# Patient Record
Sex: Female | Born: 1965 | Smoking: Former smoker
Health system: Northeastern US, Community
[De-identification: ages and names within clinical notes are randomized; demographics above are authoritative.]

## PROBLEM LIST (undated history)

## (undated) DIAGNOSIS — H524 Presbyopia: Secondary | ICD-10-CM

## (undated) DIAGNOSIS — E119 Type 2 diabetes mellitus without complications: Secondary | ICD-10-CM

## (undated) DIAGNOSIS — I10 Essential (primary) hypertension: Secondary | ICD-10-CM

## (undated) DIAGNOSIS — Z8679 Personal history of other diseases of the circulatory system: Secondary | ICD-10-CM

## (undated) DIAGNOSIS — H52203 Unspecified astigmatism, bilateral: Secondary | ICD-10-CM

## (undated) DIAGNOSIS — Z8639 Personal history of other endocrine, nutritional and metabolic disease: Secondary | ICD-10-CM

## (undated) HISTORY — PX: TOE SURGERY: SHX1073

## (undated) HISTORY — PX: OB ANTEPARTUM CARE CESAREAN DLVR & POSTPARTUM: REP299

## (undated) HISTORY — PX: LAPAROSCOPY FULGURATION OVIDUCTS: REP209

## (undated) HISTORY — DX: Presbyopia: H52.4

## (undated) HISTORY — DX: Unspecified astigmatism, bilateral: H52.203

## (undated) HISTORY — PX: TUBAL LIGATION: SHX77

## (undated) HISTORY — PX: PARATHYROIDECTOMY: SHX19

## (undated) HISTORY — PX: CARPAL TUNNEL RELEASE: 100041

---
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
## 2009-07-08 ENCOUNTER — Ambulatory Visit (HOSPITAL_BASED_OUTPATIENT_CLINIC_OR_DEPARTMENT_OTHER): Payer: PRIVATE HEALTH INSURANCE | Admitting: Advanced Practice Midwife

## 2009-07-08 VITALS — BP 158/100 | Wt 201.0 lb

## 2009-07-08 DIAGNOSIS — Z124 Encounter for screening for malignant neoplasm of cervix: Secondary | ICD-10-CM

## 2009-07-08 DIAGNOSIS — Z01419 Encounter for gynecological examination (general) (routine) without abnormal findings: Principal | ICD-10-CM

## 2009-07-08 DIAGNOSIS — Z1239 Encounter for other screening for malignant neoplasm of breast: Secondary | ICD-10-CM

## 2009-07-08 NOTE — Progress Notes (Signed)
Due to the language barrier, the office visit was conducted in Portuguese with an interpreter. The interpreter was present during the entire visit.

## 2009-07-08 NOTE — Progress Notes (Signed)
S:  44 y.o. Portuguese-speaking pt presents for AE.  Interpreter present throughout visit.    Feeling well.  Reports he last medical care was ~6 yrs ago.  Reports she has HAs before/after menses, relieved with Tylenol.  She has also had constant L knee pain x 1 yr, somewhat improved with Tylenol.  No clear aggravating factors.  Also periodically experiences numbness in hands, which is relieved by repositioning.      Hx reviewed; see Histories sections in EpicCare.  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 but now only L knee hurts, no meds  Surg hx: umbilical hernia, c/s x 2, BTL, no probs with anesthesia  Social hx: works as Financial trader, trying to work less, denies toxic habits  Nutrition: eats "junk food" because of work schedule  Exercise: none, blames her age, used to go to gym  Mood: fine, misses family and children in [place], sleeping OK, has fun dancing    G2 P2  LNMP March 2011, has monthly menses, no intramenstrual bleeding  No hx abnl pap, last remote  Contraception: had BTL  Relationship: stable, MM relationship x 3 years  STI screening: no concerns  DV: denies    ROS:  Constitutional: fatigue  Endocrine: negative  Dermatologic: negative  Cardiovascular: negative  Respiratory: negative  Gastrointestinal: negative  Genitourinary: negative  Musculoskeletal: joint pain  Neurological: negative  Psychiatric: negative    O:      Repeat BP 148/100    PHYSICAL EXAM:  Constitutional: well developed, well nourished [place] female  Skin: clear  Neurological: normal,alert and oriented  Lymphatic: no cervical, supraclavicular, axillary or inguinal adenopathy noted  Thyroid: not enlarged and no palpable masses  Chest: clear to auscultation  Heart: regular rate and rhythm and no murmur  Breasts: no masses, skin, nipple or axillary changes and nipples everted  Abdomen: no masses or tenderness, no palpable hepatosplenomegaly and soft,  non-tender    PELVIC:  External Genitalia: normal architecture and no lesions, no d/c  Vagina: well rugated,no lesions  Vaginal Discharge: normal appearing and non-odorous  Pelvic supports: normal  Cervix: no lesions, multiparous appearance and no cervical motion tenderness  Uterus: normal size, mobile and non-tender  Adnexa: not palpable    Extremities: normal, strength and ROM deferred  Anus and Perineum: normal  Rectum: deferred     A:  Well-woman for AE  Mildly hypertensive  Overweight  Needs PCP    P:  Pap, HPV, mammogram  Counseled re. Diet, weight, low-impact exercise, SBE  Needs to establish relationship with PCP  Refer to PCP for eval of UE neuro sx, HTN, knee pain  RTC 1 yr or prn    Donnald Garre C. Natale Milch, CNM

## 2009-07-14 LAB — CYTOPATH, C/V, THIN LAYER

## 2009-07-15 LAB — HUMAN PAPILLOMAVIRUS (HPV): HUMAN PAPILLOMAVIRUS: NEGATIVE

## 2009-07-25 ENCOUNTER — Ambulatory Visit (HOSPITAL_BASED_OUTPATIENT_CLINIC_OR_DEPARTMENT_OTHER): Payer: PRIVATE HEALTH INSURANCE | Admitting: Internal Medicine

## 2009-07-25 ENCOUNTER — Encounter (HOSPITAL_BASED_OUTPATIENT_CLINIC_OR_DEPARTMENT_OTHER): Payer: Self-pay | Admitting: Internal Medicine

## 2009-07-25 VITALS — BP 144/88 | HR 86 | Temp 98.3°F | Ht 62.99 in | Wt 205.0 lb

## 2009-07-25 DIAGNOSIS — I10 Essential (primary) hypertension: Secondary | ICD-10-CM

## 2009-07-25 DIAGNOSIS — Z0000 Encounter for general adult medical examination without abnormal findings: Principal | ICD-10-CM

## 2009-07-25 DIAGNOSIS — E669 Obesity, unspecified: Secondary | ICD-10-CM | POA: Insufficient documentation

## 2009-07-25 DIAGNOSIS — M25562 Pain in left knee: Secondary | ICD-10-CM

## 2009-07-28 LAB — EKG

## 2009-07-29 ENCOUNTER — Encounter (HOSPITAL_BASED_OUTPATIENT_CLINIC_OR_DEPARTMENT_OTHER): Payer: Self-pay | Admitting: Internal Medicine

## 2009-08-01 ENCOUNTER — Ambulatory Visit: Payer: Self-pay | Admitting: Advanced Practice Midwife

## 2009-08-04 ENCOUNTER — Ambulatory Visit (HOSPITAL_BASED_OUTPATIENT_CLINIC_OR_DEPARTMENT_OTHER): Payer: PRIVATE HEALTH INSURANCE | Admitting: Lab

## 2009-08-04 DIAGNOSIS — I10 Essential (primary) hypertension: Principal | ICD-10-CM

## 2009-08-04 LAB — MICROALBUMIN RANDOM URINE
ALB/CREAT RATIO URINE RAN: 10 ug/mg (ref 0–30)
ALBUMIN URINE RANDOM: 0.9 mg/dl (ref 0.0–1.9)
CREATININE RANDOM URINE: 86 mg/dl

## 2009-08-04 LAB — CHG LIPID PANEL
Cholesterol: 140 mg/dl (ref 0–200)
HIGH DENSITY LIPOPROTEIN: 31 mg/dl — ABNORMAL LOW (ref 35–85)
LOW DENSITY LIPOPROTEIN DIRECT: 93 mg/dl (ref 0–100)
RISK FACTOR: 4.5 — ABNORMAL HIGH (ref ?–4.4)
TRIGLYCERIDES: 99 mg/dl (ref 0–150)

## 2009-08-04 LAB — BASIC METABOLIC PANEL FASTING
ANION GAP: 7 mmol/L (ref 2–25)
BUN (UREA NITROGEN): 12 mg/dl (ref 6–20)
CALCIUM: 9 mg/dl (ref 8.6–10.3)
CARBON DIOXIDE: 24 mmol/L (ref 22–32)
CHLORIDE: 107 mmol/L (ref 101–111)
CREATININE: 0.5 mg/dl (ref 0.4–1.2)
ESTIMATED GLOMERULAR FILT RATE: >60 mL/min (ref >60)
GLUCOSE FASTING: 86 mg/dl (ref 74–118)
POTASSIUM: 3.9 mmol/L (ref 3.5–5.1)
SODIUM: 138 mmol/L (ref 135–144)

## 2009-08-04 LAB — HEMATOCRIT: HEMATOCRIT: 40.8 % (ref 36.0–48.0)

## 2009-08-04 NOTE — Progress Notes (Signed)
lab drawn 1 lav and 1 sst.

## 2009-08-05 ENCOUNTER — Encounter (HOSPITAL_BASED_OUTPATIENT_CLINIC_OR_DEPARTMENT_OTHER): Payer: Self-pay | Admitting: Internal Medicine

## 2009-08-05 ENCOUNTER — Telehealth (HOSPITAL_BASED_OUTPATIENT_CLINIC_OR_DEPARTMENT_OTHER): Payer: Self-pay | Admitting: Internal Medicine

## 2009-08-05 LAB — MA SCREENING MAMMO BILATERAL WITH CAD

## 2009-08-05 NOTE — Telephone Encounter (Signed)
This encounter was opened in error.  Please disregard.

## 2009-08-17 ENCOUNTER — Ambulatory Visit (HOSPITAL_BASED_OUTPATIENT_CLINIC_OR_DEPARTMENT_OTHER): Payer: PRIVATE HEALTH INSURANCE | Admitting: Internal Medicine

## 2009-08-30 ENCOUNTER — Ambulatory Visit (HOSPITAL_BASED_OUTPATIENT_CLINIC_OR_DEPARTMENT_OTHER): Payer: PRIVATE HEALTH INSURANCE

## 2011-10-13 ENCOUNTER — Ambulatory Visit (HOSPITAL_BASED_OUTPATIENT_CLINIC_OR_DEPARTMENT_OTHER): Payer: PRIVATE HEALTH INSURANCE | Admitting: Internal Medicine

## 2011-10-13 VITALS — BP 168/100 | HR 71 | Wt 210.0 lb

## 2011-10-13 DIAGNOSIS — I10 Essential (primary) hypertension: Principal | ICD-10-CM

## 2011-10-13 LAB — BASIC METABOLIC PANEL
ANION GAP: 6 mmol/L (ref 3–11)
BUN (UREA NITROGEN): 10 mg/dl (ref 6–20)
CALCIUM: 9.8 mg/dl (ref 8.6–10.3)
CARBON DIOXIDE: 22 mmol/L (ref 22–32)
CHLORIDE: 111 mmol/L (ref 101–111)
CREATININE: 0.6 mg/dl (ref 0.4–1.2)
ESTIMATED GLOMERULAR FILT RATE: >60 mL/min (ref >60)
Glucose Random: 95 mg/dl (ref 74–160)
POTASSIUM: 4.3 mmol/L (ref 3.5–5.1)
SODIUM: 139 mmol/L (ref 135–144)

## 2011-10-13 LAB — MICROALBUMIN RANDOM URINE
ALB/CREAT RATIO URINE RAN: 9 ug/mg (ref 0–30)
ALBUMIN URINE RANDOM: 0.3 mg/dl (ref 0.0–1.9)
CREATININE RANDOM URINE: 33 mg/dl

## 2011-10-13 MED ORDER — CHLORTHALIDONE 25 MG PO TABS
25.0000 mg | ORAL_TABLET | Freq: Every day | ORAL | Status: DC
Start: 2011-10-13 — End: 2011-12-21

## 2011-10-13 NOTE — Progress Notes (Signed)
Amanda Cervantes is a 46 year old female who presents with the following problems:    Pt noted to have high bp readings in gyn clinic 2.5 yr ago.  Seen in primary care 2 times around that time and bp was high. Told to check bp at home and tells me home bp readings but did not f/u. Recently BPs are 160/90 to 170 /100.  In last 2 mon started getting headaches, now daily ands now on nape of neck. She had normal BMP and EKG in 07/2009.    Denies chest pain, dyspnea, edema, or TIA's.    Denies fever, chills, weight loss or night sweats.    Encounter diagnoses:  Hypertension, essential  (primary encounter diagnosis)    Patient Active Problem List:     Obesity     Essential hypertension, benign     Left knee pain      No outpatient prescriptions have been marked as taking for the 10/13/11 encounter (Office Visit) with Etta Quill.  Review of Patient's Allergies indicates:   Penicillins                 Comment:Took skin 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 & was positive, never             actually took it  OBJECTIVE:  BP 168/100  Pulse 71  Wt 210 lb (95.255 kg)  BMI 37.21 kg/m2  SpO2 99%  She appears well, in no apparent distress.  Alert and oriented times three, pleasant and cooperative. Vital signs are as noted by the nurse.  Throat and pharynx normal. Neck supple. No adenopathy or masses in the neck or supraclavicular regions. Sinuses non tender.  S1 and S2 normal, no murmurs, clicks, gallops or rubs. Regular rate and rhythm. Chest is clear; no wheezes or rales. No edema or JVD.  The abdomen is soft without tenderness, guarding, mass, rebound or organomegaly. Bowel sounds are normal. No CVA tenderness or inguinal adenopathy noted.      Assessment and Plan:  (401.9) Hypertension, essential  (primary encounter diagnosis)  Comment: Elevated BP for several years.  Needs to start medication.  Will councel on weight loss and low salt diet in addition.  Plan: chlorthalidone (HYGROTEN) 25 MG tablet, BASIC         METABOLIC  PANEL, ALBUMIN URINE RANDOM              Follow up: F/u with PCP within 2 weeks for bp check and BMP    Medication reconciliation:   Current outpatient prescriptions:chlorthalidone (HYGROTEN) 25 MG tablet, Take 1 tablet by mouth daily., Disp: 30 tablet, Rfl: 2

## 2011-10-29 ENCOUNTER — Encounter (HOSPITAL_BASED_OUTPATIENT_CLINIC_OR_DEPARTMENT_OTHER): Payer: Self-pay | Admitting: Internal Medicine

## 2011-10-29 ENCOUNTER — Ambulatory Visit (HOSPITAL_BASED_OUTPATIENT_CLINIC_OR_DEPARTMENT_OTHER): Payer: PRIVATE HEALTH INSURANCE | Admitting: Internal Medicine

## 2011-10-29 VITALS — BP 120/82 | Temp 99.5°F | Wt 204.0 lb

## 2011-10-29 DIAGNOSIS — F17200 Nicotine dependence, unspecified, uncomplicated: Secondary | ICD-10-CM

## 2011-10-29 DIAGNOSIS — Z0000 Encounter for general adult medical examination without abnormal findings: Principal | ICD-10-CM

## 2011-10-29 DIAGNOSIS — Z23 Encounter for immunization: Secondary | ICD-10-CM

## 2011-10-29 DIAGNOSIS — E669 Obesity, unspecified: Secondary | ICD-10-CM

## 2011-10-29 DIAGNOSIS — I10 Essential (primary) hypertension: Secondary | ICD-10-CM

## 2011-10-29 LAB — CHG LIPOPROTEIN DIRECT MEASUREMENT LDL CHOLESTEROL: LOW DENSITY LIPOPROTEIN DIRECT: 101 mg/dl — ABNORMAL HIGH (ref 0–100)

## 2011-10-29 LAB — CHG CREATININE BLOOD: CREATININE: 0.9 mg/dl (ref 0.4–1.2)

## 2011-10-29 LAB — CHOLESTEROL: Cholesterol: 162 mg/dl (ref 0–200)

## 2011-10-29 LAB — POTASSIUM: POTASSIUM: 3.7 mmol/L (ref 3.5–5.1)

## 2011-10-29 LAB — CHG LIPOPROTEIN DIR MEAS HIGH DENSITY CHOLESTEROL: HIGH DENSITY LIPOPROTEIN: 31 mg/dl — ABNORMAL LOW (ref 35–85)

## 2011-10-29 LAB — BUN (UREA NITROGEN): BUN (UREA NITROGEN): 16 mg/dl (ref 6–20)

## 2011-10-29 NOTE — Progress Notes (Addendum)
10/29/2011  VIS given prior to administration and reviewed with the patient and or legal guardian. Patient understands the disease and the vaccine. See immunization/Injection module or chart review for date of publication and additional information. Warning sign reviewed . Patient denies questions  Altamese Carolina RN

## 2011-10-29 NOTE — Addendum Note (Signed)
Addended by: Ashok Pall on: 10/29/2011 03:25 PM     Modules accepted: Orders, SmartSet

## 2011-10-29 NOTE — Progress Notes (Signed)
HTN:  See EPIC Problem List for HPI.  She reports taking the hypertension medications as on her EPIC med list below.  She reports no difficulty with compliance or side-effects altho' she did feel a little dizzy the first 2 days she started chlorthalidone --since then feeling great  She denies chest pain, dyspnea, headache, lower extremity edema or localized weakness or paresthesias  Her last three blood pressures in clinic were:  Most Recent BP Reading(s)  10/29/11 : 120/82  10/13/11 : 168/100  07/25/09 : 144/88  Her Most Recent Weight Reading(s)  10/29/11 : 204 lb (92.534 kg)  10/13/11 : 210 lb (95.255 kg)  07/25/09 : 205 lb (92.987 kg)  07/08/09 : 201 lb (91.173 kg)    last potassium was   POTASSIUM (mmol/L)   Date  Value    10/13/2011  4.3    ----------  last creatinine was   CREATININE (mg/dl)   Date  Value    1/61/0960  0.6    ----------    tobac   Quit smoking 2 wks ago when she learned she had HTN    Review of symptoms:     No fevers or unexplained weight loss. No visual changes. No sore throat or ear ache.  No prolonged cough. No dyspnea or chest pain on exertion.  No abdominal pain or change in bowel habits.  No vaginal discharge or dysuria.  No new or unusual musculoskeletal symptoms. No new rashes or skin changes.  No pain or masses in breasts. No paresthesias or unusual headaches. No sadness or anxiety that interferes with day-to-day activities. No hot flushes. No enlarged nodes.  No new itching, sneezing or wheezing.    Patient Active Problem List    Tobacco dependence         Priority: Medium [2]         Date Noted: 10/29/2011            Quit 7/13      Essential hypertension, benign         Priority: Medium [2]         Date Noted: 07/25/2009            BMP, HCT, microalb, EKG wnl 2011; Treatment            started 10/13/11            Well controlled with chlorthalidone       Obesity         Date Noted: 07/25/2009            Took compounded diet pills from [place] -- in 2009,            took  for 30 days        Current Outpatient Prescriptions on File Prior to Visit:  chlorthalidone (HYGROTEN) 25 MG tablet Take 1 tablet by mouth daily. Disp: 30 tablet Rfl: 2     No current facility-administered medications on file prior to visit.  Review of Patient's Allergies indicates:   Penicillins                 Comment:Took skin 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 & was positive, never             actually took it  No past medical history on file.      Past Surgical History    TUBAL LIGATION       Social History    Marital Status: Legally Separated   Spouse Name:  Years of Education:                 Number of children:               Social History Main Topics    Smoking Status: Former Smoker                   Packs/Day: 0.33  Years: 10        Types: Cigarettes    Comment: quit 2 weeks ago on 10/16/11    Alcohol Use: Yes                Comment: minimal    Drug Use: No              Sexual Activity: Yes               Partners with: Female       Birth Control/Protection: Tubal Ligation    Social History Narrative    she is originally from [place] ,[place].  She came to the [place] in 2005     In [place], she worked as a Theatre manager.     In [place], she works Land    She lives with boyfriend & daughter; no DV;      she has 2 children, age 50 y/o son in [place] & 69 y/o daughter lives with her        Family History    Heart Disease Father     Comment: died age 8 from MI; learned about HTN in 76's    Cancer - Other Mother     Comment: stomach cancer, died    GI Sister     Comment: hernia    BP 120/82  Temp(Src) 99.5 F (37.5 C) (Oral)  Wt 204 lb (92.534 kg)  BMI 36.15 kg/m2  General:Patient appears well, alert and oriented x 3, pleasant, cooperative.   Eyes: anicteric conjunctiva, PERLA bilaterally.   Ears: tm's wnl bilaterally without erythema or exudate.   Oropharynx: no erythema or exudate.   Neck supple and free of adenopathy, or masses. No thyromegaly.  Lymph: no enlargement of anterior cervical,  posterior cervical or supraclavicular lymph nodes   Cardiovascular: regular rate & rhythm, no murmurs, gallops or rubs appreciated.   Chest: clear to auscultation bilaterally, no crackles, rhonchi or wheezes.   Abdomen is obese soft, no tenderness, masses or organomegaly.  Extremities: no clubbing, cyanosis or edema. Peripheral pulses are normal bilaterally.   Skin is normal without suspicious lesions noted.  Mental status exam: Normal thought content, speech, affect, mood and dress are noted.  Musculoskeletal exam: within normal limits without joint swelling or deformities  Screening neurological exam is normal without focal findings.     ASSESSMENT & PLAN:  (V70.0) Routine general medical examination at a health care facility  (primary encounter diagnosis)  Comment: We discussed the pros and cons of screening mammo -- we discussed that there are real benefits in terms of decrease risk of dying from breast cancer but there's a cost which is many procedures & treatments, many of which might not be necessary. So, if she wishes to do everything to decrease her risk of dying from breast cancer -- then I would recommend a mammogram. But she should not feel guilty if she decided not to pursue mammograms at this time.  We also discussed the option of performing mammograms q37yr or q75yr and that she can change her mind at any time in  the future.  After detailed discussion & answering all her questions, she decided to not to check mammo now -- for now will wait till 46 years old -- but may change her mind at any time.   Plan: HEMOGLOBIN A1C, CHOLESTEROL, HIGH DENSITY         LIPOPROTEIN, LOW DENSITY LIPOPROTEIN,DIRECT        Does not need repeat Pap this year    (401.1) Essential hypertension, benign  Comment:she was lightheaded for 2 days but only at the start, likely adjusting to the thiazide; we reviewed in detail the symptoms that would suggest low BP & that we might need to cut back on her regimen; for now, well  controlled continue current regimen, will check  Plan: POTASSIUM, BUN (UREA NITROGEN), CREATININE         BLOOD        RN BP check in 6 weeks   Key is weight loss & tobac cessation    tobac  Strongly recommended that she quit tobacco as the most important thing she can do for her health.  We discussed the pros and cons of smoking.  I explained that once she decides on a date she will stop then I can provide her with many options to help with the symptoms of nicotine withdrawal she might experience when quitting. We spent between 3-5 minutes discussing tobacco abuse.     (278.00) Obesity  Comment: discussed basic principles of dieting today including:  - minimizing eating fried foods  - keeping portion sizes small, especially in respect to pasta, breads & desserts  - eating a lot of vegetables while cooking these in only a little bit of oil  - getting regular exercise, ideally 30 minutes on most days of the week  - reviewed that meds are not recommended as they often have side effects and she will gain the weight again after stopping them   Plan: REFERRAL TO NUTRITION ( INT) NON-DIABETES              follow-up will be scheduled for 6 weeks from now with RN  she has been advised to call or return with any worsening or new problems

## 2011-10-30 LAB — HEMOGLOBIN A1C
ESTIMATED AVERAGE GLUCOSE: 126 (ref 74–160)
HEMOGLOBIN A1C: 6 % — ABNORMAL HIGH (ref 4.0–5.6)

## 2011-10-31 ENCOUNTER — Encounter (HOSPITAL_BASED_OUTPATIENT_CLINIC_OR_DEPARTMENT_OTHER): Payer: Self-pay | Admitting: Internal Medicine

## 2011-10-31 ENCOUNTER — Telehealth (HOSPITAL_BASED_OUTPATIENT_CLINIC_OR_DEPARTMENT_OTHER): Payer: Self-pay | Admitting: Internal Medicine

## 2011-10-31 DIAGNOSIS — E119 Type 2 diabetes mellitus without complications: Secondary | ICD-10-CM | POA: Insufficient documentation

## 2011-10-31 DIAGNOSIS — E1165 Type 2 diabetes mellitus with hyperglycemia: Secondary | ICD-10-CM

## 2011-10-31 NOTE — Progress Notes (Signed)
Hi Barb,     Please call pt to let her know that I received her lab tests.    Her HgA1c is slightly high -- not in diabetes mellitus range but may get there in the next 5 years - so her not smoking & losing weight is key    She might benefit from review of some basic diet advice:    - minimizing eating fried foods  - keeping portion sizes small, especially in respect to pasta, breads & desserts  - eating a lot of vegetables while cooking these in only a little bit of oil  - getting regular exercise, ideally 30 minutes on most days of the week  - review that meds for weight loss as well as crash diets are not recommended as they often have side effects and she will gain the weight again after stopping them     Also, if she is interested in seeing RD please have her schedule this.     Thanks, [employer]

## 2011-11-01 NOTE — Progress Notes (Signed)
Left message in portuguese for patient to call [phone].

## 2011-11-02 NOTE — Progress Notes (Signed)
Spoke with patient informed her about lab as per provider's instruction below. Patient states she stopped smoking and she is trying to eat healthy. She is eating more vegetable and fruit. Reviewed use of small size when comes to portions for pasta and bread, walk as a good activity especially 30 minute 3 or more times a week. Patient verbalize understanding and she agreed to see RD for additional Nutrition support.  Appointment schedule for 8/27 with RD. Patient to call if any questions in the future

## 2011-11-05 ENCOUNTER — Ambulatory Visit (HOSPITAL_BASED_OUTPATIENT_CLINIC_OR_DEPARTMENT_OTHER): Payer: PRIVATE HEALTH INSURANCE | Admitting: Internal Medicine

## 2011-11-13 ENCOUNTER — Encounter (HOSPITAL_BASED_OUTPATIENT_CLINIC_OR_DEPARTMENT_OTHER): Payer: Self-pay | Admitting: Registered"

## 2011-11-13 ENCOUNTER — Ambulatory Visit (HOSPITAL_BASED_OUTPATIENT_CLINIC_OR_DEPARTMENT_OTHER): Payer: PRIVATE HEALTH INSURANCE | Admitting: Registered"

## 2011-11-13 VITALS — BP 140/90 | Temp 98.6°F | Wt 198.0 lb

## 2011-11-13 DIAGNOSIS — R7303 Prediabetes: Principal | ICD-10-CM

## 2011-11-13 DIAGNOSIS — E669 Obesity, unspecified: Secondary | ICD-10-CM

## 2011-11-13 NOTE — Progress Notes (Signed)
INITIAL NUTRITION ASSESSMENT / INTERVENTION    Beginning Time: 9 AM  End Time: 10 AM     Total Minutes: 60 minutes    Amanda Cervantes is a 46 year old female who presents with pre diabetes and obesity.  Visit via [place] interpreter, Synetta Fail, via phone.  The patient is from [place]. Ethnicity: [place].  Patient's language is [place] and [place]. Patient able to read [place] and [place].  She has been in the [place] for 9 years.  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  Patient lives with boyfriend and daughter (54 yo).     Subjective (including lifestyle changes): Patient reviews she has changed her diet and exercise int he last month since learning of her diagnosis. Patient reviews she is eating smaller portions of starches, choosing low fat proteins, and focusing on vegetables. Patient reviews she has a Systems analyst she sees 3x/week for 60+ minutes. Patient reports the gym she goes to gave her a list of foods to avoid and to eat for weight loss. Patient provided a list - these are "clean eating" foods that are low glycemic and lean proteins.         Family History    Heart Disease Father     Comment: died age 65 from MI; learned about HTN in 33's    Cancer - Other Mother     Comment: stomach cancer, died    GI Sister     Comment: hernia     Patient has had pre DM for 1-6 months and has not had Medical Nutrition Therapy before today.      Current Outpatient Prescriptions:  chlorthalidone (HYGROTEN) 25 MG tablet Take 1 tablet by mouth daily. Disp: 30 tablet Rfl: 2     No current facility-administered medications for this visit.    Vitamins/Minerals/Herbal Supplements: None       Smoking status: Former Smoker  0.33 Packs/Day  For 10.00 Years     Types: Cigarettes    Smokeless tobacco:     Comment:     Alcohol Use: Yes    Comment: minimal       Review of Patient's Allergies indicates:   Penicillins                 Comment:Took skin 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 & was positive,  never             actually took it    Labs reviewed: Yes  (For list of labs type "dot" LLNUTA)    Vitals:  Most Recent BP Reading(s)  11/13/11 : 140/90      Most Recent Height Reading(s)  07/25/09 : 5' 2.99" (1.6 m)      Most Recent Weight Reading(s)  11/13/11 : 198 lb (89.812 kg)  10/29/11 : 204 lb (92.534 kg)  10/13/11 : 210 lb (95.255 kg)  07/25/09 : 205 lb (92.987 kg)  07/08/09 : 201 lb (91.173 kg)     Weight change: Lost 12 pounds in the past 1 month(s).    Estimated body mass index is 35.08 kg/(m^2) as calculated from the following:    Height as of 07/25/09: 5' 2.99" (1.6 m).    Weight as of this encounter: 198 lb (89.812 kg).  BMI Category: 35-39.9 obesity class II    Highest weight: 210 lb (July 2013)       Lowest weight: not reviewed       Desired weight: 145 lbs  Physical Activity:  Type: goes to the gym for 60 minutes, 3x/week. Works with a Systems analyst for 60 minutes. Does 15 minutes of cardio before working with the trainer    Barriers to physical activity include: none    Activities of Daily Living: Independent    Chewing ability: no concerns   Swallowing ability: no concerns   Appetite: Good    Food Allergies: No known allergies  Food Intolerances: None       Religious restrictions/Special diet: None  Food purchased by: self, boyfriend, daughter       Food prepared by: self, boyfriend, daughter  Meal sources: home cooked and take out / restaurant, 1 times per month  Economic factors and food assist: declined  Psychosocial factors / Mental Status: interactive with questions  Stress: Fair  Readiness to learn: Ready    Dietary history / usual intake reveals patient follows reduced calories, low carbohydrate and low fat diet.  She eats 3 meals and 2-3 snacks per day.    Wake at 5:30   Meal (6:30am) - 3-4 pieces of light toast + 1 piece light cheese; coffee with milk (skim) + artificial sweetener   Snack (9:30am) - fruit    Meal (12pm) - 1 c brown rice; vegetables; chicken or [place]   Snack  (3:30pm) - light yogurt and/or fruit    Meal (6pm) - salad loaded with vegetables topped with a EVOO, little lemon, and salt; fish or chicken; 1/2 c rice  Modifiers - drinks lots of water. No juice or soda or alcohol. Will occasionally have a beer with friends once in a while, but very rarely.  Eats grilled fish (2-3x/wk), chicken (2-3x/wk), or beef (0-1x/wk). Makes all food in foreman grill. Changed diet, have been eating like this for 3 weeks.     Portion size is appropriate.  Eating frequency is appropriate  Eating habits are culturally based [place] and based upon patient's efforts to lose weight    Estimated intake shows:   sub-optimal nutrients: monosaturated fats  excessive nutrients: None    Assessment/Plan/Conclusion:  Congratulated patient on weight loss and changes she has made to her diet and lifestyle!     Reviewed with patient pathophys. of DM II. Discussed the impact of CHO on BS levels. Reviewed dietary sources of CHO. Discussed portion control of CHO using the plate method.Discussed methods to reduce BS and improve diabetes (exercise, weight loss, portion control, less animal fat, no smoking). Advised patient to not drink juice, soda, or sugary beverages. Reviewed that patient is working on all of these steps.     Discussed with patient basic principles of weight loss - calories needed to maintain, lose, and/or gain weight. Discussed healthy rate of weight loss through diet and exercise (.5-1lb/wk). Reviewed calorie and exercise needs to lose weight at this rate (1500 kcal to lose 1/2 lb per week). Reviewed ways to modify diet to achieve calorie deficit.     Reviewed calorie density of select foods and the value of fruits and vegetables in achieving weight loss. Reviewed effect of volume on hunger levels. Encouraged use of fruits and vegetables to add volume to meals without increasing calories. Reviewed the plate method as a way to balance meals and portion food without measuring.     Reviewed  the role of fiber, protein, and fat in satiety during meals. Discussed the importance of eating every 4 hours to avoid becoming overly hungry.     Discussed the importance of being consistent in weight  loss efforts in order to achieve long term weight loss (e.g., do not "undo" weight loss efforts by overeating / indulging on the weekends).     Discussed the importance of regular cardio and strength exercises to supplement dietary changes. Congratulated patient on current strength training routine!!    Barriers to dietary changes include: none    Client's goals:   KEEP IT UP!  1. Increase vegetables - use the plate method  2. Exercise - cardio and strength regularly    Material provided:thumbs up for healthy food choices; plate method; diabetes information    Future educational needs: sodium    Referred to: None    Follow-up: 3 months.    Panzer,Khadar Monger [place], RD, LD

## 2011-12-10 ENCOUNTER — Ambulatory Visit (HOSPITAL_BASED_OUTPATIENT_CLINIC_OR_DEPARTMENT_OTHER): Payer: PRIVATE HEALTH INSURANCE | Admitting: Ambulatory Care

## 2011-12-11 ENCOUNTER — Ambulatory Visit: Payer: Self-pay | Admitting: Internal Medicine

## 2011-12-21 ENCOUNTER — Encounter (HOSPITAL_BASED_OUTPATIENT_CLINIC_OR_DEPARTMENT_OTHER): Payer: Self-pay | Admitting: Ambulatory Care

## 2011-12-21 ENCOUNTER — Ambulatory Visit (HOSPITAL_BASED_OUTPATIENT_CLINIC_OR_DEPARTMENT_OTHER): Payer: PRIVATE HEALTH INSURANCE | Admitting: Ambulatory Care

## 2011-12-21 ENCOUNTER — Other Ambulatory Visit (HOSPITAL_BASED_OUTPATIENT_CLINIC_OR_DEPARTMENT_OTHER): Payer: Self-pay | Admitting: Ambulatory Care

## 2011-12-21 VITALS — BP 122/78 | HR 58

## 2011-12-21 DIAGNOSIS — I10 Essential (primary) hypertension: Principal | ICD-10-CM

## 2011-12-21 DIAGNOSIS — Z23 Encounter for immunization: Secondary | ICD-10-CM

## 2011-12-21 MED ORDER — CHLORTHALIDONE 25 MG PO TABS
25.0000 mg | ORAL_TABLET | Freq: Every day | ORAL | Status: DC
Start: 2011-12-21 — End: 2012-05-12

## 2011-12-21 NOTE — Progress Notes (Signed)
Visit today 122/78, 17

## 2011-12-21 NOTE — Progress Notes (Signed)
12/21/2011  VIS given prior to administration and reviewed with the patient and or legal guardian. Patient understands the disease and the vaccine. See immunization/Injection module or chart review for date of publication and additional information.  Windell Moulding, RN    Patient here for BP check- seen with tel interpreter Tresa Endo. Brought chlorthalidone 25mg  takes one daily. Needs refill, will forward to pcp sep encounter to sign off. BP today 122/78, 58, states stopped smoking one month ago. Trying to get exercise and trying to decrease carbs. Discussed   HEMOGLOBIN A1C (%)   Date  Value    10/29/2011  6.0*   Reiterated re lifestyle changes and praised for stopping smoking most importantly.

## 2012-01-04 ENCOUNTER — Other Ambulatory Visit (HOSPITAL_BASED_OUTPATIENT_CLINIC_OR_DEPARTMENT_OTHER): Payer: Self-pay | Admitting: Internal Medicine

## 2012-01-09 ENCOUNTER — Telehealth (HOSPITAL_BASED_OUTPATIENT_CLINIC_OR_DEPARTMENT_OTHER): Payer: Self-pay | Admitting: Internal Medicine

## 2012-01-09 NOTE — Progress Notes (Signed)
Call pharmacy confirm refills on file

## 2012-01-09 NOTE — Progress Notes (Signed)
[facility] INTERNAL MED    Person calling on behalf of patient: Patient (self)    May list multiple medications in this section    Medicine Name: Chlorthalidone    Dosage: 25mg     Frequency (how many pills, how many times a day):     Number of pills left:     Documented patient preferred pharmacies:     [facility]  Phone: [phone] Fax: [phone]      Patient's language of care: [place]

## 2012-05-05 ENCOUNTER — Ambulatory Visit (HOSPITAL_BASED_OUTPATIENT_CLINIC_OR_DEPARTMENT_OTHER): Payer: PRIVATE HEALTH INSURANCE | Admitting: Internal Medicine

## 2012-05-07 ENCOUNTER — Ambulatory Visit (HOSPITAL_BASED_OUTPATIENT_CLINIC_OR_DEPARTMENT_OTHER): Payer: PRIVATE HEALTH INSURANCE | Admitting: Internal Medicine

## 2012-05-12 ENCOUNTER — Encounter (HOSPITAL_BASED_OUTPATIENT_CLINIC_OR_DEPARTMENT_OTHER): Payer: Self-pay | Admitting: Internal Medicine

## 2012-05-12 ENCOUNTER — Ambulatory Visit (HOSPITAL_BASED_OUTPATIENT_CLINIC_OR_DEPARTMENT_OTHER): Payer: PRIVATE HEALTH INSURANCE | Admitting: Internal Medicine

## 2012-05-12 VITALS — BP 128/85 | HR 100 | Temp 98.5°F | Wt 200.0 lb

## 2012-05-12 DIAGNOSIS — M25562 Pain in left knee: Principal | ICD-10-CM

## 2012-05-12 DIAGNOSIS — F17200 Nicotine dependence, unspecified, uncomplicated: Secondary | ICD-10-CM

## 2012-05-12 DIAGNOSIS — E669 Obesity, unspecified: Secondary | ICD-10-CM

## 2012-05-12 DIAGNOSIS — I10 Essential (primary) hypertension: Secondary | ICD-10-CM

## 2012-05-12 DIAGNOSIS — R7303 Prediabetes: Secondary | ICD-10-CM

## 2012-05-12 MED ORDER — CHLORTHALIDONE 25 MG PO TABS
25.0000 mg | ORAL_TABLET | Freq: Every day | ORAL | Status: DC
Start: 2012-05-12 — End: 2013-01-03

## 2012-05-12 NOTE — Patient Instructions (Signed)
I recommended patient go to a good pharmacy, such as [facility] at [facility], and purchase a soft elbow brace to help keep your arms straight when you sleep at night

## 2012-05-12 NOTE — Progress Notes (Signed)
Cc: several issues    left knee pain  For 10 years or so has had pain in her left knee  Pain worse with going up or down stairs  No swelling or redness  She recalls slipping once in [place] & hitting her knee but she doesn't recall which one  This is worse when she's not working out & exercising   Better when she worked recently with a Systems analyst & worked on strengthening her muscles -- but then she stopped for the summer & that led to worsening    HTN/pre-diabetes:  See EPIC Problem List for HPI.  She reports taking the hypertension medications as on her EPIC med list below.  She reports no difficulty with compliance or side-effects  She denies chest pain, dyspnea, headache, lower extremity edema or localized weakness or paresthesias  Her last three blood pressures in clinic were:  Most Recent BP Reading(s)  05/12/12 : 128/85  12/21/11 : 122/78  11/13/11 : 140/90  Her Most Recent Weight Reading(s)  05/12/12 : 200 lb (90.719 kg)  11/13/11 : 198 lb (89.812 kg)  10/29/11 : 204 lb (92.534 kg)  10/13/11 : 210 lb (95.255 kg)    last potassium was   POTASSIUM (mmol/L)   Date  Value    10/29/2011  3.7    ----------  last creatinine was   CREATININE (mg/dl)   Date  Value    1/91/4782  0.9    ----------    TOBAC  She has stopped smoking regularly since late last year  But she continues to smoke when she goes out on weekends with her friends -- last smoked two weeks ago     Social History Narrative    she is originally from [place] ,[place].  She came to the [place] in 2005     In [place], she worked as a Theatre manager.     In [place], she works Land    She lives with boyfriend & daughter; no DV;      she has 2 children, age 12 y/o son in [place] & 77 y/o daughter lives with her        ROS: No fevers or unexplained weight loss. No new headaches. No shortness of breath or chest pain.     BP 128/85  Pulse 100  Temp(Src) 98.5 F (36.9 C) (Oral)  Wt 200 lb (90.719 kg)  BMI 35.44 kg/m2  LMP  04/13/2012  MMSE: well groomed, affect normal, no tangential thoughts, no pressured speech, no agitation or psychomotor slowing, normal response-time to questions  Neuro: alert & oriented x 3, no dysarthria or aphasia, gait wnl    Knee exam: full range of motion, no pain on motion, no effusion, tenderness, masses, ligamentous instability or deformity noted.   Heart: S1 and S2 normal, no murmurs, clicks, gallops or rubs. Regular rate and rhythm.   Lungs:  clear; no wheezes, rhonchi or rales.     ASSESSMENT & PLAN:  (401.9) Hypertension, essential  (primary encounter diagnosis)  Comment: BP well controlled, will continue chlorthalidone   Plan: chlorthalidone (HYGROTEN) 25 MG tablet            (790.29) Pre-diabetes  Comment: we had a detailed discussion of significant of elevated HgA1c of 6% but not in DM range -- she understands that lifestyle changes are key to prevent DM in the future -- focusing on healthy changes for weight loss as well as increased exercise   She will return prior to  PE in Aug to check: HEMOGLOBIN A1C, COLLECTION VENOUS BLOOD         VENIPUNCTURE            (305.1) Tobacco dependence  Comment:Strongly recommended that she quit tobacco as the most important thing she can do for her health.  We discussed the pros and cons of smoking.  I explained that once she decides on a date she will stop then I can provide her with many options to help with the symptoms of nicotine withdrawal she might experience when quitting. We spent between 3-5 minutes discussing tobacco abuse.   , I have recommended use of nicotine gum at 4 mg dose.  the gum is for strong cravings and can be used a few times a day without problems even while she is wearing the patch   I explained how to properly chew the gum. Start chewing until feels a tingling feeling then put the gum in her cheek without chewing.  When the tingling stops, chew again and then park the gum again in her cheek.  Stop if experience any chest pain.  may cause  unusual dreams and insomnia.  Call clinic with any questions.      (278.00) Obesity  Comment: discussed basic principles of dieting today including:  - minimizing eating fried foods  - keeping portion sizes small, especially in respect to pasta, breads & desserts  - eating a lot of vegetables while cooking these in only a little bit of oil  - getting regular exercise, ideally 30 minutes on most days of the week  - reviewed that meds are not recommended as they often have side effects and she will gain the weight again after stopping them       (719.46) Left knee pain  Comment: given hx of 10 yrs of pain, benign exam & improvement with strengthening - most likely musculoskeletal & strongly recommended returning to knee strengthening exercises         follow-up will be scheduled for 6 months from now  she has been advised to call or return with any worsening or new problems

## 2012-11-06 ENCOUNTER — Ambulatory Visit (HOSPITAL_BASED_OUTPATIENT_CLINIC_OR_DEPARTMENT_OTHER): Payer: PRIVATE HEALTH INSURANCE | Admitting: Lab

## 2012-11-06 DIAGNOSIS — R7303 Prediabetes: Principal | ICD-10-CM

## 2012-11-06 NOTE — Progress Notes (Signed)
Labs drawn

## 2012-11-07 LAB — HEMOGLOBIN A1C
ESTIMATED AVERAGE GLUCOSE: 134 (ref 74–160)
HEMOGLOBIN A1C: 6.3 % — ABNORMAL HIGH (ref 4.0–5.6)

## 2012-11-10 ENCOUNTER — Ambulatory Visit (HOSPITAL_BASED_OUTPATIENT_CLINIC_OR_DEPARTMENT_OTHER): Payer: PRIVATE HEALTH INSURANCE | Admitting: Internal Medicine

## 2012-11-27 ENCOUNTER — Ambulatory Visit (HOSPITAL_BASED_OUTPATIENT_CLINIC_OR_DEPARTMENT_OTHER): Payer: PRIVATE HEALTH INSURANCE | Admitting: Internal Medicine

## 2013-01-02 ENCOUNTER — Telehealth (HOSPITAL_BASED_OUTPATIENT_CLINIC_OR_DEPARTMENT_OTHER): Payer: Self-pay | Admitting: Ambulatory Care

## 2013-01-03 ENCOUNTER — Encounter (HOSPITAL_BASED_OUTPATIENT_CLINIC_OR_DEPARTMENT_OTHER): Payer: Self-pay | Admitting: Internal Medicine

## 2013-01-03 ENCOUNTER — Ambulatory Visit (HOSPITAL_BASED_OUTPATIENT_CLINIC_OR_DEPARTMENT_OTHER): Payer: PRIVATE HEALTH INSURANCE | Admitting: Internal Medicine

## 2013-01-03 VITALS — BP 140/82 | HR 88 | Temp 98.3°F | Wt 204.0 lb

## 2013-01-03 DIAGNOSIS — I10 Essential (primary) hypertension: Secondary | ICD-10-CM

## 2013-01-03 DIAGNOSIS — M25569 Pain in unspecified knee: Principal | ICD-10-CM

## 2013-01-03 MED ORDER — CHLORTHALIDONE 25 MG PO TABS
25.0000 mg | ORAL_TABLET | Freq: Every day | ORAL | Status: DC
Start: 2013-01-03 — End: 2013-02-20

## 2013-01-03 MED ORDER — IBUPROFEN 600 MG PO TABS
600.0000 mg | ORAL_TABLET | Freq: Three times a day (TID) | ORAL | Status: DC | PRN
Start: 2013-01-03 — End: 2013-01-03

## 2013-01-05 ENCOUNTER — Ambulatory Visit (HOSPITAL_BASED_OUTPATIENT_CLINIC_OR_DEPARTMENT_OTHER): Payer: PRIVATE HEALTH INSURANCE | Admitting: Internal Medicine

## 2013-01-06 ENCOUNTER — Encounter (HOSPITAL_BASED_OUTPATIENT_CLINIC_OR_DEPARTMENT_OTHER): Payer: Self-pay | Admitting: Internal Medicine

## 2013-01-07 ENCOUNTER — Encounter (HOSPITAL_BASED_OUTPATIENT_CLINIC_OR_DEPARTMENT_OTHER): Payer: Self-pay | Admitting: Internal Medicine

## 2013-01-07 ENCOUNTER — Telehealth (HOSPITAL_BASED_OUTPATIENT_CLINIC_OR_DEPARTMENT_OTHER): Payer: Self-pay | Admitting: Internal Medicine

## 2013-01-07 NOTE — Progress Notes (Signed)
Hi Barb,     Please call pt to let her know that I received her lab tests.    Her HgA1c is creeping upwards (apologize that we're just getting her Aug results back to her now)  At this rate, she may develop diabetes mellitus in next 1-2 years    Essential to apply lifestyle changes & lose a bit of weight     She might benefit from review of some basic diet advice:    - minimizing eating fried foods  - keeping portion sizes small, especially in respect to pasta, breads & desserts  - eating a lot of vegetables while cooking these in only a little bit of oil  - getting regular exercise, ideally 30 minutes on most days of the week  - review that meds for weight loss as well as crash diets are not recommended as they often have side effects and she will gain the weight again after stopping them     Also, if she is interested in seeing RD please have her schedule this. \    Thanks, [employer]

## 2013-01-15 ENCOUNTER — Ambulatory Visit (HOSPITAL_BASED_OUTPATIENT_CLINIC_OR_DEPARTMENT_OTHER): Payer: PRIVATE HEALTH INSURANCE | Admitting: Internal Medicine

## 2013-01-15 ENCOUNTER — Encounter (HOSPITAL_BASED_OUTPATIENT_CLINIC_OR_DEPARTMENT_OTHER): Payer: Self-pay | Admitting: Internal Medicine

## 2013-01-15 VITALS — BP 124/72 | HR 61 | Temp 98.3°F | Wt 203.0 lb

## 2013-01-15 DIAGNOSIS — M79605 Pain in left leg: Secondary | ICD-10-CM | POA: Insufficient documentation

## 2013-01-15 DIAGNOSIS — E669 Obesity, unspecified: Secondary | ICD-10-CM

## 2013-01-15 DIAGNOSIS — Z23 Encounter for immunization: Secondary | ICD-10-CM

## 2013-01-15 DIAGNOSIS — M545 Low back pain, unspecified: Secondary | ICD-10-CM

## 2013-01-15 DIAGNOSIS — I10 Essential (primary) hypertension: Secondary | ICD-10-CM

## 2013-01-15 DIAGNOSIS — M79604 Pain in right leg: Secondary | ICD-10-CM

## 2013-01-15 DIAGNOSIS — R7303 Prediabetes: Secondary | ICD-10-CM

## 2013-01-15 DIAGNOSIS — F17200 Nicotine dependence, unspecified, uncomplicated: Secondary | ICD-10-CM

## 2013-01-15 DIAGNOSIS — M25569 Pain in unspecified knee: Secondary | ICD-10-CM

## 2013-01-15 DIAGNOSIS — Z0000 Encounter for general adult medical examination without abnormal findings: Principal | ICD-10-CM

## 2013-01-15 LAB — BUN (UREA NITROGEN): BUN (UREA NITROGEN): 17 mg/dL (ref 7–18)

## 2013-01-15 LAB — POTASSIUM: POTASSIUM: 3.2 mmol/L — ABNORMAL LOW (ref 3.5–5.1)

## 2013-01-15 LAB — CREATININE: CREATININE: 0.8 mg/dL (ref 0.4–1.2)

## 2013-01-15 MED ORDER — IBUPROFEN 600 MG PO TABS
600.0000 mg | ORAL_TABLET | Freq: Three times a day (TID) | ORAL | Status: DC | PRN
Start: 2013-01-15 — End: 2013-02-20

## 2013-01-15 NOTE — Progress Notes (Signed)
right leg pain & lower back pain  She notes that for about 6 months her leg has been bothering her  She notes that her knee is painful & especially behind her knee  She notes that sometimes the pain radiates up into her thigh  She had been walking >30 min / day but stopped due to the pain  She also had been working but took 2 weeks off of work to rest her leg because it was painful going up & down stairs  Worse with activity, but sometimes she feels it at night as well  Saw Dr. Conni Slipper, was started on ibuprofen which helped - -in fact, she reports that at the time she had anterior knee pain which has since resolved; she stopped taking the ibuprofen after 5 days  In addition, he referred her to PT but she did not go  She's noted no paresthesias or weakness of her extremities  Her lower back pain has appeared over the past 2 weeks  Worse with bending/lifting  Not associated with any symptoms including no urinary or bowel incontinence, fevers, or other systemic symptoms     HTN:  See EPIC Problem List for HPI.  She reports taking the hypertension medications as on her EPIC med list below.  She reports no difficulty with compliance or side-effects  She denies chest pain, dyspnea, headache, lower extremity edema or localized weakness or paresthesias  Her last three blood pressures in clinic were:  Most Recent BP Reading(s)  01/15/13 : 124/72  01/03/13 : 140/82  05/12/12 : 128/85  Her Most Recent Weight Reading(s)  01/15/13 : 203 lb (92.08 kg)  01/03/13 : 204 lb (92.534 kg)  05/12/12 : 200 lb (90.719 kg)  11/13/11 : 198 lb (89.812 kg)    last potassium was POTASSIUM (mmol/L)   Date  Value    10/29/2011  3.7    ----------  last creatinine was CREATININE (mg/dl)   Date  Value    1/61/0960  0.9    ----------    Review of symptoms:     No fevers or unexplained weight loss. No visual changes. No sore throat or ear ache.  No prolonged cough. No dyspnea or chest pain on exertion.  No abdominal pain or change in bowel  habits.  No vaginal discharge or dysuria.  No new rashes or skin changes.  No pain or masses in breasts. No paresthesias or unusual headaches. No sadness or anxiety that interferes with day-to-day activities. No hot flushes. No enlarged nodes.  No new itching, sneezing or wheezing.    BP 124/72   Pulse 61   Temp(Src) 98.3 F (36.8 C) (Oral)   Wt 203 lb (92.08 kg)   BMI 35.97 kg/m2   LMP 12/19/2012  General:Patient appears well, alert and oriented x 3, pleasant, cooperative.   Eyes: anicteric conjunctiva, PERLA bilaterally.   Ears: tm's wnl bilaterally without erythema or exudate.   Oropharynx: no erythema or exudate.   Neck supple and free of adenopathy, or masses. No thyromegaly.  Breasts are symmetric.  No dominant, discrete, fixed  or suspicious masses are noted.  symmetric fibrocystic densities are noted bilaterally. No skin or nipple changes or axillary nodes.   Lymph: no enlargement of anterior cervical, posterior cervical or supraclavicular lymph nodes   Cardiovascular: regular rate & rhythm, no murmurs, gallops or rubs appreciated.   Chest: clear to auscultation bilaterally, no crackles, rhonchi or wheezes.   Abdomen is obese soft, no tenderness, masses or organomegaly.  Extremities: no clubbing, cyanosis or edema.    Skin is normal without suspicious lesions noted.  Mental status exam: Normal thought content, speech, affect, mood and dress are noted.  Musculoskeletal exam: within normal limits without joint swelling or deformities  Screening neurological exam is normal without focal findings.     Knee exam: full range of motion, no pain on motion, no effusion, tenderness, masses, ligamentous instability or deformity noted.   The lower extremities are normal and reveal no sign of DVT. Calves and thighs are soft and non tender, color is normal, no swelling or redness. Homan's sign is negative.  Pedal pulses are normal.   Cervical, thoracic and lumbar spine exam is normal without tenderness, masses or  kyphoscoliosis. Full range of motion without pain is noted.     ASSESSMENT & PLAN:  (401.1) Essential hypertension, benign  (primary encounter diagnosis)  Comment: well controlled continue current regimen   Plan: BUN (UREA NITROGEN), CREATININE, POTASSIUM,         COLLECTION VENOUS BLOOD VENIPUNCTURE           (305.1) Tobacco dependence  Comment: she is early on her quit attempt -- provided extensive supportive counseling       (278.00) Obesity  Comment: this may be contributing her knee & back pains -- she is also at risk of developing DM given her pre-DM -- strongly recommended modest weight loss, increase in exercise & seeing RD  Plan: REFERRAL TO NUTRITION ( INT) NON-DIABETES            (790.29) Pre-diabetes  Comment: as above -- focus on modest weight loss   Plan: REFERRAL TO NUTRITION ( INT) NON-DIABETES            (V70.0) Routine general medical examination at a health care facility  Comment: We discussed the pros and cons of screening mammo -- we discussed that there are real benefits in terms of decrease risk of dying from breast cancer but there's a cost which is many procedures & treatments, many of which might not be necessary. So, if she wishes to do everything to decrease her risk of dying from breast cancer -- then I would recommend a mammogram. But she should not feel guilty if she decided not to pursue mammograms at this time.  We also discussed the option of performing mammograms q21yr or q4yr and that she can change her mind at any time in the future.  After detailed discussion & answering all her questions, she decided to wait till 47 y/o to start mammo screening.   Plan:     (729.5) Right leg pain  Comment: her leg pain does not fit a classic pattern -- considered sciatica, musculoskeletal pains, varicose veins - -because of the location I think that musculoskeletal possibly exacerbated by her weight & her work is most likely -- will restart ibuprofen & refer  Plan: REFERRAL TO PHYSICAL THERAPY (  INT)        We discussed the use of the NSAID prescribed in detail.  I explained that it should be taken on a full stomach and that it is likely to cause some stomach upset.  I reviewed the importance of stopping the NSAID if she were to notice any new rashes or any change in the color of her stool (either black or red) -- she should call the clinic immediately. I gave her instructions with all these details as well as details on how to take the medicine in my hand-out entitled  [facility].     (724.2) Lower back pain  Comment: her lower back pain also appears to be musculoskeletal possibly exacerbated by awkward movements because of her right knee/leg pain -- will  Plan: REFERRAL TO PHYSICAL THERAPY ( INT)            (719.46) Knee pain  Comment: as above  Plan: ibuprofen (ADVIL,MOTRIN) 600 MG tablet          (V04.81) Need for prophylactic vaccination and inoculation against influenza    Plan: IMMUNIZATION ADMIN SINGLE, RN, PR INFLUENZA         VACCINE QUADRIVALENT 3 YRS PLUS IM              We discussed the patients current medications. The patient expressed understanding and no barriers to adherence were identified.    she has been advised to call or return with any worsening or new problems

## 2013-01-15 NOTE — Progress Notes (Signed)
Influenza Vaccine Procedure  January 15, 2013    1. Has the patient received the information for the influenza vaccine? Yes    2. Does the patient have any of the following contraindications?  Allergy to eggs? No  Allergic reaction to previous influenza vaccines? No  Any other problems to previous influenza vaccines? No  Paralyzed by Guillain-Barre syndrome?  No  Current moderate or severe illness? No  Allergy to contact lens solution? No    3. The vaccine has been administered in the usual fashion.     Immunization information reviewed. Current VIS reviewed and given to patient/ guardian. Verbal assent obtained from patient/ guardian.  See immunization/Injection module or chart review for date of publication and additional information. Verbal assent obtained from patient/guardian. Comfort measures for possible side effects reviewed.

## 2013-01-16 ENCOUNTER — Telehealth (HOSPITAL_BASED_OUTPATIENT_CLINIC_OR_DEPARTMENT_OTHER): Payer: Self-pay | Admitting: Internal Medicine

## 2013-01-16 DIAGNOSIS — I10 Essential (primary) hypertension: Principal | ICD-10-CM

## 2013-01-16 NOTE — Progress Notes (Signed)
Hi Amanda Cervantes,     Please call pt to let her know that I received her lab tests.    K+ is a bit low  Let's see how she does with changing her chlorthalidone from 1 tab / day to 1/2 tab/day  After she's been taking that for 2-3 weeks -- she should return for BP check & at that time we'll repeat k/cr (orders placed)    Thanks, [employer]

## 2013-01-16 NOTE — Progress Notes (Signed)
Left message with [employer]#[phone], asking pt to call Dr.Cohen's nurse

## 2013-01-27 NOTE — Progress Notes (Signed)
Can we try to give her another call? Thanks, Caili Escalera

## 2013-01-29 NOTE — Progress Notes (Signed)
Spoke with pt. Will start chlorthalidone 25mg  at 1/2 tab daily starting tomorrow. States checked her BP at home was 120/60. Given appt 3 wks Dec 5 9AM for BP check and labs. Reminded please take meds at usual time and no need to be fasting.

## 2013-02-10 ENCOUNTER — Ambulatory Visit (HOSPITAL_BASED_OUTPATIENT_CLINIC_OR_DEPARTMENT_OTHER): Payer: PRIVATE HEALTH INSURANCE | Admitting: Registered"

## 2013-02-10 VITALS — BP 126/78 | Temp 98.6°F | Wt 202.8 lb

## 2013-02-10 DIAGNOSIS — E669 Obesity, unspecified: Secondary | ICD-10-CM

## 2013-02-10 DIAGNOSIS — R7303 Prediabetes: Principal | ICD-10-CM

## 2013-02-10 NOTE — Progress Notes (Signed)
INITIAL NUTRITION ASSESSMENT / INTERVENTION    Beginning Time: 11 AM  End Time: 11:30 AM     Total Minutes: 30 minutes    Amanda Cervantes is a 47 year old female who presents with preDM.  Visit via [place] interpreter, via phone.  The patient is from [place]. Ethnicity: [place].  Patient's language is [place] and [place]. Patient able to read [place] and [place].  She has been in the [place] for > 5 years.  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  Patient lives with boyfriend and daughter.     Subjective (including lifestyle changes): Patient reports she was told by her doctor to see a dietitian b/c she has preDM and low potassium.     Patient has been trying to diet / lose weight   Choose fat free milk   Using artificial sweeteners   Eating more vegetables - vegetable soup at lunch   Choose light yogurt    Eating fruits    Patient tends to follow the diet 5d/week but goes off on weekend        Family History    Heart Disease Father     Comment: died age 56 from MI; learned about HTN in 48's    Cancer - Other Mother     Comment: stomach cancer, died    GI Sister     Comment: hernia       Patient has had preDM for 1-5 years and has had Medical Nutrition Therapy before  1-5 years at [facility].      Current Outpatient Prescriptions:  chlorthalidone (HYGROTEN) 25 MG tablet Take 1 tablet by mouth daily. Disp: 30 tablet Rfl: 5     No current facility-administered medications for this visit.    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    Estimated body mass index is 35.93 kg/(m^2) as calculated from the following:    Height as of 07/25/09: 5' 2.99" (1.6 m).    Weight as of this encounter: 202 lb 12.8 oz (91.989 kg).  BMI Category: 35-39.9 obesity class II    Highest weight: not reviewed       Lowest weight: not reviewed       Desired weight: < present  Physical Activity:  Type: used to go to the gym but stopped b/c of pain in her legs    Barriers to physical activity include: pain     Activities of Daily Living: Independent    Chewing ability: no concerns   Swallowing ability: no concerns   Appetite: good    Food Allergies: No known allergies  Food Intolerances: none       Religious restrictions/Special diet: None  Food purchased by: self       Food prepared by: self  Meal sources: home cooked and take out / restaurant, 1-2 times per month ([place] or [place] foods)  Economic factors and food assist: declined  Psychosocial factors / Mental Status: interactive with questions  Stress: Fair  Readiness to learn: eager    Dietary history / usual intake reveals patient follows reduced calories diet.  She eats 3 meals and 3 snacks per day @ 1100 kcal    Wake at [employer] (6am) - milk + coffee + splenda; 4-5 saltines (150 kcal)    Snack (9am) - 1 fruit or 1 low fat yogurt (~100 kcal)    Meal (12-1) - vegetable soup (broccoli, cauliflower, carrots, chayote, chicken - no potato, corn, peas) (~300 kcal)    Snack (3pm) - 1 fruit or 1 low fat yogurt (~100 kcal)    Meal (6pm) - vegetable soup (broccoli, cauliflower, carrots, chayote, chicken - no potato, corn, peas)  (~300 kcal)    Snack (7pm) - 8-12 oz fat free milk  (~100 kcal)   Sleep at 10pm  Modifiers :    Eating 1100 kcal during the week   Started eating soup ~30 days ago    Sometimes have 2 c rice, 1/2 c beans, 1 egg, and 8 oz beef and will skip the diet (1300 kcal)    Trying to avoid sweets but last week had cakes b/c daughter brought home    Portion size is appropriate.  Eating frequency is appropriate  Eating habits based upon efforts to lose weight    Estimated intake shows: sub-optimal nutrients: none  excessive nutrients: calories, carbohydrates and sodium - on weekends    Assessment/Plan/Conclusion:  Patient presents with obesity and preDM related to inconsistent application of weight loss diet as evidenced by 1100 kcal intake during weekdays but >2000 kcal on weekends.     Reviewed the following nutrition education:   Impact of weight loss on sugar levels   Reviewed importance of consistency in weight loss - reviewed calories in current recall and choices   Reviewed methods to track calories   Discussed methods to stay on track during weekend    Goals:   Based upon the education and discussion, patient and provider arrive at the following goals:   Stay on track through weekend - focus on vegetables and smaller portions of "treats" if eat out    Barriers to dietary changes include: cultural    Material provided:client goals    Future educational needs: monitor for wt changes, a1c changes    Referred to: None    Follow-up: 6 weeks.    Jerene Pitch, [place],RD,LDN

## 2013-02-20 ENCOUNTER — Other Ambulatory Visit (HOSPITAL_BASED_OUTPATIENT_CLINIC_OR_DEPARTMENT_OTHER): Payer: Self-pay | Admitting: Ambulatory Care

## 2013-02-20 ENCOUNTER — Ambulatory Visit (HOSPITAL_BASED_OUTPATIENT_CLINIC_OR_DEPARTMENT_OTHER): Payer: PRIVATE HEALTH INSURANCE | Admitting: Ambulatory Care

## 2013-02-20 VITALS — BP 110/62 | HR 51

## 2013-02-20 DIAGNOSIS — I10 Essential (primary) hypertension: Principal | ICD-10-CM

## 2013-02-20 LAB — CREATININE: CREATININE: 0.6 mg/dL (ref 0.4–1.2)

## 2013-02-20 LAB — POTASSIUM: POTASSIUM: 3.5 mmol/L (ref 3.5–5.1)

## 2013-02-20 LAB — BUN (UREA NITROGEN): BUN (UREA NITROGEN): 19 mg/dL — ABNORMAL HIGH (ref 7–18)

## 2013-02-20 MED ORDER — IBUPROFEN 600 MG PO TABS
600.0000 mg | ORAL_TABLET | Freq: Three times a day (TID) | ORAL | Status: AC | PRN
Start: 2013-02-20 — End: 2013-02-25

## 2013-02-20 NOTE — Progress Notes (Signed)
Here for BP check, asking for Rx til she gets some relief at P.T.

## 2013-02-20 NOTE — Progress Notes (Signed)
Seen with tel interpreters Jonny Ruiz and [place].  Been taking chlorthalidone 12.5mg  daily. BP checked with lg cuff today 110/62, apical pulse 1 min 51. Denies SOB, dizziness, chest pain. States feels good other than bilateral leg discomfort. Asked re physical therapy- states "it's my fault", husband told her to take better care of herself. She will call today and arrange. States needs ibuprofen til she improves with p.t. Will forward to provider to order if okay. She always takes with food and watches BM is normal.  Discussed with pcp- she will stop the chlorthalidone and RTC 4 wks for BP check. Labs done today. States will call if notices BP is high at home as she does check.

## 2013-03-24 ENCOUNTER — Telehealth (HOSPITAL_BASED_OUTPATIENT_CLINIC_OR_DEPARTMENT_OTHER): Payer: Self-pay

## 2013-03-24 ENCOUNTER — Ambulatory Visit (HOSPITAL_BASED_OUTPATIENT_CLINIC_OR_DEPARTMENT_OTHER): Payer: PRIVATE HEALTH INSURANCE | Admitting: Registered"

## 2013-03-24 NOTE — Progress Notes (Signed)
Call to patient to reschedule appt into my schedule and patient reports she was planning to call and cancel appt for tomorrow due to work  Offered several other options this week-declined  Will call us back once speaking with boss  Will send to [place]  Please outreach patient at end of week to offer reschedule  fyi to provider

## 2013-03-25 ENCOUNTER — Ambulatory Visit (HOSPITAL_BASED_OUTPATIENT_CLINIC_OR_DEPARTMENT_OTHER): Payer: PRIVATE HEALTH INSURANCE | Admitting: Registered Nurse

## 2013-04-20 ENCOUNTER — Ambulatory Visit (HOSPITAL_BASED_OUTPATIENT_CLINIC_OR_DEPARTMENT_OTHER): Payer: PRIVATE HEALTH INSURANCE

## 2013-04-20 VITALS — BP 154/96 | HR 70 | Temp 98.4°F

## 2013-04-20 DIAGNOSIS — I10 Essential (primary) hypertension: Principal | ICD-10-CM

## 2013-04-20 MED ORDER — LISINOPRIL 2.5 MG PO TABS
2.5000 mg | ORAL_TABLET | Freq: Every day | ORAL | Status: DC
Start: 2013-04-20 — End: 2014-03-01

## 2013-04-20 NOTE — Progress Notes (Addendum)
Patient presented to clinic for Amanda Cervantes. She had been seen in clinic for a [facility] nurse visit on 12/05 and was stopped on Chlorthalidone at that time (d/t low K) so is taking no medications presently.    She last checked her BP last week at home, and it was 120 - 130 / 90s. Checks BP at home as frequently as weekly.    She states that her BP always goes up when she comes to the clinic and that it usually comes down a few minutes after sitting in the patient room. We checked B/L arms initially and check her R arm BP again after ten minutes, and the BP did decrease from 162 / 90 --> 154 / 96. Pulse went down from 91 --> 70, as well. O2 sat high 90s%, afebrile.    Denies CP, SOB, dizziness, visual changes here or at home.     Reviewed case with Dr. Patrice Paradise. Will start patient on Lisinopril 2.5 mg po daily and have her RTC in three weeks for [facility] nurse visit. She would like this med sent to the [facility] on [address] in [place]. Counseled her on SE profile (cough, swelling) and to call the clinic is she presents with any.    Also advised the patient to check her BP daily at home at the same time each day and record the values so that she may bring the log to her next nurse visit on 23 Feb (Mon). Next nurse visit will entail both BPC and labs (K, Cr, BUN).    Conducted the visit with aid of telephonic [place] interpreter.

## 2013-04-22 NOTE — Addendum Note (Signed)
Addended by: Latanya Presser on: 04/22/2013 09:50 AM     Modules accepted: Orders

## 2013-05-11 ENCOUNTER — Ambulatory Visit (HOSPITAL_BASED_OUTPATIENT_CLINIC_OR_DEPARTMENT_OTHER): Payer: PRIVATE HEALTH INSURANCE

## 2013-05-11 VITALS — BP 140/90

## 2013-05-11 DIAGNOSIS — I10 Essential (primary) hypertension: Principal | ICD-10-CM

## 2013-05-11 LAB — POTASSIUM: POTASSIUM: 4 mmol/L (ref 3.5–5.1)

## 2013-05-11 LAB — BUN (UREA NITROGEN): BUN (UREA NITROGEN): 12 mg/dL (ref 7–18)

## 2013-05-11 LAB — CREATININE: CREATININE: 0.5 mg/dL (ref 0.4–1.2)

## 2013-05-11 NOTE — Progress Notes (Signed)
Here for BP recheck after starting lisinopril 2.5 mg  Has been taking lisinopril as directed since last visit every AM-took this am one hour prior to appt  Feels good without complaints  bp today 140/90-discussed with provider-no changes-continue as directed  Labs done today

## 2013-07-31 ENCOUNTER — Ambulatory Visit (HOSPITAL_BASED_OUTPATIENT_CLINIC_OR_DEPARTMENT_OTHER): Payer: PRIVATE HEALTH INSURANCE | Admitting: Rehabilitative and Restorative Service Providers"

## 2013-07-31 DIAGNOSIS — M25562 Pain in left knee: Principal | ICD-10-CM

## 2013-07-31 NOTE — Progress Notes (Signed)
Outpatient Physical Therapy Initial Evaluation  [facility]: [facility]    Amanda Cervantes is a 48 year old female who presents to [place] with complaints of L knee, insidious onset 7 months ago. Pain has been progressing since onset.    Patient reports symptoms increase with bending, squatting and walking and decrease with rest and meds. Pain is located Left anterior knee and radiates into L ankle and foot and is described as  burning.     Patient learns best by verbal and demonstration.    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 Disp: 90 tablet Rfl: 6     No current facility-administered medications on file prior to visit.   07/31/13 0904   Language Information   Language of Care Portuguese   Interpreter Yes   Evaluation Type   Evaluation Type Initial Evaluation   Rehab Discipline   Rehab Discipline PT   Visit   Visit number 1   POC Due date 08/31/13   Pain   Pain Score 10    Patient Stated Goals   Patient stated goals Decrease pain and return to functional activities   Posture   Left Genu Valgum Moderate   Right Genu Valgum Mild   Calcaneal Position  right inverted L slighted everted   AROM LLE (degrees)   Overall AROM WFL   Strength LLE   L Knee Flexion 5/5   L Knee Extension 4/5   AROM RLE (degrees)   Overall AROM WNL   Strength RLE   RLE Overall Strength WFL   Left Knee results   L Anterior Drawer  Negative   L Lachman's  Negative   L Posterior Drawer  Negative   L Valgus Stress   Negative   L Varus Stress Negative   L Rotatory Stability Negative   L Patellar Grind Positive   Functional Mobility   Gait Pt ambulating with slight antalgic gait   Balance Pt has good staitic balance   Functional Activities/Ergonomics   Squatting Impaired   Step-Down Impaired   Recreation walking and biycle riding   Occupation house cleaning   Palpation   Tenderness to Palpation minimal around patella   Left Knee Patellofemoral   L Patellofemoral Superior 2/6   L Patellofemoral Inferior 2/6   L Patellofemoral Medial 2/6   L Patellofemoral Lateral 2/6   Patient Education   What was taught? HEP   Method Verbal;Demo;Written   Patient comprehension Yes  (interpreter)                  PT  Physical Therapy Plan of Care    WN:UUVOZD Cohen, MD  Referring Provider: Gavin Pound  [address]  [place], [place] [address]  Diagnosis: Left knee pain  (primary encounter diagnosis)    Assessment/Objective  Findings:   Patient is a 48 year old female with complains of pain in her left Knee .     Pt reports onset of pain in October 2014  due to nothing at all but was afraid to come to PT due to stories from friends that PT would increase pain.  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 Pt expresses long term goal of no pain nad return to PLOF, is motivated to work towards this in PT.    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     These problems limit the patient with the following functional activities: squatting, walking and stairs. The prescribed treatment plan of care is medically necessary.      Short Term Functional Goals: 4 weeks.   Pt  will increase L knee ROM by 10 with min pain  Pt will decrease pain from 10/10 to 5/10  Pt will increase L knee Strength by 1/2 grade  I HEP  Long Term Goal: 6 weeks.   Pt will return to PLOF with min to no limitations or pain  Pt will demonstrate L knee full ROM and good strength.  Pt will ascend and descend stairs with normal pattern and no pain          Treatment Plan:  ** Stretching/ROM Exercise  ** Therapeutic Exercise  ** Home Exercise Program  ** Joint Mobilization  ** Soft Tissue Mobilization  ** Electrical Stimulation/TENS  ** Hot/Cold Rx  ** Gait Training  ** Functional Activities  ** Patient Education  KT taping    Recommend Physical Therapy be continued 2 times per week for 4 weeks.  The rehabilitation potential for this patient is good    Patient Regis Bill is aware of attendance policy: Yes  Plan of care discussed with Patient/Family: Yes  Patient goals reviewed and incorporated in plan of care: Yes  Patient/Family agrees with plan of care: Yes  Patient/Family education: Yes  Does patient feel safe at home: Yes      [name]

## 2013-07-31 NOTE — Progress Notes (Signed)
I certify that the documented Treatment Plan is reasonable and necessary.    07/31/2013  Gavin Pound, MD

## 2013-08-03 ENCOUNTER — Ambulatory Visit (HOSPITAL_BASED_OUTPATIENT_CLINIC_OR_DEPARTMENT_OTHER): Payer: PRIVATE HEALTH INSURANCE | Admitting: Rehabilitative and Restorative Service Providers"

## 2013-08-03 DIAGNOSIS — M25562 Pain in left knee: Principal | ICD-10-CM

## 2013-08-03 NOTE — Progress Notes (Signed)
08/03/13 [place] No   Rehab Discipline   Rehab Discipline PT   Visit   Visit number 2   POC Due date 08/31/13   Time Calculation   Start Time 0730   Pain   Pain Score 5    Manual Therapy   Manual Therapy Yes   Technique STM   Time in minutes 8   Manual Therapy 2   Technique patella mobility all directions L   Time in minutes 2   Ther Exercise   Exercise QS   Reps 10   Sets 1   Ther Exercise 2   Exercise SAQ   Reps 2 10   Sets 2 1   Ther Exercise 3   Exercise 3 HS   Reps 3 10   Sets 3 1   Ther Exercise 4   Exercise 4 SLR   Reps 4 10   Sets 4 1   Modalities   Type of modalities Cold pack;Electrical stimulation   Electrical Stimulation   Joints Left;Knee   Position Supine   Time in minutes 15   Parameters quad-pola   Cold pack   Joints Left;Knee   Position Supine   Time in minutes 15   Patient Education   What was taught? HEP   Method Verbal;Demo;Written   Patient comprehension Yes

## 2013-08-03 NOTE — Progress Notes (Signed)
S: 5/10 L knee pain but had many visitors over weekend and pain was 11/10  O: Refer to Rehabilitation Treatment Flowsheet  A: Pt tolerated first treatment well.  Required verbal and tactile cues for theres. Pt forgot written HEP last visit so pt was handed it today.  P: Continue per plan of care.

## 2013-08-06 ENCOUNTER — Ambulatory Visit (HOSPITAL_BASED_OUTPATIENT_CLINIC_OR_DEPARTMENT_OTHER): Payer: PRIVATE HEALTH INSURANCE | Admitting: Rehabilitative and Restorative Service Providers"

## 2013-08-06 DIAGNOSIS — M79604 Pain in right leg: Principal | ICD-10-CM

## 2013-08-06 NOTE — Progress Notes (Signed)
S: Pain 5-6. Patient reports pain is minimal in morning, but it increases to 11/10 every night. Patient have been doing HEP for 2 days, and she has pain around medial and inferior sides of knee, as well as peroneals after HEP.  O: Refer to Rehabilitation Treatment Flowsheet  A: STM reveals stiffness on medial head of hamstrings, and medium swelling around knee joint. Crepitus on B knees when knee flex/ext. Patient did not tolerate therex fair today due to pain and swelling around L knee.    P: Continue stretching therex and resume knee strengthening therex as tolerated.

## 2013-08-06 NOTE — Progress Notes (Signed)
08/06/13 0900   Language Information   Language of Care Portuguese   Interpreter No   Rehab Discipline   Rehab Discipline PT   Visit   Visit number 3   POC Due date 08/31/13   Time Calculation   Start Time 0930   Stop Time 1007   Time Calculation (min) 37 min   Pain   Pain Score 6    Manual Therapy   Manual Therapy Yes   Technique STM HS, ADDuctor   Time in minutes 8   Manual Therapy 2   Technique patella mobility all directions L   Time in minutes 2   Ther Exercise   Exercise QS   Reps 10   Sets 1   Ther Exercise 3   Exercise 3 HS stretch   Reps 3 10   Sets 3 1   Ther Exercise 4   Exercise 4 ADDuctor stretch   Reps 4 10   Sets 4 1   Modalities   Type of modalities Cold pack;Electrical stimulation   Electrical Stimulation   Joints Left;Knee   Position Supine   Time in minutes 15   Parameters quad-pola, post-rx with CP   Cold pack   Joints Left;Knee   Position Supine   Time in minutes 15   Parameters post-rx with Estim   Patient Education   What was taught? HEP   Method Verbal;Demo;Written   Patient comprehension Yes

## 2013-08-12 ENCOUNTER — Ambulatory Visit (HOSPITAL_BASED_OUTPATIENT_CLINIC_OR_DEPARTMENT_OTHER): Payer: PRIVATE HEALTH INSURANCE | Admitting: Rehabilitative and Restorative Service Providers"

## 2013-08-12 DIAGNOSIS — M79609 Pain in unspecified limb: Secondary | ICD-10-CM

## 2013-08-12 DIAGNOSIS — M25562 Pain in left knee: Principal | ICD-10-CM

## 2013-08-12 NOTE — Progress Notes (Signed)
S: Pain 6/10, compliant with HEP, still have pain in medial side of L knee after HEP. 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 Patient reports she has less pain in both knees during this PT therex session than previous sessions.  O: Refer to Rehabilitation Treatment Flowsheet  A: Tender to palpation and moderate swelling on L knee pes anserine. Patient tolerated therex well. Gave patient Hamstring stretch over stairs as HEP today.    P: Continue strengthening L knee area as per plan of care. Continue 3 more ultrasound sessions and follow up with patient about its effects and benefits.

## 2013-08-17 ENCOUNTER — Ambulatory Visit (HOSPITAL_BASED_OUTPATIENT_CLINIC_OR_DEPARTMENT_OTHER): Payer: PRIVATE HEALTH INSURANCE | Admitting: Rehabilitative and Restorative Service Providers"

## 2013-08-17 DIAGNOSIS — M25562 Pain in left knee: Principal | ICD-10-CM

## 2013-08-17 NOTE — Progress Notes (Signed)
S: Pt states she is feeling much better  O: Refer to Rehabilitation Treatment Flowsheet  A: Pt progressing well  P: Continue per plan of care.

## 2013-08-17 NOTE — Progress Notes (Signed)
08/17/13 1007   Language Information   Language of Care Portuguese   Interpreter No   Rehab Discipline   Rehab Discipline PT   Visit   Visit number 4   POC Due date 08/31/13   Time Calculation   Start Time 0730   Stop Time 0800   Time Calculation (min) 30 min   Pain   Pain Score 3    Manual Therapy   Manual Therapy Yes   Technique STM HS, ADDuctor   Time in minutes 8   Manual Therapy 2   Technique patella mobility all directions L   Time in minutes 2   Ther Exercise   Exercise QS, bilateral   Reps 10   Sets 2   Ther Exercise 2   Exercise SAQ ,bilateral   Reps 2 10   Sets 2 2   Ther Exercise 3   Exercise 3 SLR with leg ER, bilateral   Reps 3 20   Sets 3 1   Ther Exercise 4   Exercise 4 S/L hip ABD, bilateral   Reps 4 20   Sets 4 1   Ther Exercise 5   Exercise 5 prone hip Ext, bilateral   Reps 5 10   Sets 5 2   Modalities   Type of modalities Cold pack   Electrical Stimulation   Joints Left;Knee   Position Supine   Time in minutes 15   Cold pack   Joints Left;Knee   Position Supine   Time in minutes 15   Patient Education   What was taught? HEP   Method Verbal;Demo;Written   Patient comprehension Yes

## 2013-08-19 ENCOUNTER — Ambulatory Visit (HOSPITAL_BASED_OUTPATIENT_CLINIC_OR_DEPARTMENT_OTHER): Payer: PRIVATE HEALTH INSURANCE | Admitting: Rehabilitative and Restorative Service Providers"

## 2013-08-19 DIAGNOSIS — M25562 Pain in left knee: Principal | ICD-10-CM

## 2013-08-19 NOTE — Progress Notes (Signed)
08/19/13 1947   Language Information   Language of Care English   Interpreter No   Rehab Discipline   Rehab Discipline PT   Visit   Visit number 6   POC Due date 08/31/13   Time Calculation   Start Time 0730   Stop Time 0800   Time Calculation (min) 30 min   Pain   Pain Score 3    Manual Therapy 2   Technique patella mobility all directions L   Time in minutes 2   Ther Exercise   Exercise QS, bilateral   Reps 10   Sets 2   Ther Exercise 2   Exercise SAQ ,bilateral   Reps 2 10   Sets 2 2   Ther Exercise 3   Exercise 3 SLR with leg ER, bilateral   Reps 3 20   Ther Exercise 4   Exercise 4 S/L hip ABD, bilateral   Reps 4 20   Sets 4 1   Ther Exercise 5   Exercise 5 SLR   Reps 5 10   Sets 5 2   Modalities   Type of modalities Hot pack   Hot pack   Joints Left;Knee   Electrical Stimulation   Joints Left;Knee   Position Supine   Time in minutes 15   Parameters pre-rx with CP, pre-mod   Cold pack   Joints Knee;Right   Position Supine   Time in minutes 15   Parameters pre-rx with Estim   Other   Other Treatments Miscellaneous   Miscellaneous Taping   Patient Education   What was taught? HEP   Method Verbal;Demo;Written   Patient comprehension Yes

## 2013-08-19 NOTE — Progress Notes (Signed)
S: 3/10  O: Refer to Rehabilitation Treatment Flowsheet  A: Pt progressing nicely.  Continue to progress HEP  P: Continue per plan of care.

## 2013-08-24 ENCOUNTER — Ambulatory Visit (HOSPITAL_BASED_OUTPATIENT_CLINIC_OR_DEPARTMENT_OTHER): Payer: PRIVATE HEALTH INSURANCE | Admitting: Rehabilitative and Restorative Service Providers"

## 2013-08-24 DIAGNOSIS — M25562 Pain in left knee: Principal | ICD-10-CM

## 2013-08-24 NOTE — Progress Notes (Signed)
08/24/13 1054   Language Information   Language of Care English   Interpreter No   Rehab Discipline   Rehab Discipline PT   Visit   Visit number 7   POC Due date 08/31/13   Time Calculation   Start Time 0730   Stop Time 0800   Time Calculation (min) 30 min   Pain   Pain Score 0    Manual Therapy 2   Technique patella mobility all directions L   Time in minutes 2   Ther Exercise   Exercise QS, bilateral   Reps 10   Sets 2   Ther Exercise 2   Exercise SAQ ,bilateral   Reps 2 10   Sets 2 2   Ther Exercise 3   Exercise 3 SLR with leg ER, bilateral   Reps 3 20   Sets 3 1   Ther Exercise 4   Exercise 4 S/L hip ABD, bilateral   Reps 4 20   Sets 4 1   Ther Exercise 5   Exercise 5 SLR   Reps 5 10   Sets 5 2   Ther Exercise 6   Exercise 6 Hip ABD   Reps 6 10   Sets 6 2   Modalities   Type of modalities Cold pack   Hot pack   Joints Left;Knee   Electrical Stimulation   Joints Left;Knee   Position Supine   Time in minutes 15   Cold pack   Joints Knee;Left   Position Supine   Time in minutes 15   Other   Other Treatments Miscellaneous   Miscellaneous Taping   Patient Education   What was taught? HEP   Method Verbal;Demo;Written

## 2013-08-24 NOTE — Progress Notes (Signed)
S: 0/10  O: Refer to Rehabilitation Treatment Flowsheet  A: Pt is progressing well.  Some pain after washing floors on her hands and knee's Saturday but pain has gone away  discussed start of discharge planning  P: Continue per plan of care.

## 2013-08-26 ENCOUNTER — Ambulatory Visit (HOSPITAL_BASED_OUTPATIENT_CLINIC_OR_DEPARTMENT_OTHER): Payer: PRIVATE HEALTH INSURANCE | Admitting: Rehabilitative and Restorative Service Providers"

## 2013-08-26 DIAGNOSIS — M25562 Pain in left knee: Principal | ICD-10-CM

## 2013-08-26 NOTE — Progress Notes (Signed)
08/26/13 1307   Language Information   Language of Care English   Interpreter No   Rehab Discipline   Rehab Discipline PT   Visit   Visit number 8   POC Due date 08/31/13   Time Calculation   Start Time 0800   Stop Time 0830   Time Calculation (min) 30 min   Pain   Pain Score 0    Manual Therapy 2   Technique patella mobility all directions L   Time in minutes 2   Ther Exercise   Exercise QS, bilateral   Reps 10   Sets 2   Ther Exercise 2   Exercise SAQ ,bilateral   Reps 2 20   Sets 2 1   Ther Exercise 3   Exercise 3 SLR with leg ER, bilateral   Reps 3 20   Sets 3 1   Ther Exercise 4   Exercise 4 S/L hip ABD, bilateral   Reps 4 20   Sets 4 1   Ther Exercise 5   Exercise 5 SLR   Reps 5 10   Sets 5 2   Ther Exercise 6   Exercise 6 Step ups with UE support 10 B   Reps 6 1   Modalities   Type of modalities Cold pack   Hot pack   Joints Left;Knee   Electrical Stimulation   Joints Left;Knee   Position Supine   Time in minutes 15   Parameters pre-rx with CP, pre-mod   Cold pack   Joints Knee;Left   Position Supine   Time in minutes 15   Other   Other Treatments Miscellaneous   Miscellaneous Taping   Patient Education   What was taught? HEP   Method Verbal;Demo;Written   Patient comprehension Yes

## 2013-08-26 NOTE — Progress Notes (Signed)
S: Discussed therex and discharge planning  O: Refer to Rehabilitation Treatment Flowsheet  A: Added standing CKE's for quad strength to HEP  P: D/C next visit

## 2013-08-31 ENCOUNTER — Ambulatory Visit (HOSPITAL_BASED_OUTPATIENT_CLINIC_OR_DEPARTMENT_OTHER): Payer: PRIVATE HEALTH INSURANCE | Admitting: Rehabilitative and Restorative Service Providers"

## 2013-08-31 DIAGNOSIS — M25562 Pain in left knee: Principal | ICD-10-CM

## 2013-08-31 NOTE — Progress Notes (Signed)
08/31/13 0800   Language Information   Language of Care English   Interpreter No   Rehab Discipline   Rehab Discipline PT   Visit   Visit number 9   POC Due date 08/31/13   Time Calculation   Start Time 0730   Stop Time 0800   Time Calculation (min) 30 min   Pain   Pain Score 2    Manual Therapy 2   Technique patella mobility all directions L   Time in minutes 2   Ther Exercise   Exercise QS, bilateral   Reps 10   Sets 2   Ther Exercise 2   Exercise SAQ ,bilateral   Reps 2 20   Sets 2 1   Ther Exercise 3   Exercise 3 SLR with leg ER, bilateral   Reps 3 20   Sets 3 1   Ther Exercise 4   Exercise 4 S/L hip ABD, bilateral   Reps 4 20   Sets 4 1   Ther Exercise 5   Exercise 5 SLR   Reps 5 10   Sets 5 2   Ther Exercise 6   Exercise 6 Step ups with UE support 10 B   Reps 6 1   Hot pack   Joints Left;Knee   Electrical Stimulation   Joints Left;Knee   Position Supine   Time in minutes 15   Parameters pre-rx with CP, pre-mod   Cold pack   Joints Knee;Left   Position Supine   Time in minutes 15   Other   Other Treatments Miscellaneous   Miscellaneous Taping   Patient Education   What was taught? reviewed HEP, DC instructions   Method Verbal;Demo   Patient comprehension Yes

## 2013-08-31 NOTE — Progress Notes (Signed)
.  PHYSICAL THERAPY  DISCHARGE REPORT    DIAGNOSIS: L knee  MD: Gavin Pound  [place] [address]  [place], [place] [address]    Your patient, Amanda Cervantes, has been discharged from Physical Therapy after a total of 10 visits.    REASON(S) FOR DISCHARGE:    This patient has: reached max potential level at this time.  Pt still has occasional pain when pt ambulates for prolonged period of time or increases activity    RECOMMENDATIONS:    This patient should: continue strengthening exercies    COMMENTS: Return to MD if condition worsens    If you have any questions please feel free to contact the department at Dept: [phone].    Therapist: Lanell Matar MSPT  08/31/13

## 2013-09-02 ENCOUNTER — Ambulatory Visit (HOSPITAL_BASED_OUTPATIENT_CLINIC_OR_DEPARTMENT_OTHER): Payer: PRIVATE HEALTH INSURANCE | Admitting: Rehabilitative and Restorative Service Providers"

## 2013-09-07 ENCOUNTER — Ambulatory Visit (HOSPITAL_BASED_OUTPATIENT_CLINIC_OR_DEPARTMENT_OTHER): Payer: PRIVATE HEALTH INSURANCE | Admitting: Rehabilitative and Restorative Service Providers"

## 2013-09-11 ENCOUNTER — Ambulatory Visit (HOSPITAL_BASED_OUTPATIENT_CLINIC_OR_DEPARTMENT_OTHER): Payer: PRIVATE HEALTH INSURANCE | Admitting: Rehabilitative and Restorative Service Providers"

## 2014-03-01 ENCOUNTER — Ambulatory Visit (HOSPITAL_BASED_OUTPATIENT_CLINIC_OR_DEPARTMENT_OTHER): Payer: PRIVATE HEALTH INSURANCE | Admitting: Internal Medicine

## 2014-03-01 ENCOUNTER — Encounter (HOSPITAL_BASED_OUTPATIENT_CLINIC_OR_DEPARTMENT_OTHER): Payer: Self-pay | Admitting: Internal Medicine

## 2014-03-01 VITALS — BP 154/100 | HR 74 | Temp 98.2°F | Wt 215.0 lb

## 2014-03-01 DIAGNOSIS — M79605 Pain in left leg: Secondary | ICD-10-CM

## 2014-03-01 DIAGNOSIS — F17200 Nicotine dependence, unspecified, uncomplicated: Secondary | ICD-10-CM

## 2014-03-01 DIAGNOSIS — Z0000 Encounter for general adult medical examination without abnormal findings: Principal | ICD-10-CM

## 2014-03-01 DIAGNOSIS — M79604 Pain in right leg: Secondary | ICD-10-CM

## 2014-03-01 DIAGNOSIS — I10 Essential (primary) hypertension: Secondary | ICD-10-CM

## 2014-03-01 DIAGNOSIS — Z23 Encounter for immunization: Secondary | ICD-10-CM

## 2014-03-01 DIAGNOSIS — E669 Obesity, unspecified: Secondary | ICD-10-CM

## 2014-03-01 MED ORDER — LISINOPRIL 10 MG PO TABS
10.0000 mg | ORAL_TABLET | Freq: Every day | ORAL | Status: DC
Start: 2014-03-01 — End: 2014-03-29

## 2014-03-01 NOTE — Progress Notes (Signed)
Cc: right leg pain, HTN & PE    right leg pain  Over past 2 years has been seen for pains in her leg  2/14 as well as 10/14 seen for non-specific pain in her right leg  She notes that this is not bothering her as much  But her left leg is hurting so much that for months she's not been working, exercising, feeling like a prisoner at home & feeling frustrated, all on account of her left leg pains  She notes that the pain is behind her knee, also lateral aspect of her leg, radiates down to her foot, occasionally associated with paresthesias   No weakness of her foot or leg  No lower back pain  She has been using high-dose NSAID's or Excedrin (aspirin/acetaminophen/caffeine) every other night for the pain  Pain is worse with activity & at the end of the day  Better in the morning  It is worse with certain positions such as crossing her legs in bed    HTN:  See EPIC Problem List for HPI.  She reports taking the hypertension medications as on her EPIC med list below.  She reports no difficulty with compliance or side-effects  She denies chest pain, dyspnea, headache, lower extremity edema or localized weakness or paresthesias  Her last three blood pressures in clinic were:  Most Recent BP Reading(s)  03/01/14 : 154/100  05/11/13 : 140/90  04/20/13 : 154/96  Her Most Recent Weight Reading(s)  03/01/14 : 97.523 kg (215 lb)  02/10/13 : 91.989 kg (202 lb 12.8 oz)  01/15/13 : 92.08 kg (203 lb)  01/03/13 : 92.534 kg (204 lb)    last potassium was   POTASSIUM (mmol/L)   Date Value   05/11/2013 4.0   ----------  last creatinine was   CREATININE (mg/dL)   Date Value   05/11/2013 0.5   ----------    Patient Active Problem List    Left leg pain         Priority: High [1]         Date Noted: 01/15/2013            2/14 initially had left knee pain            10/14 seen for Months of right leg pain -- hard to            pin down an etiology --             suspect musculoskeletal due to work & obesity             tx with  NSAID's & referred to PT                        12/15 PT not helpful; now reports leg pain            interfering with her life, has             stopped working, not very active            Pain appears to be musculoskeletal but neurologic            is possible            Referred to physiatry; use acetaminophen prn      Pre-diabetes         Priority: Medium [2]         Date Noted: 10/31/2011  8/13 HgA1c 6%            8/14 HgA1c 6.3%      Tobacco dependence         Priority: Medium [2]         Date Noted: 10/29/2011            Quit 7/13            2/14 continues to smoke a few cigarettes when she            goes out with friends             drinking            Quit 6/14      Obesity         Priority: Medium [2]         Date Noted: 07/25/2009            Took compounded diet pills from [place] -- in 2009,            took for 30 days      Essential hypertension, benign         Priority: Medium [2]         Date Noted: 07/25/2009            BMP, HCT, microalb, EKG wnl 2011; Treatment            started 10/13/11            Well controlled with chlorthalidone       Left knee pain         Priority: Low [3]         Date Noted: 05/12/2012            musculoskeletal pain x 10+ years            Knee strengthening exercises have helped                        10/14 saw Dr. Ursula Alert for bilateral knee pain &            referred to PT       [place], [place] Medication Instructions [id number]    Printed on:03/01/14 1048   Medication Information                      lisinopril (PRINIVIL,ZESTRIL) 10 MG tablet  Take 1 tablet by mouth daily.               Review of Patient's Allergies indicates:   Penicillins                 Comment:Took skin 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 & was positive, never             actually took it  No past medical history on file.      Past Surgical History    TUBAL LIGATION       Social History    Marital Status: Legally Separated   Spouse Name:                       Years of Education:                 Number of  children:               Social History Main Topics    Smoking Status: Former Smoker  Packs/Day: 0.33  Years: 10        Types: Cigarettes      Quit date: 10/15/2012    Comment: quit 3 months ago on 10/15/12    Alcohol Use: Yes                Comment: minimal    Drug Use: No              Sexual Activity: Yes               Partners with: Female       Birth Control/Protection: Tubal Ligation    Social History Narrative    she is originally from [place] ,[place].  She came to the [place] in 2005     In [place], she worked as a Musician.     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    She lives with boyfriend & daughter; no DV;      she has 2 children, age 1 y/o son in [place] & 37 y/o daughter lives with her        Family History    Heart Disease Father     Comment: died age 75 from MI; learned about HTN in 39's    Cancer - Other Mother     Comment: stomach cancer, died    GI Sister     Comment: hernia    Thyroid Sister       Review of symptoms:     No fevers or unexplained weight loss. No visual changes. No sore throat or ear ache.  No prolonged cough. No dyspnea or chest pain on exertion.  No abdominal pain or change in bowel habits.  No vaginal discharge or dysuria.   No new rashes or skin changes.  No pain or masses in breasts. No paresthesias or unusual headaches. No sadness or anxiety that interferes with day-to-day activities. No hot flushes. No enlarged nodes.  No new itching, sneezing or wheezing.    BP 154/100 mmHg   Pulse 74   Temp(Src) 98.2 F (36.8 C) (Oral)   Wt 97.523 kg (215 lb)   LMP 02/25/2014  General:Patient appears well, alert and oriented x 3, pleasant, cooperative.   Eyes: anicteric conjunctiva, PERLA bilaterally.   Ears: tm's wnl bilaterally without erythema or exudate.   Oropharynx: no erythema or exudate.   Neck supple and free of adenopathy, or masses. No thyromegaly.  Lymph: no enlargement of anterior cervical, posterior cervical or  supraclavicular lymph nodes   Cardiovascular: regular rate & rhythm, no murmurs, gallops or rubs appreciated.   Chest: clear to auscultation bilaterally, no crackles, rhonchi or wheezes.   Abdomen is obese, soft, no tenderness, masses or organomegaly.  Extremities: no clubbing, cyanosis or edema.    Skin is normal without suspicious lesions noted.  Mental status exam: Normal thought content, speech, affect, mood and dress are noted.  Musculoskeletal exam: within normal limits without joint swelling or deformities  Screening neurological exam is normal without focal findings.   Knee exam: full range of motion, no pain on motion, no effusion, tenderness, masses, ligamentous instability or deformity noted.   Sensory exam of upper & lower extremities extremities wnl to light touch and pinprick   Motor 5/5 upper & lower extremities equal proximally & distally     ASSESSMENT & PLAN:  (Z00.00) Routine general medical examination at a health care facility  (primary encounter diagnosis)  Comment: given fam hx of  thyroid disease will screen  Plan: THYROID SCREEN TSH REFLEX FT4        Will see Carolyn Martinique, NP for routine gyn care in April 2016 when pap due    (M79.605) Left leg pain  Comment: her pain is non-specific but most consistent with musculoskeletal pains -- this is odd in that she had similar pains on her right now much better; unfortunately, these pains are significantly limiting her activities that is then causing a serious cycle of increasing weight gain which is likely further contributing to her pain by limiting her exercise & possible contributing more mechanical stress  She reports not benefitting from PT, but actually her right leg pain appears better  I have considered orthopedic problems of her knee, hip & ankle, but this does not seem to be the case; in addition have considered sciatica, but the pattern & findings to not appear to make this dx likely  Will refer to: [place] ( INT) for  additional dx eval & treatment  For the meantime, we discussed that chronic use of high-dose NSAID's can cause serious side effects -- so would recommend trying acetaminophen 1000 mg PO TID for pain relief & then go from there          (I10) Essential hypertension  Comment: not well controlled, she notes that it's 'normal' at home, but does not have any orthostatic symptoms -- will increasing lisinopril from 2.5 mg to 10 mg & reassess in 2 weeks with RN visit -- will need repeat blood work at that time  Plan: lisinopril (PRINIVIL,ZESTRIL) 10 MG tablet,         POTASSIUM, BUN (UREA NITROGEN), CREATININE,         COLLECTION VENOUS BLOOD VENIPUNCTURE            (M79.604) Right leg pain  Comment: this has interestingly resolved      (F17.200) Tobacco dependence  Comment: she has not restarted smoking, this is essential  counseled on the importance of complete abstence from tobacco because even one cigarette creates a high risk for relapse  Provided supportive counseling.       (E66.9) Obesity (BMI 30-39.9)  Comment: we discussed lifestyle changes (including relevant aspects of diet, exercise, sleep & stress) - encouraged RD, but she declines feeling that she knows what she needs to do but hard to motivate to get it taken care of especially now that she's hardly getting any activity in due to her leg pain      The patient was ready to learn and no apparent learning or adherence barriers were identified. I explained the diagnosis and treatment plan, and the patient expressed understanding of the content. I attempted to answer any questions regarding the diagnosis and the proposed treatment.    Possible side effects of the prescribed medication was explained. We discussed the patients current medications.  We discussed the importance of medication compliance. The patient expressed understanding and no barriers to adherence were identified.    follow-up will be scheduled for:  RN in 2 wks  PCP in 3 months  Carolyn Martinique,  NP in 4 months  Physiatry soon    she has been advised to call or return with any worsening or new problems

## 2014-03-01 NOTE — Addendum Note (Signed)
Addended by: Janifer Adie on: 03/01/2014 12:03 PM     Modules accepted: Orders, SmartSet

## 2014-03-01 NOTE — Progress Notes (Addendum)
03/01/2014  Flu Vaccine   Ordered by Provider  VIS given  Confirmed patient's name and date of birth.  Pt denies allergies to this vaccine.   Pt denies allergies to egg or egg products.  Pt denies allergy to contact lens solution.   Pt denies adverse effects from previous administration of this medication. Pt denies history of Guillain-Barre' syndrome.  Pt denies moderate/severe illness at this time.  Risks and benefits of Flu Vaccine reviewed with pt. VIS for Flu Vaccine  offered and reviewed with pt.    Vaccine tolerated well by patient. Patient denies adverse effects from injection at this time. Patient encouraged to utilize arm and not favor it.  Patient informed may  take pain reliever of choice and to apply ice for discomfort if necessary.  Patient will call with any questions or concerns.  Please refer to Imm./Inj. section for administration site, lot # and exp. date.    Jana Tashina Credit, LPN

## 2014-03-29 ENCOUNTER — Ambulatory Visit (HOSPITAL_BASED_OUTPATIENT_CLINIC_OR_DEPARTMENT_OTHER): Payer: PRIVATE HEALTH INSURANCE | Admitting: Internal Medicine

## 2014-03-29 ENCOUNTER — Encounter (HOSPITAL_BASED_OUTPATIENT_CLINIC_OR_DEPARTMENT_OTHER): Payer: Self-pay | Admitting: Internal Medicine

## 2014-03-29 VITALS — BP 144/102 | HR 72 | Temp 98.0°F | Wt 219.0 lb

## 2014-03-29 DIAGNOSIS — M79605 Pain in left leg: Secondary | ICD-10-CM

## 2014-03-29 DIAGNOSIS — E669 Obesity, unspecified: Secondary | ICD-10-CM

## 2014-03-29 DIAGNOSIS — R7303 Prediabetes: Secondary | ICD-10-CM

## 2014-03-29 DIAGNOSIS — F17200 Nicotine dependence, unspecified, uncomplicated: Principal | ICD-10-CM

## 2014-03-29 DIAGNOSIS — I10 Essential (primary) hypertension: Secondary | ICD-10-CM

## 2014-03-29 LAB — POTASSIUM: POTASSIUM: 3.9 mmol/L (ref 3.5–5.1)

## 2014-03-29 LAB — BUN (UREA NITROGEN): BUN (UREA NITROGEN): 11 mg/dL (ref 7–18)

## 2014-03-29 LAB — CREATININE: CREATININE: 0.8 mg/dL (ref 0.4–1.2)

## 2014-03-29 MED ORDER — LISINOPRIL 20 MG PO TABS
20.0000 mg | ORAL_TABLET | Freq: Every day | ORAL | Status: DC
Start: 2014-03-29 — End: 2014-05-04

## 2014-03-29 NOTE — Progress Notes (Signed)
Patient here for BP check.  Patient will also need lab work as ordered at last visit per PCP.  Patient states has been taking Lisinopril 10mg  daily as prescribe. Has missed one dose on Saturday, but denies missing any more days since med increase in December.  States at home BP is 120s-130s/ 80s.   States when she comes to the clinic BP are always high- maybe due to nervousness.  Denies any HA, SOB, Chest pain, Changes in visions, palpitations, or dizziness.  Has notice a slight tickle in her throat that makes her cough from time to time, but states this does not really bother her.  Discussed that this is a common SE of lisinopril and if bothersome may indicate a change in medication.   BP in clinic is 162/110.  15 mins later BP is 160/110. Taken in Both arms.  Will send to and page provider.  Provider will see patient today.

## 2014-03-29 NOTE — Progress Notes (Signed)
follow up multiple issues    Pre-DM/obesity  She has been gaining weight in the setting of no good food practices at home with her entire family who are overweight or obese but not doing much about it  Also, when they go out, there is no effort to eat healthily  This is compounded by her recent tobacco cessation -- she has not had any cigarettes since last visit  She denies chest pain, dyspnea, headache, lower extremity edema or localized weakness or paresthesias  Her last three blood pressures in clinic were:  Most Recent BP Reading(s)  03/29/14 : 144/102  03/01/14 : 154/100  05/11/13 : 140/90  Her Most Recent Weight Reading(s)  03/29/14 : 99.338 kg (219 lb)  03/01/14 : 97.523 kg (215 lb)  02/10/13 : 91.989 kg (202 lb 12.8 oz)  01/15/13 : 92.08 kg (203 lb)    last potassium was   POTASSIUM (mmol/L)   Date Value   05/11/2013 4.0   ----------  last creatinine was   CREATININE (mg/dL)   Date Value   05/11/2013 0.5   ----------     HTN:  See EPIC Problem List for HPI.  She reports taking the hypertension medications as on her EPIC med list below.  She reports no difficulty with compliance or side-effects  She denies chest pain, dyspnea, headache, lower extremity edema or localized weakness or paresthesias  Her last three blood pressures in clinic were:  Most Recent BP Reading(s)  03/29/14 : 144/102  03/01/14 : 154/100  05/11/13 : 140/90  Her Most Recent Weight Reading(s)  03/29/14 : 99.338 kg (219 lb)  03/01/14 : 97.523 kg (215 lb)  02/10/13 : 91.989 kg (202 lb 12.8 oz)  01/15/13 : 92.08 kg (203 lb)    last potassium was   POTASSIUM (mmol/L)   Date Value   05/11/2013 4.0   ----------  last creatinine was   CREATININE (mg/dL)   Date Value   05/11/2013 0.5   ----------    left leg pain  Unchanged since last visit  Interferes with her ability to exercise      ROS: No fevers or unexplained weight loss. No new headaches. No shortness of breath or chest pain.     BP 144/102 mmHg   Pulse 72   Temp(Src) 98 F  (36.7 C) (Oral)   Wt 99.338 kg (219 lb)   SpO2 99%   LMP 02/25/2014  Heart: S1 and S2 normal, no murmurs, clicks, gallops or rubs. Regular rate and rhythm.   Lungs:  clear; no wheezes, rhonchi or rales.    ASSESSMENT & PLAN:  (F17.200) Tobacco dependence  (primary encounter diagnosis)  Comment: counseled on the importance of complete abstence from tobacco because even one cigarette creates a high risk for relapse  Provided supportive counseling for 3- 5 min    (I10) Essential hypertension, benign  Comment: not well controlled, there are several options here, might be increasing because of her weight gain and lack of adequate lisinopril dose -- she is not having any orthostatic symptoms, so will increase lisinopril from 10 mg to 20 mg today & repeat BP check with RN in 2-3 weeks at which time she'll need repeat K/Cr which have been placed as futures -- will also check today:  Plan: POTASSIUM, COLLECTION VENOUS BLOOD         VENIPUNCTURE, BUN (UREA NITROGEN), CREATININE           (R73.09) Pre-diabetes  Comment: unfortunately, her weight gain does not  bode well for her pre-DM status turning into DM -- no current symptoms -- but will need to monitor closely & will check today  Plan: HEMOGLOBIN A1C           (M79.605) Left leg pain  Comment: will see physiatrist in Feb which I think is the best plan      (E66.9) Obesity (BMI 30-39.9)  Comment: discussed basic principles of dieting today including:  - minimizing eating fried foods  - keeping portion sizes small, especially in respect to pasta, breads & desserts  - eating a lot of vegetables while cooking these in only a little bit of oil  - getting regular exercise, ideally 30 minutes on most days of the week  - reviewed that meds are not recommended as they often have side effects and she will gain the weight again after stopping them  Declines RD referral today.         The patient was ready to learn and no apparent learning or adherence barriers were identified. I  explained the diagnosis and treatment plan, and the patient expressed understanding of the content. I attempted to answer any questions regarding the diagnosis and the proposed treatment.    Possible side effects of the prescribed medication was explained. We discussed the patients current medications.  We discussed the importance of medication compliance. The patient expressed understanding and no barriers to adherence were identified.    follow-up will be scheduled for 3 weeks from now with RN BP check  she has been advised to call or return with any worsening or new problems

## 2014-03-30 LAB — HEMOGLOBIN A1C
ESTIMATED AVERAGE GLUCOSE: 148 (ref 74–160)
HEMOGLOBIN A1C: 6.8 % — ABNORMAL HIGH (ref 4.0–5.6)

## 2014-03-31 ENCOUNTER — Telehealth (HOSPITAL_BASED_OUTPATIENT_CLINIC_OR_DEPARTMENT_OTHER): Payer: Self-pay | Admitting: Internal Medicine

## 2014-03-31 DIAGNOSIS — R7303 Prediabetes: Principal | ICD-10-CM

## 2014-03-31 NOTE — Progress Notes (Signed)
Hi Amanda Cervantes,     Please call pt to let her know that I received her lab tests.    Her HgA1c had been in pre-DM range  Now in DM range    This is one test, so doesn't confirm dx yet, but given weight -- likely developed    Will be very important to keep March apt as will need start medication -- in addition, see where she is about RD    Thanks, Bennye Alm

## 2014-03-31 NOTE — Progress Notes (Signed)
Called and spoke with pt. Discussed results. Says she has already started making changes since she was here this week. She is interested in RD- will forward to pcp to place the referral. Will need to be called with the appt.

## 2014-04-06 ENCOUNTER — Telehealth (HOSPITAL_BASED_OUTPATIENT_CLINIC_OR_DEPARTMENT_OTHER): Payer: Self-pay

## 2014-04-06 NOTE — Progress Notes (Signed)
Telephone call to pt. - appt. Scheduled with nutritionist on 04/20/14.     Hi Amanda Cervantes, please work with pt to schedule a RD apt she can make. Thanks, [employer]

## 2014-04-20 ENCOUNTER — Ambulatory Visit (HOSPITAL_BASED_OUTPATIENT_CLINIC_OR_DEPARTMENT_OTHER): Payer: PRIVATE HEALTH INSURANCE | Admitting: Registered"

## 2014-04-20 ENCOUNTER — Ambulatory Visit (HOSPITAL_BASED_OUTPATIENT_CLINIC_OR_DEPARTMENT_OTHER): Payer: PRIVATE HEALTH INSURANCE | Admitting: Ambulatory Care

## 2014-04-23 ENCOUNTER — Ambulatory Visit (HOSPITAL_BASED_OUTPATIENT_CLINIC_OR_DEPARTMENT_OTHER): Payer: PRIVATE HEALTH INSURANCE | Admitting: Physical Medicine & Rehabilitation

## 2014-05-04 ENCOUNTER — Ambulatory Visit (HOSPITAL_BASED_OUTPATIENT_CLINIC_OR_DEPARTMENT_OTHER): Payer: PRIVATE HEALTH INSURANCE | Admitting: Registered Nurse

## 2014-05-04 ENCOUNTER — Other Ambulatory Visit (HOSPITAL_BASED_OUTPATIENT_CLINIC_OR_DEPARTMENT_OTHER): Payer: Self-pay | Admitting: Internal Medicine

## 2014-05-04 ENCOUNTER — Ambulatory Visit (HOSPITAL_BASED_OUTPATIENT_CLINIC_OR_DEPARTMENT_OTHER): Payer: PRIVATE HEALTH INSURANCE | Admitting: Registered"

## 2014-05-04 VITALS — BP 148/102

## 2014-05-04 VITALS — Wt 211.0 lb

## 2014-05-04 DIAGNOSIS — I10 Essential (primary) hypertension: Principal | ICD-10-CM

## 2014-05-04 DIAGNOSIS — R7303 Prediabetes: Principal | ICD-10-CM

## 2014-05-04 DIAGNOSIS — E669 Obesity, unspecified: Secondary | ICD-10-CM

## 2014-05-04 DIAGNOSIS — E6609 Other obesity due to excess calories: Secondary | ICD-10-CM

## 2014-05-04 LAB — THYROID SCREEN TSH REFLEX FT4: THYROID SCREEN TSH REFLEX FT4: 1.53 u[IU]/mL (ref 0.358–3.740)

## 2014-05-04 LAB — BUN (UREA NITROGEN): BUN (UREA NITROGEN): 11 mg/dL (ref 7–18)

## 2014-05-04 LAB — CREATININE: CREATININE: 0.9 mg/dL (ref 0.4–1.2)

## 2014-05-04 LAB — POTASSIUM: POTASSIUM: 4 mmol/L (ref 3.5–5.1)

## 2014-05-04 MED ORDER — LISINOPRIL 30 MG PO TABS
30.0000 mg | ORAL_TABLET | Freq: Every day | ORAL | Status: DC
Start: 2014-05-04 — End: 2014-08-06

## 2014-05-04 NOTE — Progress Notes (Signed)
Patient here for BP Check.   States she feels great, but she felt fine last and BP was still high.  States has been taking BP medication as prescribe (Lisinopril 20mg  daily)  Has taken it this am.  Denies SE from medication.  Denies SOB, Chest pain, dizziness, changes in vision, or lightheadedness.  BP in clinic taken 2 x 148/102  Page PCP, Covering PCP called RN.  Recommends patient increase Lisinopril to 30 mg, repeat BP check in 1 month.  Discussed with patient, agrees with plan.  Will also get repeat lab work today.

## 2014-05-04 NOTE — Progress Notes (Signed)
NUTRITION RE-ASSESSMENT / INTERVENTION    Beginning Time: 1:30 PM  End Time: 2:15 PM     Total Minutes: 45 minutes    Amanda Cervantes is a 49 year old female who is at nutrition visit for re-assessment for preDM and obesity.  Visit via [place] interpreter, Monia Pouch, via phone. Patient's language is [place] and [place].      Subjective (including lifestyle changes): Patient reports    Has pain in her left leg at night but "no one can figure out what's going on." Pain goes from waist to her foot - feels like sharp pain but also "that something is squeezing the back of her knee" and then the pain spread. This prevents her from walking and from working (recently re-started work d/t financial need). Pain started 2 years ago. Scheduled to see orthopedic doctor    Has already made changes to her diet - more vegetables, quinoa + cabbage, chayote, and more vegetables like lettuce, tomato. Between meals having fruit and / or yogurt    The problem is in her and her family. She feels no one in her family helps her with the changes she's trying to make. She ends up making 2 kinds of food at meals : 1 for herself and another for her family (traditional foods). Breaks diet b/c ends up tired of cooking 2 meals. She feels her family should follow her own diet b/c they too need to lose weight. Reports her daughter weighs more than she (the patient). Husband is willing to follow the patient's diet but she feels guilty b/c he has an active job.         Family History    Heart Disease Father     Comment: died age 28 from MI; learned about HTN in 59's    Cancer - Other Mother     Comment: stomach cancer, died    GI Sister     Comment: hernia    Thyroid Sister          Current Outpatient Prescriptions:  lisinopril (PRINIVIL,ZESTRIL) 20 MG tablet Take 1 tablet by mouth daily. Disp: 90 tablet Rfl: 3     No current facility-administered medications for this visit.    Vitamins/Minerals/Herbal Supplements: None         Smoking  status: Former Smoker  0.33 Packs/Day  For 10.00 Years     Types: Cigarettes    Quit date: 10/15/2012    Smokeless tobacco: Not on file    Comment: quit 3 months ago on 10/15/12    Alcohol Use: Yes    Comment: minimal       Review of Patient's Allergies indicates:   Penicillins                 Comment:Took skin 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 & was positive, never             actually took it    Labs reviewed: Yes     Vitals:  Most Recent BP Reading(s)  03/29/14 : 144/102      Most Recent Height Reading(s)  07/25/09 : 5' 2.99" (1.6 m)      Most Recent Weight Reading(s)  05/04/14 : 95.709 kg (211 lb)  03/29/14 : 99.338 kg (219 lb)  03/01/14 : 97.523 kg (215 lb)  02/10/13 : 91.989 kg (202 lb 12.8 oz)  01/15/13 : 92.08 kg (203 lb)     Weight change: Lost 8 pounds in the past 1 month(s).    Estimated body  mass index is 38.80 kg/(m^2) as calculated from the following:    Height as of 07/25/09: 5' 2.99" (1.6 m).    Weight as of 03/29/14: 99.338 kg (219 lb).  BMI Category: 35-39.9 obesity class II     Physical Activity:  Type: not physically active     Barriers to physical activity include: physical    Meal sources: home cooked  Psychosocial factors / Mental Status: interactive  Stress: Fair  Readiness to learn: moderate    Dietary history / usual intake reveals patient follows reduced calories and low carbohydrate diet.  She eats 3 meals and 1-2 snacks per day.    Portion size is appropriate.  Eating frequency is appropriate  Eating habits are based upon efforts to lose weight and lower sugar    Estimated intake shows: sub-optimal nutrients: none  excessive nutrients: none    Assessment/Plan/Conclusion:  Patient presents with inconsistent adherence to low carb / low kcal diet related to inconsistent family support as evidenced by patient's reported efforts over the past 3 years but need to cook separate meals for family members.   Patient has knowledge of what she "should" eat but struggles to maintain diet d/t family's lack of involvement /  approval. She gets tired of cooking separate meals    Reviewed the following nutrition education:  Pathophysiology of diabetes  Methods to improve insulin resistance - exercise 150 min/wk, weight loss,  less saturated fat, smoking cessation, moderate alcohol (achieved)  Impact of carbohydrate on blood glucose levels  Dietary sources of carbohydrate - role of quality and quantity  Label reading for carbohydrate content   Emphasized can eat traditional foods but in the proper portion    Goals:   Based upon the education and discussion, patient and provider arrive at the following goals:   Limit starch (rice, potato, pasta, beans) to 1 cup, 1-2x/day   Increase vegetables at all meals   No frying foods   Ok to have 1 fruit or 1 yogurt between meals     Barriers to dietary changes include: family not in agreement    Material provided:client goals; CHO visual    Future educational needs: sodium    Referred to: None    Follow-up: No follow up scheduled at this time per pt request    Anna Genre, RD,LDN,CDE

## 2014-05-04 NOTE — Progress Notes (Signed)
RN requested that I sign for labs for pieter that had expired. Ordered as requested

## 2014-05-04 NOTE — Addendum Note (Signed)
Addended by: Hinda Glatter on: 05/04/2014 04:01 PM     Modules accepted: Orders

## 2014-05-05 ENCOUNTER — Encounter (HOSPITAL_BASED_OUTPATIENT_CLINIC_OR_DEPARTMENT_OTHER): Payer: Self-pay | Admitting: Internal Medicine

## 2014-05-06 ENCOUNTER — Ambulatory Visit (HOSPITAL_BASED_OUTPATIENT_CLINIC_OR_DEPARTMENT_OTHER): Payer: PRIVATE HEALTH INSURANCE | Admitting: Physical Medicine & Rehabilitation

## 2014-05-06 ENCOUNTER — Encounter (HOSPITAL_BASED_OUTPATIENT_CLINIC_OR_DEPARTMENT_OTHER): Payer: Self-pay | Admitting: Physical Medicine & Rehabilitation

## 2014-05-06 VITALS — BP 155/99 | HR 76 | Temp 98.3°F | Ht 64.17 in | Wt 211.0 lb

## 2014-05-06 DIAGNOSIS — M170 Bilateral primary osteoarthritis of knee: Secondary | ICD-10-CM

## 2014-05-06 DIAGNOSIS — M7070 Other bursitis of hip, unspecified hip: Principal | ICD-10-CM

## 2014-05-06 DIAGNOSIS — M545 Low back pain: Secondary | ICD-10-CM

## 2014-05-06 DIAGNOSIS — M7918 Myalgia, other site: Secondary | ICD-10-CM

## 2014-05-06 DIAGNOSIS — E669 Obesity, unspecified: Secondary | ICD-10-CM

## 2014-05-06 LAB — XR KNEE LEFT 3 VIEWS

## 2014-05-06 LAB — XR KNEES STANDING AP ONLY BILATERAL

## 2014-05-06 MED ORDER — LIDOCAINE HCL 1 % IJ SOLN
5.00 mL | Freq: Once | INTRAMUSCULAR | Status: AC
Start: 2014-05-06 — End: 2014-05-06

## 2014-05-06 MED ORDER — ACETAMINOPHEN 500 MG PO TABS
1000.0000 mg | ORAL_TABLET | Freq: Three times a day (TID) | ORAL | Status: DC | PRN
Start: 2014-05-06 — End: 2014-07-12

## 2014-05-06 MED ORDER — IBUPROFEN 800 MG PO TABS
800.0000 mg | ORAL_TABLET | Freq: Three times a day (TID) | ORAL | Status: DC | PRN
Start: 2014-05-06 — End: 2014-07-12

## 2014-05-06 MED ORDER — TRIAMCINOLONE ACETONIDE 40 MG/ML IJ SUSP
40.00 mg | Freq: Once | INTRAMUSCULAR | Status: AC
Start: 2014-05-06 — End: 2014-05-06

## 2014-05-06 NOTE — Patient Instructions (Signed)
Acetaminophen (Tylenol ES) 500 mg:  2 tablets every 8 hours as needed.  Do not use more than 6 per day.        Ibuprofen (Motrin, Advil) 800 mg tab:  One tablet with food every 8 hours as needed

## 2014-05-06 NOTE — Progress Notes (Signed)
Date of Service: 05/06/2014    HISTORY OF PRESENT ILLNESS:  The patient is a pleasant 49 year old woman whom I am seeing at the request of Dr. Patrice Paradise for back and left leg pain.  History was obtained with the help of a [place] interpreter via telephone.  The patient presents with mostly pain along the lower limb.  She points to the anterolateral aspect of her thigh as being tender, at times radiating to the lower limb.  The thigh pain is worsened when she lays on her side at night, particularly on the left side.  This is more noticeable with increased activity.  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  She also describes pain along both knees, left worse than right.  The knee pain is more noticeable with the use of stairs or with prolonged walking or after prolonged sitting.  She denies any leg giveway, bowel/bladder changes.  Denies any numbness or tingling sensation involving the lower limb.  She rates the pain as a 6/10 in intensity at its worst.  She tried physical therapy in the past but did not find it to be particularly helpful.  She was recently given ibuprofen, which she does not use on a regular basis.      She has not had any recent imaging study of the lumbar spine or lower limbs.    Laboratory studies, which include a TSH, potassium, BUN were within normal limit.  Her hemoglobin A1c in January of this year was slightly elevated at 6.8.    REVIEW OF SYSTEMS:  Please refer to the spine questionnaire for detailed review of systems.  She denies fever, night sweats, chills, nausea, vomiting, recurrent cough or pneumonia, or significant weight change in the last few months, bowel/bladder incontinence, or skin changes.    PAST MEDICAL AND SURGICAL HISTORY:  1.  Obesity.  2.  History of nicotine dependence, currently in remission.  3.  Essential hypertension.  4.  Prediabetes.  5.  Knee pain.    CURRENT MEDICATIONS:  1.   Lisinopril 30 mg daily.  2.  Ibuprofen as needed.    ALLERGIES:  PENICILLIN.    FAMILY HISTORY:  Noncontributory.    SOCIAL HISTORY:  She is originally from [place], lives with her boyfriend.  She has 2 children, 1 here and 1 in [place].  She smokes a quarter of a pack of cigarettes per day for 10 years.  She quit smoking a year ago.  She drinks alcohol on rare occasion and denies any illicit drug use.    PHYSICAL EXAMINATION:  GENERAL:  Pleasant, 5 feet 4 inches, 95.7 kg (BMI of 43.22) 49 year old woman who is alert, oriented to time and place, with appropriate mood and affect.  VITAL SIGNS:  Blood pressure 155/99, heart rate of 76, temperature of 98.3 degrees Fahrenheit.  GAIT:  Normal.  STRENGTH TESTING:  Bilateral upper and lower extremity strength is normal and symmetric.  No atrophy or tone abnormalities noted.  NEUROLOGIC:  Bilateral upper and lower extremity coordination and muscle stretch reflexes are physiologic and symmetric.  Plantar responses are downgoing.  SENSATION:  No loss of sensation is noted.  SPINE:  No tenderness on the lumbar/lumbosacral paraspinous muscle.  STRAIGHT LEG RAISING:  Negative for radicular pain with sitting and supine position bilaterally.  PALPATION:  Revealed exquisite tenderness in the greater trochanteric bursa region bilaterally, left worse than right.  PERIPHERAL  JOINT RANGE OF MOTION:  There is crepitance involving both knees without any edema, erythema, or increased warmth.  She is tender along the medial joint line.  There was pain reproduction with flexion beyond 90 degrees.  Valgus varus stress test, Lachman's, McMurray's were negative.  PERIPHERAL VASCULAR:  Pedal pulses are intact.  No obvious extremity swelling in all 4 extremities.  SKIN:  Grossly unremarkable.    IMPRESSION REPORT PLAN:  This is a pleasant, obese 49 year old woman who presents with clinical findings consistent with bilateral knee osteoarthritis, left worse than right, and greater trochanteric bursa  region pain.  There was no evidence of a lumbar nerve impingement on examination today.    I will obtain x-rays of the knee joint, which shows bilateral knee osteoarthritis, left worse than right.  At this point, I recommend the following:  1.  A trial of left intraarticular knee steroid injection for symptomatic relief of pain.  2.  A retrial of physical therapy to address knee and greater trochanteric bursa region pain.  3.  I strongly recommend that she continues her effort to lose weight, as this will improve her overall cardiopulmonary health as well as joint pain.  4.  I ordered a vitamin D level.  5.  She may use acetaminophen 500 mg 2 tablets twice to 3 times daily as needed.  This may be supplemented with ibuprofen 800 mg 1 tablet every 8 to 12 hours as needed with food.  I also recommend a trial of glucosamine/chondroitin 1 tablet per day.  I will see her in followup in approximately 2 or 3 months.  Certainly, if she has questions prior to that, she will contact me by phone for earlier appointment.    PROCEDURE PERFORMED:  Left knee intraarticular steroid injection.  INDICATION:  Left knee osteoarthritis.    The risk and benefit, as well as alternative treatment, were discussed with the patient.  They included but not necessarily limited to infection, allergic reaction, local tissue breakdown, nerve injury, paresis, and possible inefficacy of the treatment.  She expressed full understanding and agreed to proceed.    She was placed in a seated position.  Sterile technique was observed throughout the procedure.  The skin was prepped with ChloraPrep x3.  Using a 22-gauge 1-1/2 inch needle, a total of 4 mL of 1% lidocaine and 40 mg of Kenalog were injected along the anterolateral aspect of the left knee.  She tolerated the procedure well without any complication, was kept for observation for an appropriate period of time before being discharged home in good condition and upon her own  volition.    ___________________________  Reviewed and Electronically Signed By: Deri Fuelling MD  Sig Date: 05/06/2014  Sig Time: 15:20:20  Dictated By: Deri Fuelling MD  Dict Date: 05/06/2014 Dict Time: 12 24 PM    Dictation Date and Time:05/06/2014 12:24:53  Transcription Date and Time:05/06/2014 14:48:00  eScription Dictation id: [id number] Confirmation # :[id number]      cc: Gavin Pound MD

## 2014-05-06 NOTE — Progress Notes (Signed)
.  This office note has been dictated. Account number [id number]    The primary progress note for this visit has been dictated through E-Scription. It can be viewed as an attachment to this encounter or through Chart Review under the Notes Tab as an Orthopedic Office Note.      Review of Systems: Constitutional, Eyes, ENT/Mouth, Cardiovascular, Respiratory, GI, GU, Neuro, Psych, Heme/Lymph, Skin, Musculoskeletal was reviewed and is NEGATIVE except for what is dictated in the note.    MT ACCT #:  [id number]  PATIENT/PROCEDURE VERIFICATION DOCUMENTATION    Correct patient: Yes  Correct procedure: Yes  Correct site, mark visible if applicable: Yes    Pre-procedure Vital Signs:  BP: N/A  P: N/A  R: N/A    Risks and Benefits reviewed: Yes  Side: Left  Correct position: Yes  Special equipment/implant(s) present, if applicable: Yes    Post-procedure Vitals Signs:  BP: N/A  P: N/A  R: N/A    Time-out completed, documented by provider doing procedure or designated team member:  Bayan Hedstrom A. Ena Dawley, MD    05/06/2014    12:08 PM

## 2014-05-14 ENCOUNTER — Ambulatory Visit (HOSPITAL_BASED_OUTPATIENT_CLINIC_OR_DEPARTMENT_OTHER): Payer: PRIVATE HEALTH INSURANCE | Admitting: Lab

## 2014-05-14 DIAGNOSIS — M7918 Myalgia, other site: Secondary | ICD-10-CM

## 2014-05-14 DIAGNOSIS — I10 Essential (primary) hypertension: Principal | ICD-10-CM

## 2014-05-14 LAB — VITAMIN D,25 HYDROXY: VITAMIN D,25 HYDROXY: 22 ng/mL — ABNORMAL LOW (ref 30.0–100.0)

## 2014-05-14 NOTE — Progress Notes (Signed)
Labs drawn. kd

## 2014-05-25 ENCOUNTER — Telehealth (HOSPITAL_BASED_OUTPATIENT_CLINIC_OR_DEPARTMENT_OTHER): Payer: Self-pay | Admitting: Physical Medicine & Rehabilitation

## 2014-05-25 MED ORDER — ERGOCALCIFEROL 1.25 MG (50000 UT) PO CAPS
50000.0000 [IU] | ORAL_CAPSULE | ORAL | Status: DC
Start: 2014-05-25 — End: 2018-04-21

## 2014-05-25 NOTE — Progress Notes (Signed)
The patient's Vitamin D level was below normal range (22). A prescription for Vit D supplement was sent to the [facility] in [place] at her request.  She is  is to take one Vit D 50,000 IU per week for a total of 12 weeks (3 months).   The knee x-ray showed degenerative changes.  She is to continue with medications and physical therapy as previously discussed.  I will see her in FU on 4.25.16.    I answered all of her questions.   She expressed full understanding of our conversations, and agreed to proceed as above.

## 2014-05-31 ENCOUNTER — Ambulatory Visit (HOSPITAL_BASED_OUTPATIENT_CLINIC_OR_DEPARTMENT_OTHER): Payer: PRIVATE HEALTH INSURANCE | Admitting: Internal Medicine

## 2014-06-01 ENCOUNTER — Ambulatory Visit (HOSPITAL_BASED_OUTPATIENT_CLINIC_OR_DEPARTMENT_OTHER): Payer: PRIVATE HEALTH INSURANCE | Admitting: Ambulatory Care

## 2014-06-11 ENCOUNTER — Encounter (HOSPITAL_BASED_OUTPATIENT_CLINIC_OR_DEPARTMENT_OTHER): Payer: Self-pay | Admitting: Internal Medicine

## 2014-06-11 ENCOUNTER — Ambulatory Visit (HOSPITAL_BASED_OUTPATIENT_CLINIC_OR_DEPARTMENT_OTHER): Payer: PRIVATE HEALTH INSURANCE | Admitting: Internal Medicine

## 2014-06-11 VITALS — BP 138/88 | HR 81 | Temp 98.4°F | Wt 205.0 lb

## 2014-06-11 DIAGNOSIS — I10 Essential (primary) hypertension: Secondary | ICD-10-CM

## 2014-06-11 DIAGNOSIS — R7303 Prediabetes: Principal | ICD-10-CM

## 2014-06-11 DIAGNOSIS — F17200 Nicotine dependence, unspecified, uncomplicated: Secondary | ICD-10-CM

## 2014-06-11 DIAGNOSIS — M79605 Pain in left leg: Secondary | ICD-10-CM

## 2014-06-11 DIAGNOSIS — E669 Obesity, unspecified: Secondary | ICD-10-CM

## 2014-06-11 NOTE — Progress Notes (Signed)
Cc: DM vs pre-DM; left leg pain; obesity    Her last HbA1c was   HEMOGLOBIN A1C (%)   Date Value   03/29/2014 6.8*   11/06/2012 6.3*   10/29/2011 6.0*   ----------  our primary goal is HgA1c is <8%      Her Most Recent Weight Reading(s)  06/11/14 : 92.987 kg (205 lb)  05/06/14 : 95.709 kg (211 lb)  05/04/14 : 95.709 kg (211 lb)  03/29/14 : 99.338 kg (219 lb)  03/01/14 : 97.523 kg (215 lb)  She denies polyuria, polydipsia, blurry vision, chest pain, dyspnea or claudication.  No foot burning, numbness or pain.       She denies feeling lightheaded or tremulous.    Please see EPIC RHM field for last visit to ophtho & podiatry.    Her last microalbumin was:     ALB/CREAT RATIO URINE RAN (ug/mg)   Date Value   10/13/2011 9   ----------  last potassium was   POTASSIUM (mmol/L)   Date Value   05/04/2014 4.0   ----------  last creatinine was   CREATININE (mg/dL)   Date Value   05/04/2014 0.9   ----------  last   ESTIMATED GLOMERULAR FILT RATE (ML/MIN)   Date Value   10/13/2011 > 60   ----------      LOW DENSITY LIPOPROTEIN DIRECT (mg/dl)   Date Value   10/29/2011 101*   ----------    Her goal BP is <140/85 based on my interpretation of the following trials of BP control in DM pts: ABCD, HOT, UKPDS & ACCORD (NEJM [id number]), her last BP was Most Recent BP Reading(s)  06/11/14 : 138/88  05/06/14 : 155/99     She reports  taking medicines as below   [place] [place] Medication Instructions [id number]    Printed on:06/11/14 1021   Medication Information                      acetaminophen (TYLENOL) 500 MG tablet  Take 2 tablets by mouth 3 (three) times daily as needed for Pain.             ergocalciferol (VITAMIN D2) 50000 UNIT capsule  Take 1 capsule by mouth once a week.             ibuprofen (ADVIL,MOTRIN) 800 MG tablet  Take 1 tablet by mouth every 8 (eight) hours as needed for Pain (with food).             lisinopril (PRINIVIL,ZESTRIL) 30 MG tablet  Take 1 tablet by mouth daily.               Review  of Patient's Allergies indicates:   Penicillins                 Comment:Took skin 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 & was positive, never             actually took it  Social History Narrative    she is originally from [place] ,[place].  She came to the [place] in 2005     In [place], she worked as a Musician.     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 No new headaches. No shortness of breath or chest pain.     BP 138/88 mmHg   Pulse 81   Temp(Src) 98.4 F (36.9 C) (Oral)   Wt 92.987 kg (205 lb)   LMP 05/20/2014 (Approximate)  Heart: S1 and S2 normal, no murmurs, clicks, gallops or rubs. Regular rate and rhythm.   Lungs:  clear; no wheezes, rhonchi or rales.    ASSESSMENT & PLAN:  (R73.09) Pre-diabetes --???DM  (primary encounter diagnosis)  Comment: last measurement was in DM range, but she's been doing a lot of hard work to lose weight in a healthy manner, has seen RD, not thinking that returning is necessary at this point  Plan: HEMOGLOBIN A1C, COLLECTION VENOUS BLOOD         VENIPUNCTURE            (M79.605) Left leg pain  Comment: her pain is from osteoarthritis & we discussed this in great detail -- focus on healthy weight loss as well as non-weight bearing exercise       (E66.9) Obesity (BMI 30-39.9)  Comment: has been losing weight since elevated HgA1c -- we discussed in great detail  discussed basic principles of dieting today including:  - minimizing eating fried foods  - keeping portion sizes small, especially in respect to pasta, breads & desserts  - eating a lot of vegetables while cooking these in only a little bit of oil  - getting regular exercise, ideally 30 minutes on most days of the week  - reviewed that meds are not recommended as they often have side effects and she will gain the weight again after  stopping them     (I10) Essential hypertension, benign  Comment: well controlled continue current regimen       (F17.200) Tobacco dependence  Comment: counseled on the importance of complete abstence from tobacco because even one cigarette creates a high risk for relapse  Provided supportive counseling.       The patient was ready to learn and no apparent learning or adherence barriers were identified. I explained the diagnosis and treatment plan, and the patient expressed understanding of the content. I attempted to answer any questions regarding the diagnosis and the proposed treatment.    Possible side effects of the prescribed medication was explained. We discussed the patients current medications.  We discussed the importance of medication compliance. The patient expressed understanding and no barriers to adherence were identified.    follow-up will be scheduled for 3 months from now  she has been advised to call or return with any worsening or new problems

## 2014-06-14 ENCOUNTER — Other Ambulatory Visit (HOSPITAL_BASED_OUTPATIENT_CLINIC_OR_DEPARTMENT_OTHER): Payer: Self-pay | Admitting: Internal Medicine

## 2014-06-14 DIAGNOSIS — R7303 Prediabetes: Principal | ICD-10-CM

## 2014-06-14 LAB — HEMOGLOBIN A1C
ESTIMATED AVERAGE GLUCOSE: 128 (ref 74–160)
HEMOGLOBIN A1C: 6.1 % — ABNORMAL HIGH (ref 4.0–5.6)

## 2014-06-14 NOTE — Progress Notes (Signed)
Quick Note:    Amanda Cervantes,  Let her know the blood test is back down in the PRE-diabetes range - - this is very good news but risk of developing diabetes remains quite high & needs to watch her weight closely -- will repeat HgA1c early next year.   Thanks, Scientist, forensic  ______

## 2014-07-02 ENCOUNTER — Ambulatory Visit (HOSPITAL_BASED_OUTPATIENT_CLINIC_OR_DEPARTMENT_OTHER): Payer: PRIVATE HEALTH INSURANCE | Admitting: Internal Medicine

## 2014-07-12 ENCOUNTER — Ambulatory Visit (HOSPITAL_BASED_OUTPATIENT_CLINIC_OR_DEPARTMENT_OTHER): Payer: PRIVATE HEALTH INSURANCE | Admitting: Physical Medicine & Rehabilitation

## 2014-07-12 ENCOUNTER — Encounter (HOSPITAL_BASED_OUTPATIENT_CLINIC_OR_DEPARTMENT_OTHER): Payer: Self-pay | Admitting: Physical Medicine & Rehabilitation

## 2014-07-12 DIAGNOSIS — M1712 Unilateral primary osteoarthritis, left knee: Principal | ICD-10-CM

## 2014-07-12 DIAGNOSIS — E669 Obesity, unspecified: Secondary | ICD-10-CM

## 2014-07-12 MED ORDER — IBUPROFEN 800 MG PO TABS
800.0000 mg | ORAL_TABLET | Freq: Three times a day (TID) | ORAL | Status: DC | PRN
Start: 2014-07-12 — End: 2015-03-04

## 2014-07-12 MED ORDER — ACETAMINOPHEN 500 MG PO TABS
1000.0000 mg | ORAL_TABLET | Freq: Three times a day (TID) | ORAL | Status: DC | PRN
Start: 2014-07-12 — End: 2015-03-04

## 2014-07-12 NOTE — Progress Notes (Signed)
.  This office note has been dictated. Account number [id number]

## 2014-07-12 NOTE — Progress Notes (Signed)
Date of Service: 07/12/2014    HISTORY OF PRESENT ILLNESS:  The patient is a pleasant 49 year old woman who returns today for a followup visit.  I last saw her on May 06, 2014.  Please refer to my previous note for more details.  She was evaluated for left knee pain when last seen.  She reports significant improvement of her knee pain since.  In fact, the pain is manageable, and she rates the pain at worst at a 3/10 in intensity.  She did not participate in physical therapy as prescribed.  However, she did join a gym and participates in aquatic exercise which she finds quite beneficial.  The pain that she had initially along the anterior aspect of her thigh has subsided considerably.  She now describes occasional pain along the anteromedial aspect of the knee mainly.  She has not had any swelling, erythema, or increased warmth involving the knee joint, nor has she had any leg giveway.    Most recent knee x-rays from February 2016 showed mild-to-moderate degenerative arthritis of the left knee, and also there was evidence of mild varus angulation of the left knee.  The right knee was unremarkable.    She now uses medication, namely ibuprofen and acetaminophen, on rare occasion as the pain has improved significantly.    When last seen she was found to have vitamin D deficiency.  She continues to use the vitamin D supplement as previously prescribed.  Of note, the patient also uses glucosamine chondroitin on a regular basis which she finds helpful.    She has lost a few pounds since last seen with exercising.  The patient was not specific.  She otherwise denies fever, night sweats, chills, nausea, vomiting, recurrent cough or pneumonia, or significant weight change in the last few months, bowel or bladder incontinence, or skin changes.    PHYSICAL EXAMINATION:  GENERAL:  This is a pleasant, overweight 49 year old woman who is alert, oriented to time, person, and place with appropriate mood and affect.  GAIT:   Normal.  STRENGTH TESTING:  There is normal strength in lower limbs without any tone abnormality or atrophy.  PERIPHERAL JOINT RANGE OF MOTION:  There is full active range of motion throughout without any obvious instability or laxity.  There was some slight crepitance involving the knee joints bilaterally.  No tenderness along the medial and lateral joint line.  Valgus and varus stress test, Lachman's, and McMurray's of the knee joint were negative.  No reproduction of pain with range of motion.  She did have slight crepitance involving both knees.  In the left thumb she had a vesicle that was nontender to touch.  No sign of synovitis appreciated.  PERIPHERAL VASCULAR:  No edema, erythema, or increased warmth appreciated.  SKIN:  Grossly unremarkable.    IMPRESSION REPORT PLAN:  This is a pleasant 49 year old woman with clinical findings consistent with bilateral knee osteoarthritis, left worse than right.  Her pain has improved considerably with exercise and occasional medication.  She also found that the injection also was beneficial.  At this point it is too soon to repeat the injection.  The patient is doing fairly well.  Therefore, injection can be postponed.  I encouraged that she continues daily exercise in her effort to lose weight as this will improve her overall cardiopulmonary health as well as joint pain.    I also encouraged that she continues the use of vitamin D supplement.  The patient was given a prescription refill for acetaminophen  and ibuprofen to use only as needed.  Certainly, if she found that she needs to use medication more frequently she was asked to return for a followup visit to be reassessed.    I did make arrangements for her to return for a followup visit in approximately 2 months to reassess at her request.  Should pain worsen, a repeat of the injection could be considered at that time.    Regarding her thumb pain, I asked that she discuss this further with Dr. Gavin Pound as this  appears to be mostly a vesicle within the subcutaneous layer.    ___________________________  Reviewed and Electronically Signed By: Deri Fuelling MD  Sig Date: 07/12/2014  Sig Time: 17:16:29  Dictated By: Deri Fuelling MD  Dict Date: 07/12/2014 Dict Time: 11 29 AM    Dictation Date and Time:07/12/2014 11:29:04  Transcription Date and Time:07/12/2014 12:21:56  eScription Dictation id: [id number] Confirmation # :[id number]      cc: Gavin Pound MD

## 2014-07-14 ENCOUNTER — Encounter (HOSPITAL_BASED_OUTPATIENT_CLINIC_OR_DEPARTMENT_OTHER): Payer: Self-pay | Admitting: Internal Medicine

## 2014-07-14 DIAGNOSIS — M79646 Pain in unspecified finger(s): Secondary | ICD-10-CM | POA: Insufficient documentation

## 2014-08-05 ENCOUNTER — Telehealth (HOSPITAL_BASED_OUTPATIENT_CLINIC_OR_DEPARTMENT_OTHER): Payer: Self-pay | Admitting: Ambulatory Care

## 2014-08-05 NOTE — Telephone Encounter (Signed)
-----   Message from Elyn Aquas sent at 08/05/2014 11:00 AM EDT -----  Regarding: (L) hand blister   Contact: [phone]  Amanda Cervantes is a 49 year old old female.    Patient's PCP: Gavin Pound, MD    In case we get disconnected what is the best number to reach you at today:  Mobile Phone Telephone Information:  Mobile          [phone]      Person calling:  Patient (self)    How can I help you today:   Other  This pt called regarding a (L) hand blister and she would like to speak to a nurse    Patient's language of care: [place]    Would you like an interpreter when the nurse calls you back?  YES  [place]

## 2014-08-05 NOTE — Progress Notes (Signed)
Blister on her thumb for over a month . Does no remember injury . Has not gone away .   Wants to see provider to evaluate .   Appointment made with Dr Patrice Paradise for tomorrow .

## 2014-08-06 ENCOUNTER — Encounter (HOSPITAL_BASED_OUTPATIENT_CLINIC_OR_DEPARTMENT_OTHER): Payer: Self-pay | Admitting: Internal Medicine

## 2014-08-06 ENCOUNTER — Ambulatory Visit (HOSPITAL_BASED_OUTPATIENT_CLINIC_OR_DEPARTMENT_OTHER): Payer: PRIVATE HEALTH INSURANCE | Admitting: Internal Medicine

## 2014-08-06 VITALS — BP 154/92 | HR 59 | Temp 98.2°F | Wt 200.0 lb

## 2014-08-06 DIAGNOSIS — M25842 Other specified joint disorders, left hand: Principal | ICD-10-CM

## 2014-08-06 DIAGNOSIS — R7303 Prediabetes: Secondary | ICD-10-CM

## 2014-08-06 DIAGNOSIS — E669 Obesity, unspecified: Secondary | ICD-10-CM

## 2014-08-06 DIAGNOSIS — I10 Essential (primary) hypertension: Secondary | ICD-10-CM

## 2014-08-06 MED ORDER — LISINOPRIL 40 MG PO TABS
40.0000 mg | ORAL_TABLET | Freq: Every day | ORAL | 6 refills | Status: DC
Start: 2014-08-06 — End: 2016-01-09

## 2014-08-06 NOTE — Progress Notes (Signed)
HTN/preDM vs DM:  See EPIC Problem List for HPI.  She reports taking the hypertension medications as on her EPIC med list below.  She reports no difficulty with compliance or side-effects  She denies chest pain, dyspnea, headache, lower extremity edema or localized weakness or paresthesias  Her last three blood pressures in clinic were:  Most Recent BP Reading(s)  08/06/14 : (!) 154/92  06/11/14 : 138/88  05/06/14 : (!) 155/99    Her Most Recent Weight Reading(s)  08/06/14 : 90.7 kg (200 lb)  06/11/14 : 93 kg (205 lb)  05/06/14 : 95.7 kg (211 lb)  05/04/14 : 95.7 kg (211 lb)      last potassium was   POTASSIUM (mmol/L)   Date Value   05/04/2014 4.0   ----------  last creatinine was   CREATININE (mg/dL)   Date Value   05/04/2014 0.9   ----------    Thumb cyst  Over the last week or two she's noted a cyst on her left thumb  She tried to pop it but it didn't work  No redness or heat  No injuries   Has been enlarging in size    Social History Narrative    she is originally from [place] ,[place].  She came to the [place] in 2005     In [place], she worked as a Musician.     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 No new headaches. No shortness of breath or chest pain.      BP (!) 154/92  Pulse 59  Temp 98.2 F (36.8 C) (Oral)  Wt 90.7 kg (200 lb)  LMP 07/19/2014 (Approximate)  SpO2 99%  BMI 34.15 kg/m2   Heart: S1 and S2 normal, no murmurs, clicks, gallops or rubs. Regular rate and rhythm.   Lungs:  clear; no wheezes, rhonchi or rales.  Thumb: 2cm subcutaneous cystic structure that is mobile & smooth, seems to be full of fluid on volar aspect of left thumb over IP joint      ASSESSMENT & PLAN:  (M25.842) Cyst of joint of hand, left  (primary encounter diagnosis)  Comment: this seems to be some  sort of cyst forming from her joint -- no evidence of infection -- no suspicion for cancer; will refer to ortho to drain or otherwise treat  Plan: REFERRAL TO ORTHOPEDICS ( INT)           (E66.9) Obesity (BMI 30-39.9)  Comment: She is congratulated on an excellent job with lifestyle changes. Discussed the natural hx with her healthy lifestyle changes moving forward.      (R73.09) Pre-diabetes --???DM  Comment: focused on lifestyle changes in detail      (I10) Essential hypertension, benign  Comment: continues to have elevated readings, even with her weight loss, so I think best to increase her lisinopril from 30 mg daily to 40 mg daily        The patient was ready to learn and no apparent learning or adherence barriers were identified. I explained the diagnosis and treatment plan, and the patient expressed understanding of the content. I attempted to answer any questions regarding the diagnosis and the proposed treatment.    Possible side  effects of the prescribed medication was explained. We discussed the patients current medications.  We discussed the importance of medication compliance. The patient expressed understanding and no barriers to adherence were identified.    follow-up for Pap & BP check in 3 weeks  she has been advised to call or return with any worsening or new problems

## 2014-08-24 ENCOUNTER — Ambulatory Visit (HOSPITAL_BASED_OUTPATIENT_CLINIC_OR_DEPARTMENT_OTHER): Payer: PRIVATE HEALTH INSURANCE | Admitting: Internal Medicine

## 2014-08-27 ENCOUNTER — Ambulatory Visit (HOSPITAL_BASED_OUTPATIENT_CLINIC_OR_DEPARTMENT_OTHER): Payer: PRIVATE HEALTH INSURANCE | Admitting: Internal Medicine

## 2014-08-27 ENCOUNTER — Encounter (HOSPITAL_BASED_OUTPATIENT_CLINIC_OR_DEPARTMENT_OTHER): Payer: Self-pay | Admitting: Internal Medicine

## 2014-08-27 VITALS — BP 162/102 | HR 68 | Temp 98.1°F | Wt 202.0 lb

## 2014-08-27 DIAGNOSIS — R7303 Prediabetes: Secondary | ICD-10-CM

## 2014-08-27 DIAGNOSIS — I10 Essential (primary) hypertension: Principal | ICD-10-CM

## 2014-08-27 DIAGNOSIS — F17200 Nicotine dependence, unspecified, uncomplicated: Secondary | ICD-10-CM

## 2014-08-27 DIAGNOSIS — E669 Obesity, unspecified: Secondary | ICD-10-CM

## 2014-08-27 MED ORDER — CHLORTHALIDONE 25 MG PO TABS
25.0000 mg | ORAL_TABLET | Freq: Every day | ORAL | 4 refills | Status: DC
Start: 2014-08-27 — End: 2015-08-31

## 2014-08-27 NOTE — Progress Notes (Addendum)
HTN/obesity/pre-DM:  See EPIC Problem List for HPI.  She reports taking the hypertension medications as on her EPIC med list below.  She reports no difficulty with compliance or side-effects  She denies chest pain, dyspnea, headache, lower extremity edema or localized weakness or paresthesias  Her last three blood pressures in clinic were:  Most Recent BP Reading(s)  08/27/14 : (!) 162/102  08/06/14 : (!) 154/92  06/11/14 : 138/88    Her Most Recent Weight Reading(s)  08/27/14 : 91.6 kg (202 lb)  08/06/14 : 90.7 kg (200 lb)  06/11/14 : 93 kg (205 lb)  05/06/14 : 95.7 kg (211 lb)      last potassium was   POTASSIUM (mmol/L)   Date Value   05/04/2014 4.0   ----------  last creatinine was   CREATININE (mg/dL)   Date Value   05/04/2014 0.9   ----------    Social History Narrative    she is originally from [place] ,[place].  She came to the [place] in 2005     In [place], she worked as a Musician.     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 No new headaches. No shortness of breath or chest pain.     BP (!) 162/102  Pulse 68  Temp 98.1 F (36.7 C) (Oral)  Wt 91.6 kg (202 lb)  LMP 07/19/2014 (Approximate)  BMI 34.49 kg/m2  Heart: S1 and S2 normal, no murmurs, clicks, gallops or rubs. Regular rate and rhythm.   Lungs:  clear; no wheezes, rhonchi or rales.      ASSESSMENT & PLAN:  (I10) Essential hypertension, benign  (primary encounter diagnosis)  Comment: she had previously been on chlorthalidone with good control, but had one low K+ so we had changed to lisinopril - -now that she's on lisinopril will less likely have a problem with K+ but will follow closely  Gave 'How to take medication' patient education hand-out in [place] which reviews common and serious side-effects, including low K+.   She expresses understanding of how to take medicine & its side-effects.    She will have RN BP check in 2 wks as well as repeat blood work at that time  Plan: chlorthalidone (HYGROTEN) 25 MG tablet, BUN         (UREA NITROGEN), CREATININE, POTASSIUM,         COLLECTION VENOUS BLOOD VENIPUNCTURE            (E66.9) Obesity (BMI 30-39.9)  Comment: discussed basic principles of dieting today including:  - minimizing eating fried foods  - keeping portion sizes small, especially in respect to pasta, breads & desserts  - eating a lot of vegetables while cooking these in only a little bit of oil  - getting regular exercise, ideally 30 minutes on most days of the week  - reviewed that meds are not recommended as they often have side effects and she will gain the weight again after stopping them       (R73.09) Pre-diabetes --???DM  Comment: focus on healthy lifestyle changes as above      (F17.200) Tobacco dependence  Comment: counseled on the importance of complete abstence from tobacco because even one cigarette creates  a high risk for relapse  Provided supportive counseling.          The patient was ready to learn and no apparent learning or adherence barriers were identified. I explained the diagnosis and treatment plan, and the patient expressed understanding of the content. I attempted to answer any questions regarding the diagnosis and the proposed treatment.    Possible side effects of the prescribed medication was explained. We discussed the patients current medications.  We discussed the importance of medication compliance. The patient expressed understanding and no barriers to adherence were identified.    follow-up will be scheduled for 2 weeks from now  she has been advised to call or return with any worsening or new problems     face-to-face 25 minutes with > 50% of the visit counseling regarding above issues

## 2014-09-14 ENCOUNTER — Ambulatory Visit (HOSPITAL_BASED_OUTPATIENT_CLINIC_OR_DEPARTMENT_OTHER): Payer: PRIVATE HEALTH INSURANCE | Admitting: Internal Medicine

## 2014-09-14 ENCOUNTER — Encounter (HOSPITAL_BASED_OUTPATIENT_CLINIC_OR_DEPARTMENT_OTHER): Payer: Self-pay | Admitting: Internal Medicine

## 2014-09-14 VITALS — BP 120/82 | HR 69 | Temp 98.8°F | Wt 196.2 lb

## 2014-09-14 DIAGNOSIS — Z01419 Encounter for gynecological examination (general) (routine) without abnormal findings: Principal | ICD-10-CM

## 2014-09-14 NOTE — Progress Notes (Signed)
SUBJECTIVE:    Amanda Cervantes is a 49 year old woman here for GYN     Obstetric History    No data available       CC/HPI:     GYN History:    pre menopausal  Menses: regular every month, lasting 5-6 days.  Flow: average/moderate    Intermenstrual bleeding: none  Dysmenorrhea: mild    Sexual History: sexually active, monogamous and female partner, tubal ligation     Past GYN History: no change since last visit    Significant OB History: no change since last visit      Current Outpatient Prescriptions:  chlorthalidone (HYGROTEN) 25 MG tablet Take 1 tablet by mouth daily Disp: 90 tablet Rfl: 4   lisinopril (PRINIVIL,ZESTRIL) 40 MG tablet Take 1 tablet by mouth daily Disp: 90 tablet Rfl: 6   acetaminophen (TYLENOL) 500 MG tablet Take 2 tablets by mouth 3 (three) times daily as needed for Pain. Disp: 160 tablet Rfl: 1   ibuprofen (ADVIL,MOTRIN) 800 MG tablet Take 1 tablet by mouth every 8 (eight) hours as needed for Pain (with food). Disp: 160 tablet Rfl: 1   ergocalciferol (VITAMIN D2) 50000 UNIT capsule Take 1 capsule by mouth once a week. Disp: 4 capsule Rfl: 2     No current facility-administered medications for this visit.     Penicillins    Patient Active Problem List:     Obesity (BMI 30-39.9)     Essential hypertension, benign     Tobacco dependence     Pre-diabetes --???DM     Left knee pain     Left leg pain     Thumb pain        Past Surgical History    TUBAL LIGATION           Social History   Marital status: Legally Separated  Spouse name: N/A    Years of education: N/A  Number of children: N/A     Occupational History  None on file     Social History Main Topics   Smoking status: Former Smoker  0.33 Packs/day  For 10.00 Years     Types: Cigarettes    Quit date: 10/15/2012    Smokeless tobacco: Not on file    Comment: quit 3 months ago on 10/15/12    Alcohol use Yes    Comment: minimal    Drug use: No    Sexual activity: Yes    Partners: Male    Birth control/ protection: Tubal Ligation     Other Topics Concern    None on file     Social History Narrative    she is originally from [place] ,[place].  She came to the [place] in 2005     In [place], she worked as a Musician.     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    She lives with boyfriend & daughter; no DV;      she has 2 children, age 45 y/o son in [place] & 49 y/o daughter lives with her               Family History    Heart Disease Father     Comment: died age 55 from MI; learned about HTN in 70's    Cancer - Other Mother     Comment: stomach cancer, died    GI Sister     Comment: hernia    Thyroid Sister  ROS:  Genitourinary: negative    PHYSICAL EXAM:  Breasts: no masses, skin, nipple or axillary changes and nipples everted  Abdomen: no masses or tenderness and soft, non-tender    PELVIC:  External Genitalia: normal architecture, no lesions and no discharge  Vagina: well rugated and no lesions  Vaginal Discharge: normal appearing, moderate and clear  Pelvic supports: normal  Cervix: no lesions and no cervical motion tenderness  Uterus: anteverted, normal size and non-tender  Adnexa: no masses, nodularity, tenderness  Rectum: deferred      ASSESSMENT & PLAN:  (Z01.419) Encounter for gynecological examination without abnormal finding  (primary encounter diagnosis)  Comment: Normal gyn exam  Will f/u with results   Plan: CYTP CERV/VAG AUTO THIN LAYER PREP MNL SCREEN,         HUMAN PAPILLOMAVIRUS, OBTAINING SCREEN PAP         SMEAR, [place] MAMMOGRAPHY SCREENING BILATERAL W CAD

## 2014-09-16 ENCOUNTER — Ambulatory Visit (HOSPITAL_BASED_OUTPATIENT_CLINIC_OR_DEPARTMENT_OTHER): Payer: PRIVATE HEALTH INSURANCE | Admitting: Hand Surgery

## 2014-09-16 LAB — HUMAN PAPILLOMAVIRUS (HPV): HUMAN PAPILLOMAVIRUS: NEGATIVE

## 2014-09-17 ENCOUNTER — Ambulatory Visit (HOSPITAL_BASED_OUTPATIENT_CLINIC_OR_DEPARTMENT_OTHER): Payer: PRIVATE HEALTH INSURANCE | Admitting: Internal Medicine

## 2014-09-17 ENCOUNTER — Ambulatory Visit (HOSPITAL_BASED_OUTPATIENT_CLINIC_OR_DEPARTMENT_OTHER): Payer: PRIVATE HEALTH INSURANCE | Admitting: Hand Surgery

## 2014-09-22 ENCOUNTER — Encounter (HOSPITAL_BASED_OUTPATIENT_CLINIC_OR_DEPARTMENT_OTHER): Payer: Self-pay | Admitting: Internal Medicine

## 2014-09-22 ENCOUNTER — Ambulatory Visit (HOSPITAL_BASED_OUTPATIENT_CLINIC_OR_DEPARTMENT_OTHER): Payer: PRIVATE HEALTH INSURANCE | Admitting: Internal Medicine

## 2014-09-22 VITALS — BP 120/70 | HR 72 | Temp 98.1°F | Wt 200.0 lb

## 2014-09-22 DIAGNOSIS — I10 Essential (primary) hypertension: Principal | ICD-10-CM

## 2014-09-22 DIAGNOSIS — R7303 Prediabetes: Secondary | ICD-10-CM

## 2014-09-22 DIAGNOSIS — E669 Obesity, unspecified: Secondary | ICD-10-CM

## 2014-09-22 DIAGNOSIS — F17200 Nicotine dependence, unspecified, uncomplicated: Secondary | ICD-10-CM

## 2014-09-22 LAB — CREATININE: CREATININE: 0.7 mg/dL (ref 0.4–1.2)

## 2014-09-22 LAB — POTASSIUM: POTASSIUM: 3.6 mmol/L (ref 3.5–5.1)

## 2014-09-22 LAB — BUN (UREA NITROGEN): BUN (UREA NITROGEN): 14 mg/dL (ref 7–18)

## 2014-09-22 NOTE — Progress Notes (Signed)
follow up multiple issues    HTN/pre-DM/obesity:  See EPIC Problem List for HPI.  She reports taking the hypertension medications as on her EPIC med list below.  She reports no difficulty with compliance or side-effects  She denies chest pain, dyspnea, headache, lower extremity edema or localized weakness or paresthesias  Her last three blood pressures in clinic were:  Most Recent BP Reading(s)  09/22/14 : 120/70  09/14/14 : 120/82  08/27/14 : (!) 162/102    Her Most Recent Weight Reading(s)  09/22/14 : 90.7 kg (200 lb)  09/14/14 : 89 kg (196 lb 3.2 oz)  08/27/14 : 91.6 kg (202 lb)  08/06/14 : 90.7 kg (200 lb)      last potassium was   POTASSIUM (mmol/L)   Date Value   05/04/2014 4.0   ----------  last creatinine was   CREATININE (mg/dL)   Date Value   05/04/2014 0.9   ----------     ROS: No fevers or unexplained weight loss. No new headaches. No shortness of breath or chest pain.     Social History Narrative    she is originally from [place] ,[place].  She came to the [place] in 2005     In [place], she worked as a Musician.     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 Regular rate and rhythm.   Lungs:  clear; no wheezes, rhonchi or rales.  MMSE: well groomed, affect normal, no tangential thoughts, no pressured speech, no agitation or psychomotor slowing, normal response-time to questions  Neuro: alert & oriented x 3, no dysarthria or aphasia, gait wnl     ASSESSMENT & PLAN:  (I10) Essential hypertension, benign  Comment: well controlled continue current regimen -- will check  Plan: BUN (UREA NITROGEN), CREATININE, POTASSIUM,         COLLECTION VENOUS BLOOD  VENIPUNCTURE            (E66.9) Obesity (BMI 30-39.9)  Comment: gaining weight again, reviewed in detail  discussed basic principles of dieting today including:  - minimizing eating fried foods  - keeping portion sizes small, especially in respect to pasta, breads & desserts  - eating a lot of vegetables while cooking these in only a little bit of oil  - getting regular exercise, ideally 30 minutes on most days of the week  - reviewed that meds are not recommended as they often have side effects and she will gain the weight again after stopping them       (R73.09) Pre-diabetes --???DM  Comment: she is very borderline to have DM, too early to repeat HgA1c but it's very key that she address the lifestyle changes ASAP & she agrees      (F17.200) Tobacco dependence  Comment: not currently smoking        The patient was ready to learn and no apparent learning or adherence barriers were identified. I explained the diagnosis and treatment plan, and the patient expressed understanding of the content. I attempted to answer any  questions regarding the diagnosis and the proposed treatment.    Possible side effects of the prescribed medication was explained. We discussed the patients current medications.  We discussed the importance of medication compliance. The patient expressed understanding and no barriers to adherence were identified.    follow-up will be scheduled for 6 months from now  she has been advised to call or return with any worsening or new problems

## 2014-09-27 ENCOUNTER — Encounter (HOSPITAL_BASED_OUTPATIENT_CLINIC_OR_DEPARTMENT_OTHER): Payer: Self-pay | Admitting: Internal Medicine

## 2014-09-27 LAB — CYTOPATH, C/V, THIN LAYER

## 2014-10-27 ENCOUNTER — Other Ambulatory Visit (HOSPITAL_BASED_OUTPATIENT_CLINIC_OR_DEPARTMENT_OTHER): Payer: Self-pay | Admitting: Licensed Practical Nurse

## 2014-10-27 DIAGNOSIS — M79645 Pain in left finger(s): Principal | ICD-10-CM

## 2014-10-28 ENCOUNTER — Ambulatory Visit (HOSPITAL_BASED_OUTPATIENT_CLINIC_OR_DEPARTMENT_OTHER): Payer: PRIVATE HEALTH INSURANCE | Admitting: Hand Surgery

## 2014-11-09 ENCOUNTER — Ambulatory Visit: Payer: Self-pay | Admitting: Internal Medicine

## 2014-12-03 ENCOUNTER — Other Ambulatory Visit (HOSPITAL_BASED_OUTPATIENT_CLINIC_OR_DEPARTMENT_OTHER): Payer: Self-pay | Admitting: Registered Nurse

## 2014-12-03 DIAGNOSIS — M79642 Pain in left hand: Principal | ICD-10-CM

## 2014-12-07 ENCOUNTER — Ambulatory Visit (HOSPITAL_BASED_OUTPATIENT_CLINIC_OR_DEPARTMENT_OTHER): Payer: Medicaid Other | Admitting: Hand Surgery

## 2014-12-07 VITALS — BP 102/66 | HR 77 | Temp 97.7°F | Resp 20

## 2014-12-07 DIAGNOSIS — M79645 Pain in left finger(s): Secondary | ICD-10-CM

## 2014-12-07 DIAGNOSIS — M1712 Unilateral primary osteoarthritis, left knee: Principal | ICD-10-CM

## 2014-12-07 DIAGNOSIS — M79642 Pain in left hand: Secondary | ICD-10-CM

## 2014-12-07 LAB — XR FINGER(S) LEFT MINIMUM 2 VIEWS

## 2014-12-07 MED ORDER — TRIAMCINOLONE ACETONIDE 40 MG/ML IJ SUSP
40.00 mg | Freq: Once | INTRAMUSCULAR | 0 refills | Status: AC
Start: 2014-12-07 — End: 2014-12-07

## 2014-12-07 MED ORDER — LIDOCAINE HCL 1 % IJ SOLN
4.00 mL | Freq: Once | INTRAMUSCULAR | 0 refills | Status: AC
Start: 2014-12-07 — End: 2014-12-07

## 2014-12-07 NOTE — Progress Notes (Signed)
New patient to Dr. Joylene John, [facility] patient    Interpreter: A [place] interpreter was used today via telephone.  This patient was seen in consultation at the request of her PCP  PCP: Gavin Pound, MD  Hand Dominance: Right    DX: Left thumb IP digital mucous cyst, left knee OA    HPI:   -49 y/o RHD F presenting today at the request of her PCP for evaluation of a cyst-like mass on her left thumb  -She noticed a bump at the IP joint 3-4 months ago  -The bump increased in size and was intermittently painful  -She began taking medication for her left knee, ibuprofen and then noticed the bump on her thumb began to go away, she can't really see it anymore  -also c/o left knee pain today, requesting a cortisone injection  -see Dr. Ena Dawley for knee OA, had an injection in February 2016, reports months of pain relief, pain recently returned  -patient was referred to Dr. Joylene John today for evaluation and consultation of left thumb mass  -she otherwise feels well    Occupation: House cleaning    Review of Systems:   Gen- Negative for fevers, chills, recent illnesses, night sweats, weight loss.  Skin- Negative for rashes, hair loss  CV- Negative for chest pain, palpatations.  Pulm- Negative for SOB, wheezing  GI- Negative for abdominal pain, N/V, diarrhea, bloody stool, ulcers, constipation.  GU- Negative for hematuria, dysuria, frequency or feeling of incomplete empyting.  Hematologic - Negative for easy bleeding/bruising, pallor and lymphadenopathy  MSK- See HPI for pertinent positives.  Psych- Negative for depression, anxiety, history of dependency.  Neuro - Negative for seizures, headache, weakness, dizziness, tremor, confusion    Allergies and medications: Reviewed and updated in Epic.    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    Surgical Hx:   Past Surgical History    TUBAL LIGATION         Family Hx:   Family History    Heart Disease Father     Comment: died age 50 from MI; learned about HTN in 80's    Cancer - Other Mother     Comment: stomach cancer, died    GI Sister     Comment: hernia    Thyroid Sister        Social Hx:   Social History   Marital status: Legally Separated  Spouse name: N/A    Years of education: N/A  Number of children: N/A     Occupational History  None on file     Social History Main Topics   Smoking status: Former Smoker  0.33 Packs/day  For 10.00 Years     Types: Cigarettes    Quit date: 10/15/2012    Smokeless tobacco: Not on file  Comment: quit 3 months ago on 10/15/12    Alcohol use Yes    Comment: minimal    Drug use: No    Sexual activity: Yes    Partners: Male    Birth control/ protection: Tubal Ligation     Other Topics Concern   None on file     Social History Narrative    she is originally from [place] ,[place].  She came to the [place] in 2005     In [place], she worked as a Musician.     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    She lives with boyfriend & daughter; no DV;      she has 2 children, age 49 y/o son in [place] & 6 y/o daughter lives with her       Physical exam:   12/07/14  1127   BP: 102/66   Pulse: 77   Resp: 20   Temp: 97.7 F (36.5 C)   SpO2: 100%       General/neuro: Alert and oriented x 3, no acute distress, cooperative  HEENT: PERRLA, EOM intact  Neck: Supple, no noticeable or palpable swelling, redness or rash around throat or on face  Lymph: no LAD  Cardiac: RRR  Pulm: CTAB, breathing comfortably on room air  Abdominal: Soft, NT,  bowel sounds in all 4 quadrants  Skin: warm and dry without rashes, lesions or abrasions  Musculoskeletal:   -Left thumb is without swelling, deformity, or obvious masses  -There is a very slight, minimally to non tender soft tissue prominence along the radial aspect of the left thumb IP joint, this prominence is barely visible, pt states that she used to have a very large bump here  -No pain over the [facility] joint, negative grind  -ROM in left thumb and hand is normal and symmetric  -Median, radial and ulnar nerve is intact, both motor and sensory.  -Brisk capillary refill.   -Radial pulses 2+.  -Left knee with full AROM, no obvious instability or laxity, no effusion, + mild crepitus, no tenderness to palpation along the medial and lateral joint line. Reports mild discomfort with ROM today.   -LLE is NVI    Knee Injection: Using aseptic technique, 40mg  of Kenalog mixed with 1% plain lidocaine (4:1) was injected in to the left knee joint via anterolateral approach. The injection was well tolerated and a band aid was placed.   -Patient verbally consented to a cortisone injection today which was well tolerated   -Patient understands that the area around the injection will be numb for the next few hours and that cortisone can take 1 week to become effective  -Recommended to take NSAIDS or Tylenol and apply ice for 10-15 minutes tonight and tomorrow for post-injection discomfort     Imaging: X-rays of the left hand taken today show diffuse degenerative arthritic changes to DIP and IP joints. Mild joint space narrowing, marginal osteophytes. There is a small dislodged osteophyte adjacent to the left thumb IP joint, radially, where pt had a mass. Radiographs were visualized and interpreted by both myself and Dr. Joylene John.    Assessment/plan: This is a 49 y/o [place] woman with a left thumb IP joint mucous cyst which has recently resolved, it is no longer visible. She also has pain from left knee OA. The diagnosis, various  treatment options and prognosis were reviewed with the patient who verbalized understanding. I educated her about osteoarthritis and digital mucous  cysts. She is no longer having pain in the thumb and agrees to wait and watch the area for recurrence. If the cyst recurs and becomes bothersome, painful and limits her ROM and she wishes to undergo excision, she will call the office to be seen. We discussed various management options for OA.  She has a known diagnosis of bilateral knee OA s/p cortisone injection x 1 by Dr. Ena Dawley in February 2016. She reported months of relief; however, the pain recently returned and she is requesting another injection. A left knee injection was performed today and follow up was arranged with Dr. Ena Dawley.   30 minutes was spent today, face to face with patient in consultation and coordination of care.   Follow up with Dr. Joylene John as needed.  Patient was seen, discussed and examined with Dr. Joylene John who formulated the assessment and plan. Please see her dictated note as well.  Rip Harbour, PA-C, 12/07/2014, 11:34 AM

## 2014-12-07 NOTE — Progress Notes (Signed)
Left Thumb IP joint cyst resolved, OA of left knee, requesting injection, injection given to left knee.  Please see the full note of Amanda Cervantes, [place].  I, Dr. Fredderick Severance, have seen and examined this patient, reviewed any pertinent studies and images,  and formulated the plan of care.    Caral Whan A. Joylene John, MD, 12/07/2014, 1:27 PM

## 2015-01-20 ENCOUNTER — Ambulatory Visit (HOSPITAL_BASED_OUTPATIENT_CLINIC_OR_DEPARTMENT_OTHER): Payer: Medicaid Other | Admitting: Physical Medicine & Rehabilitation

## 2015-02-28 ENCOUNTER — Ambulatory Visit (HOSPITAL_BASED_OUTPATIENT_CLINIC_OR_DEPARTMENT_OTHER): Payer: Medicaid Other | Admitting: Physical Medicine & Rehabilitation

## 2015-03-04 ENCOUNTER — Ambulatory Visit (HOSPITAL_BASED_OUTPATIENT_CLINIC_OR_DEPARTMENT_OTHER): Payer: Medicaid Other | Admitting: Physical Medicine & Rehabilitation

## 2015-03-04 ENCOUNTER — Encounter (HOSPITAL_BASED_OUTPATIENT_CLINIC_OR_DEPARTMENT_OTHER): Payer: Self-pay | Admitting: Physical Medicine & Rehabilitation

## 2015-03-04 ENCOUNTER — Ambulatory Visit (HOSPITAL_BASED_OUTPATIENT_CLINIC_OR_DEPARTMENT_OTHER): Payer: Medicaid Other | Admitting: Hand Surgery

## 2015-03-04 DIAGNOSIS — M7072 Other bursitis of hip, left hip: Principal | ICD-10-CM

## 2015-03-04 DIAGNOSIS — E669 Obesity, unspecified: Secondary | ICD-10-CM

## 2015-03-04 DIAGNOSIS — M1712 Unilateral primary osteoarthritis, left knee: Secondary | ICD-10-CM

## 2015-03-04 MED ORDER — IBUPROFEN 800 MG PO TABS
800.0000 mg | ORAL_TABLET | Freq: Three times a day (TID) | ORAL | 1 refills | Status: AC | PRN
Start: 2015-03-04 — End: 2015-06-02

## 2015-03-04 MED ORDER — LIDOCAINE HCL 1 % IJ SOLN
5.00 mL | Freq: Once | INTRAMUSCULAR | 0 refills | Status: AC
Start: 2015-03-04 — End: 2015-03-04

## 2015-03-04 MED ORDER — ACETAMINOPHEN 500 MG PO TABS
1000.0000 mg | ORAL_TABLET | Freq: Three times a day (TID) | ORAL | 1 refills | Status: AC | PRN
Start: 2015-03-04 — End: 2015-06-03

## 2015-03-04 MED ORDER — TRIAMCINOLONE ACETONIDE 40 MG/ML IJ SUSP
40.00 mg | Freq: Once | INTRAMUSCULAR | 0 refills | Status: AC
Start: 2015-03-04 — End: 2015-03-04

## 2015-03-04 NOTE — Progress Notes (Signed)
The primary progress note for this visit has been dictated through E-Scription. It can be viewed as an attachment to this encounter or through Chart Review under the Notes Tab as an Orthopedic Office Note.      Review of Systems: Constitutional, Eyes, ENT/Mouth, Cardiovascular, Respiratory, GI, GU, Neuro, Psych, Heme/Lymph, Skin, Musculoskeletal was reviewed and is NEGATIVE except for what is dictated in the note.    MT ACCT #:  [id number]  PATIENT/PROCEDURE VERIFICATION DOCUMENTATION    Correct patient: Yes  Correct procedure: Yes  Correct site, mark visible if applicable: Yes    Pre-procedure Vital Signs:  BP: N/A  P: N/A  R: N/A    Risks and Benefits reviewed: Yes  Side: Left  Correct position: Yes  Special equipment/implant(s) present, if applicable: Yes    Post-procedure Vitals Signs:  BP: N/A  P: N/A  R: N/A    Time-out completed, documented by provider doing procedure or designated team member:  Shep Porter A. Ena Dawley, MD    03/04/2015    1:45 PM

## 2015-03-04 NOTE — Progress Notes (Signed)
Date of Service: 03/04/2015    HISTORY OF PRESENT ILLNESS:  This is a pleasant 49 year old woman who returns today for a followup visit.  I last saw her on July 12, 2014.  Please refer to my previous note for more details.  She returns today with complaints of left leg pain.  She describes pain along the lateral aspect of her thigh and leg and dorsal aspect of the foot.  She complains of pain along both knees, which is more noticeable with going up and down stairs, prolonged sitting, and walking.  She describes difficulty lying on the left side, particularly at night, as she has worsening of her left leg pain.  She noted improvement of her pain following the knee injection.  She also notes some alleviation of pain with the use of medication, namely ibuprofen and acetaminophen.    She has not participated in physical therapy recently.    She denies fever, recurrent cough or pneumonia, or significant weight change in the last few months, bowel or bladder incontinence, or skin changes.    Previous imaging of the knee consists of x-rays in February 2016, which showed findings consistent with mild to moderate left osteoarthritis.  No other pertinent imaging study available at this time.    SOCIAL HISTORY:  She is married, works as a Secretary/administrator, denies alcohol or illicit drug use.    PHYSICAL EXAMINATION:  GENERAL:  This is a pleasant, overweight 49 year old woman who is alert, oriented to time, person, and place with appropriate mood and affect.  MENTAL STATUS:  Oriented to person, place and time.  Displays appropriate mood and affect.  GAIT:  Normal.  STRENGTH TESTING:  There is normal strength in the upper and lower limbs without tone abnormality or atrophy.  SENSATION:  No loss of sensation is noted.  NEUROLOGIC:  Bilateral upper and lower extremity coordination and muscle stretch reflexes are physiologic and symmetric.  Plantar responses are downgoing.  SPINE RANGE OF MOTION:  Normal range of motion with no pain  reproduction.STRAIGHT-LEG RAISING AND FORAMEN COMPRESSION MANEUVERS:  Straight-leg raising in the sitting and supine positions is negative to radicular pain.  PERIPHERAL JOINT RANGE OF MOTION:  There is full active range of motion throughout without any obvious instability or laxity.  She does have crepitance involving both knees with tenderness in the medial joint line, left worse than right.  No joint instability appreciated.  Valgus and varus stress test, Lachman's, and McMurray's were negative.  PROVOCATIVE MANEUVERS:  Shoulder, hip, sacroiliac, and knee provocative maneuvers are negative.  PALPATION:  Revealed no gross appendicular defect.  Palpatory examination revealed exquisite tenderness along the left greater trochanteric bursa region.  PERIPHERAL VASCULAR:  No edema, erythema, or increased warmth appreciated. APPEARANCE OF SKIN:  Grossly unremarkable.    IMPRESSION REPORT PLAN:  This is a pleasant 49 year old woman with clinical findings consistent with bilateral knee osteoarthritis, left worse than right, left greater trochanteric bursa region pain.  There was no convincing sign of nerve impingement at the lumbar spine on physical examination today.  I discussed with her consideration of injection along the left greater trochanteric bursa region and left knee for symptomatic relief of pain.  She agreed to do so.  She may continue medication as previously prescribed namely acetaminophen and ibuprofen.  I do advise that she continues her effort to lose weight, as this will improve her overall cardiopulmonary health as well as back and joint pain.  A referral for physical therapy was recommended.  However,  at this point, she declined.    I will see her in followup in approximately 6-8 weeks.  Certainly, if she has questions prior to that, she will contact me by phone for an earlier appointment.    PROCEDURE:  Left greater trochanteric bursa region injection, left knee steroid injection.  INDICATION:  Left  greater trochanteric bursa region pain, left knee osteoarthritis.    The risks and benefit as well as alternative treatment were discussed with the patient.  They included but not necessarily limited to infection, allergic reaction, local tissue breakdown, nerve injury, paresis, and possible inefficacy of the treatment.  She expressed full understanding and agreed to proceed.    She was placed in a seated position.  Sterile technique was observed throughout the procedure.  The skin was prepped with ChloraPrep x3.  Using a 22-gauge 1-1/2 inch needle, a total of 4 mL of 1% lidocaine and 40 mg of Kenalog were injected along the anterolateral aspect of the left knee.  She tolerated the procedure well without any complication.    She was then placed in the right lateral decubitus position with the left hip flexed.  Sterile technique was observed throughout the procedure.  The point of maximal tenderness was identified.  Using a 22-gauge 1-1/2 inch needle, the needle was approached until os was appreciated.  At this point, a total of 5 mL of 1% lidocaine and 20 mg Kenalog were injected.  She tolerated the procedure well without any complication and was kept for observation for an appropriate period of time before being discharged home in good condition and upon her own volition.    ___________________________  Reviewed and Electronically Signed By: Deri Fuelling MD  Sig Date: 03/15/2015  Sig Time: 21:15:33  Dictated By: Deri Fuelling MD  Dict Date: 03/04/2015 Dict Time: 02 27 PM    Dictation Date and Time:03/04/2015 14:27:41  Transcription Date and Time:03/04/2015 16:02:28  eScription Dictation id: [id number] [id number] # [id number]      cc: Gavin Pound MD

## 2015-03-04 NOTE — Progress Notes (Signed)
.  This office note has been dictated. Account number [id number]

## 2015-05-30 ENCOUNTER — Ambulatory Visit (HOSPITAL_BASED_OUTPATIENT_CLINIC_OR_DEPARTMENT_OTHER): Payer: Medicaid Other | Admitting: Physical Medicine & Rehabilitation

## 2015-07-11 ENCOUNTER — Emergency Department (HOSPITAL_BASED_OUTPATIENT_CLINIC_OR_DEPARTMENT_OTHER)
Admission: RE | Admit: 2015-07-11 | Disposition: A | Discharge status: Home / Self Care | Payer: Self-pay | Source: Emergency Department | Attending: Emergency Medicine | Admitting: Emergency Medicine

## 2015-07-11 ENCOUNTER — Encounter (HOSPITAL_BASED_OUTPATIENT_CLINIC_OR_DEPARTMENT_OTHER): Payer: Self-pay

## 2015-07-11 LAB — COMPREHENSIVE METABOLIC PANEL
ALANINE AMINOTRANSFERASE: 30 U/L (ref 12–45)
ALBUMIN: 3.6 g/dL (ref 3.4–5.0)
ALKALINE PHOSPHATASE: 72 U/L (ref 45–117)
ANION GAP: 11 mmol/L (ref 5–15)
ASPARTATE AMINOTRANSFERASE: 17 U/L (ref 8–34)
BILIRUBIN TOTAL: 0.5 mg/dL (ref 0.2–1.0)
BUN (UREA NITROGEN): 16 mg/dL (ref 7–18)
CALCIUM: 9.9 mg/dL (ref 8.5–10.1)
CARBON DIOXIDE: 26 mmol/L (ref 21–32)
CHLORIDE: 106 mmol/L (ref 98–107)
CREATININE: 1.1 mg/dL (ref 0.4–1.2)
ESTIMATED GLOMERULAR FILT RATE: 53 mL/min — ABNORMAL LOW (ref >60)
Glucose Random: 98 mg/dL (ref 74–160)
POTASSIUM: 3.3 mmol/L — ABNORMAL LOW (ref 3.5–5.1)
SODIUM: 143 mmol/L (ref 136–145)
TOTAL PROTEIN: 7.4 g/dL (ref 6.4–8.2)

## 2015-07-11 LAB — CBC, PLATELET & DIFFERENTIAL
ABSOLUTE BASO COUNT: 0 10*3/uL (ref 0.0–0.1)
ABSOLUTE EOSINOPHIL COUNT: 0 10*3/uL (ref 0.0–0.8)
ABSOLUTE IMM GRAN COUNT: 0.06 10*3/uL — ABNORMAL HIGH (ref 0.00–0.03)
ABSOLUTE LYMPH COUNT: 4.1 10*3/uL (ref 0.6–5.9)
ABSOLUTE MONO COUNT: 1.1 10*3/uL (ref 0.2–1.4)
ABSOLUTE NEUTROPHIL COUNT: 8.3 10*3/uL (ref 1.6–8.3)
BASOPHIL %: 0.2 % (ref 0.0–1.2)
EOSINOPHIL %: 0.1 % (ref 0.0–7.0)
HEMATOCRIT: 41.5 % (ref 34.1–44.9)
HEMOGLOBIN: 13.9 g/dL (ref 11.2–15.7)
IMMATURE GRANULOCYTE %: 0.4 % (ref 0.0–0.4)
LYMPHOCYTE %: 30.1 % (ref 15.0–54.0)
MEAN CORP HGB CONC: 33.5 g/dL (ref 31.0–37.0)
MEAN CORPUSCULAR HGB: 28.6 pg (ref 26.0–34.0)
MEAN CORPUSCULAR VOL: 85.4 fL (ref 80.0–100.0)
MEAN PLATELET VOLUME: 12.7 fL — ABNORMAL HIGH (ref 8.7–12.5)
MONOCYTE %: 8 % (ref 4.0–13.0)
NEUTROPHIL %: 61.2 % (ref 40.0–75.0)
PLATELET COUNT: 209 10*3/uL (ref 150–400)
RBC DISTRIBUTION WIDTH STD DEV: 39.2 fL (ref 35.1–46.3)
RBC DISTRIBUTION WIDTH: 12.9 % (ref 11.5–14.3)
RED BLOOD CELL COUNT: 4.86 M/uL (ref 3.90–5.20)
WHITE BLOOD CELL COUNT: 13.6 10*3/uL — ABNORMAL HIGH (ref 4.0–11.0)

## 2015-07-11 LAB — TROPONIN I: TROPONIN I: <0.02 ng/mL (ref 0.00–0.04)

## 2015-07-11 LAB — CT HEAD WO CONTRAST

## 2015-07-11 LAB — HCG QUALITATIVE SERUM: HCG QUALITATIVE SERUM: NEGATIVE

## 2015-07-11 LAB — NT-PROBNP: NT-proBNP: 52 pg/mL (ref 0–125)

## 2015-07-11 LAB — MAGNESIUM: MAGNESIUM: 1.9 mg/dL (ref 1.8–2.4)

## 2015-07-11 MED ORDER — DIPHENHYDRAMINE HCL 50 MG/ML IJ SOLN
25.00 mg | Freq: Once | INTRAMUSCULAR | Status: AC
Start: 2015-07-11 — End: 2015-07-11
  Administered 2015-07-11: 25 mg via INTRAVENOUS
  Filled 2015-07-11: qty 1

## 2015-07-11 MED ORDER — METOCLOPRAMIDE HCL 5 MG/ML IJ SOLN
10.00 mg | Freq: Once | INTRAMUSCULAR | Status: AC
Start: 2015-07-11 — End: 2015-07-11
  Administered 2015-07-11: 10 mg via INTRAVENOUS
  Filled 2015-07-11: qty 2

## 2015-07-11 MED ORDER — SODIUM CHLORIDE 0.9 % IV BOLUS
1000.0000 mL | Freq: Once | INTRAVENOUS | Status: AC
  Administered 2015-07-11: 1000 mL via INTRAVENOUS

## 2015-07-11 MED ORDER — POTASSIUM CHLORIDE CRYS ER 20 MEQ PO TBCR
40.00 meq | EXTENDED_RELEASE_TABLET | Freq: Once | ORAL | Status: AC
Start: 2015-07-11 — End: 2015-07-11
  Administered 2015-07-11: 40 meq via ORAL
  Filled 2015-07-11: qty 2

## 2015-07-11 NOTE — Narrator Note (Signed)
To CT scan accpmpanied by CT Tech

## 2015-07-11 NOTE — ED Triage Note (Signed)
Patient reports episode of severe headache, sob and foot pain this after noon. Arivves with continued 8:10 headache and right foot pain, now tender to touch

## 2015-07-11 NOTE — Narrator Note (Signed)
Patient Disposition    Patient education for diagnosis, medications, activity, diet and follow-up.  Patient left ED 10:01 PM.  Patient rep received written instructions.  Interpreter to provide instructions: No    Patient belongings with patient: YES    Have all existing LDAs been addressed? Yes    Have all IV infusions been stopped? Yes    Discharged to: Discharged to home

## 2015-07-11 NOTE — ED Provider Notes (Signed)
eMERGENCY dEPARTMENT Resident NOTE    The ED nursing record was reviewed.   The prior medical records as available electronically through Epic were reviewed.  This patient was seen with Emergency Department attending physician [place]    Patient presents with:  VERIFY CHIEF COMPLAINT: HEADACHE, VOMITING, SOB, FOOT PAIN-ID SHOWN      HPI    Amanda Cervantes is a 50 year old female who presents with headache, foot pain.     The headache started mild at 2:40 PM but by 3pm the headache worsened until it was severe and  she became nauseous and had a single episode of nb/nb clear/mucus-like emesis. Afterwards she noted her left foot was sore and cramping, and that she felt tearful and mildly dispneic. This improved with cold water and a fan on her face.    The headache was bilateral, superior head, dull. She has headaches before and feels like they have improved with better hypertension control, and this feels similar to prior. Not WHOL.    No f/c/s.    REVIEW OF SYSTEMS     The pertinent positives are reviewed in the HPI above. All other systems were reviewed and are negative.    PAST MEDICAL HISTORY    No past medical history on file.    PROBLEM LIST  Patient Active Problem List:     Obesity (BMI 30-39.9)     Essential hypertension, benign     Tobacco dependence     Pre-diabetes --???DM     Left knee pain     Left leg pain     Thumb pain      SURGICAL HISTORY    Past Surgical History:  No date: TUBAL LIGATION    CURRENT MEDICATIONS    No current facility-administered medications for this encounter.     Current Outpatient Prescriptions:     chlorthalidone (HYGROTEN) 25 MG tablet, Take 1 tablet by mouth daily, Disp: 90 tablet, Rfl: 4    lisinopril (PRINIVIL,ZESTRIL) 40 MG tablet, Take 1 tablet by mouth daily, Disp: 90 tablet, Rfl: 6    ergocalciferol (VITAMIN D2) 50000 UNIT capsule, Take 1 capsule by mouth once a week., Disp: 4 capsule, Rfl: 2    ALLERGIES    Review of Patient's  Allergies indicates:   Penicillins                 Comment:Took skin 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 & was positive, never             actually took it    FAMILY HISTORY      Family History    Heart Disease Father     Comment: died age 31 from MI; learned about HTN in 18's    Cancer - Other Mother     Comment: stomach cancer, died    GI Sister     Comment: hernia    Thyroid Sister        SOCIAL HISTORY    Social History    Marital status: Legally Separated   Spouse name:                       Years of education:                 Number of children:               Social History Main Topics    Smoking status: Former Smoker  Packs/day: 0.33      Years: 10.00          Types: Cigarettes       Quit date: 10/15/2012    Comment: quit 3 months ago on 10/15/12    Alcohol use: Yes                Comment: minimal    Drug use: No              Sexual activity: Yes               Partners with: Female       Birth control/protection: Tubal Ligation    Social History Narrative    she is originally from [place] ,[place].  She came to the [place] in 2005     In [place], she worked as a Musician.     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    She lives with boyfriend & daughter; no DV;      she has 2 children, age 11 y/o son in [place] & 11 y/o daughter lives with her          PHYSICAL EXAM      Vital Signs: There were no vitals taken for this visit.     Constitutional: Well-developed, Well-nourished, Non-toxic appearance. Speaking full sentences.  HEENT: atraumatic, oropharynx clear  Neck:  Normal range of motion, non-tender, Supple with no meningismus; no stridor.   Lymphatic: No lymphadenopathy noted.   Cardio.: RRR, No MRG's. Radial pulses 2+ L;  No LE edema  Pulmonary: Normal BS's bilat., No tachypnea, wheezes, rales or rhonchi;  Non-labored w/o retractions or accessory muscle use, or tripoding  Abd.  Soft, NTND, No masses, rebound or guarding. No Murphy's,  McBurney's, or Rovsing's.   GU:  No CVA tenderness.  Musculoskeletal:  Moving all 4 extremities, No major deformities noted. Ambulatory with steady gait.  Neurological: CNIII-XII intact, AOx3, EOMI, PERRLA 62mm. Sensation is intact and symmetric in b/l UE/LE. Motor strength is 5/5 to flexion/extension of all joints in b/l UE/LE and symmetric. No meningismus.  Gait WNL. Psychiatric: Appropriate for age and situation.        RESULTS  No results found for this visit on 07/11/15 (from the past 24 hour(s)).     RADIOLOGY  NCHCT    EKG:  obtained    PROCEDURES  none    MEDICATIONS ADMINISTERED ON THIS VISIT  No orders of the defined types were placed in this encounter.      ED COURSE & MEDICAL DECISION MAKING      I reviewed the patient's past medical history/problem list, past surgical history, medication list, social history and allergies.    ED Decision Making & Course: Pt is a 50 year old female who presents with severe headache which was maximal within an hour causing her to vomit and have other symptoms which seem related to the pain.  Her symptoms are within 6 hours and we will CT which is extremely sensitive for [facility].  We'll treat symptomatically with migraine cocktail.  Pt remained hemodynamically stable during their stay in the emergency department.    Her CT demonstrated no acute bleed or evidence of SAH.  On reevaluation she was feeling much better and desired to go home to sleep and rest.  Her headache had resolved.    Laboratory investigations for other causes of her brief episode of dyspnea were unremarkable. Her potassium was 3.3 and was  repleted PO.    Follow Up: PCP

## 2015-07-11 NOTE — Discharge Instructions (Signed)
You were seen in the emergency Department for your headache.  We evaluated your symptoms with a history, physical exam including neurologic exam, EKG, the board her tests, and head CT.  These investigations did not reveal a cause of your symptoms that would require immediate hospitalization or intervention.  Therefore we are discharging you so that she may follow-up with your primary care provider.    General Headache Without Cause  A headache is pain or discomfort felt around the head or neck area. The specific cause of a headache may not be found. There are many causes and types of headaches. A few common ones are:   Tension headaches.   Migraine headaches.   Cluster headaches.   Chronic daily headaches.  HOME CARE INSTRUCTIONS    Keep all follow-up appointments with your health care provider or any specialist referral.   Only take over-the-counter or prescription medicines for pain or discomfort as directed by your health care provider.   Lie down in a dark, quiet room when you have a headache.   Keep a headache journal to find out what may trigger your migraine headaches. For example, write down:    What you eat and drink.    How much sleep you get.    Any change to your diet or medicines.   Try massage or other relaxation techniques.   Put ice packs or heat on the head and neck. Use these 3 to 4 times per day for 15 to 20 minutes each time, or as needed.   Limit stress.   Sit up straight, and do not tense your muscles.   Quit smoking if you smoke.   Limit alcohol use.   Decrease the amount of caffeine you drink, or stop drinking caffeine.   Eat and sleep on a regular schedule.   Get 7 to 9 hours of sleep, or as recommended by your health care provider.   Keep lights dim if bright lights bother you and make your headaches worse.  SEEK MEDICAL CARE IF:    You have problems with the medicines you were prescribed.   Your medicines are not working.   You have a change from the usual  headache.   You have nausea or vomiting.  SEEK IMMEDIATE MEDICAL CARE IF:    Your headache becomes severe.   You have a fever.   You have a stiff neck.   You have loss of vision.   You have muscular weakness or loss of muscle control.   You start losing your balance or have trouble walking.   You feel faint or pass out.   You have severe symptoms that are different from your first symptoms.     This information is not intended to replace advice given to you by your health care provider. Make sure you discuss any questions you have with your health care provider.     Document Released: 03/05/2005 Document Revised: 07/20/2014 Document Reviewed: 03/21/2011  Elsevier Interactive Patient Education [employer].

## 2015-07-11 NOTE — ED Provider Notes (Signed)
This patient was evaluated in the ED for headache/shortness of breath, although initial history and physical exam information was obtained by the resident Dr. Tommi Rumps, who also made a record of this visit. I independently examined and evaluated this patient and made all diagnostic, treatment, and disposition decisions.    Physical examination  Vital signs:BP 98/61   Pulse 66   Temp 98.6 F   Resp 14   Wt 88.5 kg (195 lb)   SpO2 100%   BMI 33.29 kg/m2     Constitutional:  No acute distress, non-toxic appearance   Eyes: Normal lids/lashes, sclerae anicteric  HENT:  Atraumatic, external ears normal, nose normal  Respiratory:  No respiratory distress, normal breath sounds, no rales, no wheezing   Cardiovascular:  Normal rate, regular rhythm, no murmurs  GI:  Soft, nondistended, non-tender  Musculoskeletal: No deformities  Integument:  Well hydrated, no rash   Neurologic:  Alert, awake, interactive.  Cranial nerves, motor, sensory grossly intact.  Psychiatric: Behavior appropriate    Labs Reviewed   CBC + PLT + AUTO DIFF - Abnormal; Notable for the following:        Result Value    WHITE BLOOD CELL COUNT 13.6 (*)     MEAN PLATELET VOLUME 12.7 (*)     ABSOLUTE IMM GRAN COUNT 0.06 (*)     All other components within normal limits   COMPREHENSIVE METABOLIC PANEL - Abnormal; Notable for the following:     POTASSIUM 3.3 (*)     ESTIMATED GLOMERULAR FILT RATE 53 (*)     All other components within normal limits   TROPONIN I   NT-PROBNP   MAGNESIUM   HCG QUALITATIVE SERUM   URINE PREGNANCY TEST (POINT OF CARE)         Emergency Department course and medical decision-making:  Patient is a 50 year old female seen in the emergency department with concern for headache/shortness of breath. EKG shows normal sinus rhythm at 79, normal axis, QTC 447, no ST-T wave changes, no evidence of acute ischemia.  CT head shows no acute intracranial abnormality.  Chest x-ray shows no infiltrate or pneumothorax.  Laboratory evaluation remarkable  for elevated white blood cell count of 13.6, low potassium of 3.3. Patient was treated with medication for control, with good effect.  Evaluation is not consistent with subarachnoid hemorrhage.  Plan to have patient follow up with PCP. Patient stable for discharge home.    Impression:  Acute nonintractable headache, unspecified headache type  (primary encounter diagnosis)  Shortness of breath    Electronically signed by: Richrd Prime, MD      This Emergency Department patient encounter note was created using voice-recognition software and in real time during the ED visit. Please excuse any typographical errors that have not been edited out.

## 2015-07-12 LAB — XR CHEST 2 VIEWS

## 2015-07-13 ENCOUNTER — Encounter (HOSPITAL_BASED_OUTPATIENT_CLINIC_OR_DEPARTMENT_OTHER): Payer: Self-pay | Admitting: Internal Medicine

## 2015-07-13 DIAGNOSIS — R51 Headache: Secondary | ICD-10-CM

## 2015-07-13 DIAGNOSIS — R519 Headache, unspecified: Secondary | ICD-10-CM | POA: Insufficient documentation

## 2015-07-15 LAB — EKG

## 2015-08-31 ENCOUNTER — Other Ambulatory Visit (HOSPITAL_BASED_OUTPATIENT_CLINIC_OR_DEPARTMENT_OTHER): Payer: Self-pay | Admitting: Internal Medicine

## 2015-08-31 DIAGNOSIS — I10 Essential (primary) hypertension: Principal | ICD-10-CM

## 2015-08-31 MED ORDER — CHLORTHALIDONE 25 MG PO TABS
25.0000 mg | ORAL_TABLET | Freq: Every day | ORAL | 4 refills | Status: DC
Start: 2015-08-31 — End: 2016-01-09

## 2015-08-31 NOTE — Progress Notes (Signed)
PER Pharmacy, Amanda Cervantes is a 50 year old female has requested a refill of CHLORTHALIDONE 25MG .      Last [facility] Office Visit: 09/22/2014  Last Physical Exam: 03/01/2014      Other Med Adult:  Most Recent BP Reading(s)  07/11/15 : 105/63          Cholesterol (mg/dl)   Date Value   10/29/2011 162   ----------    LOW DENSITY LIPOPROTEIN DIRECT (mg/dl)   Date Value   10/29/2011 101 (H)   ----------    HIGH DENSITY LIPOPROTEIN (mg/dl)   Date Value   10/29/2011 31 (L)   ----------    TRIGLYCERIDES (mg/dl)   Date Value   08/04/2009 99   ----------        THYROID SCREEN TSH REFLEX FT4 (uIU/mL)   Date Value   05/04/2014 1.530   ----------      No results found for: TSH      HEMOGLOBIN A1C (%)   Date Value   06/11/2014 6.1 (H)   ----------      No results found for: INR      SODIUM (mmol/L)   Date Value   07/11/2015 143   ----------      POTASSIUM (mmol/L)   Date Value   07/11/2015 3.3 (L)   ----------          CREATININE (mg/dL)   Date Value   07/11/2015 1.1   ----------    Documented patient preferred pharmacies:    [facility] [place], [place]  Phone: [phone] Fax: [phone]

## 2015-08-31 NOTE — Progress Notes (Signed)
[facility] INTERNAL MED    Person calling on behalf of patient: Received FAX from [place]    May list multiple medications in this section    Medicine Name: chlorthalidone (HYGROTEN) 25 MG tablet    Dosage:     Frequency (how many pills, how many times a day):     Number of pills left:     Documented patient preferred pharmacies:   [facility] [place], [place]  Phone: [phone] Fax: [phone]      Patient's language of care: [place]    Patient needs a [place] interpreter.

## 2015-11-21 ENCOUNTER — Emergency Department (HOSPITAL_BASED_OUTPATIENT_CLINIC_OR_DEPARTMENT_OTHER)
Admission: RE | Admit: 2015-11-21 | Disposition: A | Discharge status: Home / Self Care | Payer: Self-pay | Source: Emergency Department | Attending: Emergency Medicine | Admitting: Emergency Medicine

## 2015-11-21 ENCOUNTER — Encounter (HOSPITAL_BASED_OUTPATIENT_CLINIC_OR_DEPARTMENT_OTHER): Payer: Self-pay

## 2015-11-21 HISTORY — DX: Essential (primary) hypertension: I10

## 2015-11-21 LAB — CBC, PLATELET & DIFFERENTIAL
ABSOLUTE BASO COUNT: 0 10*3/uL (ref 0.0–0.1)
ABSOLUTE EOSINOPHIL COUNT: 0 10*3/uL (ref 0.0–0.8)
ABSOLUTE IMM GRAN COUNT: 0.04 10*3/uL — ABNORMAL HIGH (ref 0.00–0.03)
ABSOLUTE LYMPH COUNT: 1.9 10*3/uL (ref 0.6–5.9)
ABSOLUTE MONO COUNT: 0.8 10*3/uL (ref 0.2–1.4)
ABSOLUTE NEUTROPHIL COUNT: 7.7 10*3/uL (ref 1.6–8.3)
BASOPHIL %: 0.2 % (ref 0.0–1.2)
EOSINOPHIL %: 0.4 % (ref 0.0–7.0)
HEMATOCRIT: 36.4 % (ref 34.1–44.9)
HEMOGLOBIN: 11.9 g/dL (ref 11.2–15.7)
IMMATURE GRANULOCYTE %: 0.4 % (ref 0.0–0.4)
LYMPHOCYTE %: 18.4 % (ref 15.0–54.0)
MEAN CORP HGB CONC: 32.7 g/dL (ref 31.0–37.0)
MEAN CORPUSCULAR HGB: 28.7 pg (ref 26.0–34.0)
MEAN CORPUSCULAR VOL: 87.7 fL (ref 80.0–100.0)
MEAN PLATELET VOLUME: 11.3 fL (ref 8.7–12.5)
MONOCYTE %: 7.7 % (ref 4.0–13.0)
NEUTROPHIL %: 72.9 % (ref 40.0–75.0)
PLATELET COUNT: 174 10*3/uL (ref 150–400)
RBC DISTRIBUTION WIDTH STD DEV: 40 fL (ref 35.1–46.3)
RBC DISTRIBUTION WIDTH: 12.8 % (ref 11.5–14.3)
RED BLOOD CELL COUNT: 4.15 M/uL (ref 3.90–5.20)
WHITE BLOOD CELL COUNT: 10.5 10*3/uL (ref 4.0–11.0)

## 2015-11-21 LAB — BASIC METABOLIC PANEL
ANION GAP: 8 mmol/L (ref 5–15)
BUN (UREA NITROGEN): 16 mg/dL (ref 7–18)
CALCIUM: 9.3 mg/dL (ref 8.5–10.1)
CARBON DIOXIDE: 27 mmol/L (ref 21–32)
CHLORIDE: 105 mmol/L (ref 98–107)
CREATININE: 0.8 mg/dL (ref 0.4–1.2)
ESTIMATED GLOMERULAR FILT RATE: >60 mL/min (ref >60)
Glucose Random: 99 mg/dL (ref 74–160)
POTASSIUM: 3.9 mmol/L (ref 3.5–5.1)
SODIUM: 140 mmol/L (ref 136–145)

## 2015-11-21 LAB — POC URINALYSIS
BILIRUBIN, URINE: NEGATIVE
GLUCOSE,URINE: NEGATIVE
KETONE, URINE: NEGATIVE
LEUKOCYTE ESTERASE: NEGATIVE
NITRITE, URINE: NEGATIVE
PH URINE: 6.5 (ref 5.0–8.0)
PROTEIN, URINE: NEGATIVE
SPECIFIC GRAVITY, URINE: 1.01 (ref 1.003–1.030)
UROBILINOGEN URINE: 0.2 (ref 0.2–1.0)

## 2015-11-21 LAB — URINE PREGNANCY TEST (POINT OF CARE): HCG QUALITATIVE URINE: NEGATIVE

## 2015-11-21 MED ORDER — DICYCLOMINE HCL 10 MG PO CAPS
20.00 mg | ORAL_CAPSULE | Freq: Once | ORAL | Status: AC
Start: 2015-11-21 — End: 2015-11-21
  Administered 2015-11-21: 20 mg via ORAL
  Filled 2015-11-21: qty 2

## 2015-11-21 MED ORDER — IOHEXOL 300 MG/ML IJ SOLN
100.00 mL | Freq: Once | INTRAMUSCULAR | Status: AC
Start: 2015-11-21 — End: 2015-11-21
  Administered 2015-11-21: 100 mL via INTRAVENOUS

## 2015-11-21 MED ORDER — KETOROLAC TROMETHAMINE 15 MG/ML IJ SOLN
15.0000 mg | Freq: Once | INTRAMUSCULAR | Status: AC
Start: 2015-11-21 — End: 2015-11-21
  Administered 2015-11-21: 15 mg via INTRAVENOUS
  Filled 2015-11-21: qty 1

## 2015-11-21 MED ORDER — CIPROFLOXACIN HCL 500 MG PO TABS
500.0000 mg | ORAL_TABLET | Freq: Once | ORAL | Status: AC
  Administered 2015-11-21: 500 mg via ORAL
  Filled 2015-11-21: qty 1

## 2015-11-21 MED ORDER — CIPROFLOXACIN HCL 500 MG PO TABS
500.00 mg | ORAL_TABLET | Freq: Two times a day (BID) | ORAL | 0 refills | Status: AC
Start: 2015-11-21 — End: 2015-11-28

## 2015-11-21 MED ORDER — METRONIDAZOLE 500 MG PO TABS: 500 mg | tablet | Freq: Three times a day (TID) | ORAL | 0 refills | 0 days | Status: AC

## 2015-11-21 MED ORDER — METRONIDAZOLE 500 MG PO TABS
500.00 mg | ORAL_TABLET | Freq: Three times a day (TID) | ORAL | 0 refills | Status: AC
Start: 2015-11-21 — End: 2015-11-28

## 2015-11-21 MED ORDER — SODIUM CHLORIDE 0.9 % IV BOLUS
1000.0000 mL | Freq: Once | INTRAVENOUS | Status: AC
Start: 2015-11-21 — End: 2015-11-21
  Administered 2015-11-21: 1000 mL via INTRAVENOUS

## 2015-11-21 MED ORDER — METRONIDAZOLE 500 MG PO TABS
500.00 mg | ORAL_TABLET | Freq: Once | ORAL | Status: AC
Start: 2015-11-21 — End: 2015-11-21
  Administered 2015-11-21: 500 mg via ORAL
  Filled 2015-11-21: qty 1

## 2015-11-21 MED ORDER — FAMOTIDINE 20 MG/2ML IV SOLN
20.00 mg | Freq: Once | INTRAVENOUS | Status: AC
Start: 2015-11-21 — End: 2015-11-21
  Administered 2015-11-21: 20 mg via INTRAVENOUS
  Filled 2015-11-21: qty 2

## 2015-11-21 MED ORDER — CIPROFLOXACIN HCL 500 MG PO TABS: 500 mg | tablet | Freq: Two times a day (BID) | ORAL | 0 refills | 0 days | Status: AC

## 2015-11-21 MED ORDER — IOHEXOL 240 MG/ML IJ SOLN
525.00 mL | INTRAMUSCULAR | Status: AC | PRN
Start: 2015-11-21 — End: 2015-11-21
  Administered 2015-11-21 (×2): 525 mL via ORAL

## 2015-11-21 NOTE — Narrator Note (Signed)
Pt states she still cannot provide a urine sample at this time.  Fluids infusing.

## 2015-11-21 NOTE — Narrator Note (Signed)
Pt reports headache has resolved, still reporting left sided tenderness.  Pt to have CT scan.

## 2015-11-21 NOTE — Narrator Note (Signed)
Pt to CT

## 2015-11-21 NOTE — ED Triage Note (Signed)
Pt reports left lower quadrant abd pain since yesterday, reports it is intermittent.  Denies nausea, vomiting or diarrhea.  Denies urinary symptoms. Pt also c/o headache.

## 2015-11-21 NOTE — Narrator Note (Signed)
Pt tolerating PO fluids well, appears comfortable

## 2015-11-21 NOTE — Narrator Note (Signed)
Pt up to the bathroom, in NAD.

## 2015-11-21 NOTE — ED Notes (Signed)
This patient was signed out to me.  A chest follow up on the results the CT scan.  CT shows diverticulitis without perforation or abscess.  Patient was given Cipro and Flagyl.  Reasons to return were discussed.

## 2015-11-21 NOTE — Narrator Note (Signed)
Patient Disposition    Patient education for diagnosis, medications, activity, diet and follow-up.  Patient left ED 7:25 PM.  Patient rep received written instructions.  Interpreter to provide instructions: Yes    Patient belongings with patient: YES    Have all existing LDAs been addressed? N/A    Have all IV infusions been stopped? N/A    Discharged to: Discharged to home

## 2015-11-21 NOTE — ED Provider Notes (Signed)
[facility] Emergency Medicine Attending Note      History of Present Illness:    Amanda Cervantes is a 50 year old female who came to the ED complaining of low abdominal pain since yesterday, left more than right.  No vomiting or diarrhea.  No recent travel or antibiotics.  No known sick contacts.  She hasn't had pain like this before.  No history of diverticulitis.  Also mild headache.  No fever.   Amanda Cervantes   MRN: [id number]  PCP: Gavin Pound, MD    Arrived to the ED by: Self    History provided by: the patient using a [place] translator  Other: Reviewed nursing notes      Review of Systems:  As per HPI. All other systems were reviewed and are negative        Past Medical Hx:  Past Medical History:  No date: HTN (hypertension) Meds:    No current facility-administered medications for this encounter.   Current Outpatient Prescriptions:  metroNIDAZOLE (FLAGYL) 500 MG tablet Take 1 tablet by mouth 3 (three) times daily Disp: 21 tablet Rfl: 0   ciprofloxacin (CIPRO) 500 MG tablet Take 1 tablet by mouth 2 (two) times daily Disp: 14 tablet Rfl: 0   chlorthalidone (HYGROTEN) 25 MG tablet Take 1 tablet by mouth daily Disp: 90 tablet Rfl: 4   lisinopril (PRINIVIL,ZESTRIL) 40 MG tablet Take 1 tablet by mouth daily Disp: 90 tablet Rfl: 6   ergocalciferol (VITAMIN D2) 50000 UNIT capsule Take 1 capsule by mouth once a week. Disp: 4 capsule Rfl: 2         Past Surgical Hx:  Past Surgical History:  No date: TUBAL LIGATION Allergies:  Review of Patient's Allergies indicates:   Penicillins                 Comment:Took skin 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 & was positive, never             actually took it   Social Hx:     Smoking status: Former Smoker  0.33 Packs/day  For 10.00 Years     Types: Cigarettes    Quit date: 10/15/2012    Smokeless tobacco: Not on file    Comment: quit 3 months ago on 10/15/12    Alcohol use Yes    Comment: minimal    Immunizations:  Immunization History   Administered Date(s) Administered    INFLUENZA  VIRUS TRI W/PRESV VACCINE 18/> YRS IM (PRIVATE) 12/21/2011    Influenza Virus Quad Presv Free Vacc 3/> Yrs IM 03/01/2014    Influenza Virus Quadrivalent Vacc 3/> Yrs Im 01/15/2013    Tdap 10/29/2011        Physical Examination:    ED Triage Vitals   Enc Vitals Group      BP 11/21/15 1255 144/94      Pulse 11/21/15 1255 104      Resp 11/21/15 1255 18      Temp 11/21/15 1255 99.1 F      Temp src --       SpO2 11/21/15 1255 99 %      Weight --       Height --       Head Cir --       Peak Flow --       Pain Score --       Pain Loc --       Pain Edu? --       Excl. in  GC? --         General: Patient is in no acute distress, cooperative with exam    Eyes: PERRL, anicteric    Head, ears, nose, and throat: Normocephalic and atraumatic. Moist mucous membranes. Normal phonation.    Respiratory/chest: No respiratory distress, speaks in full sentences. Breath sounds are clear and equal bilaterally.    Cardiovascular: Heart rate is regular in rate and rhythm. Pulses are 2+ and symmetric.     Gastrointestinal: Abdomen is soft, mild bilateral lower abdominal tenderness , no rebound    Back: No CVA tenderness.    Musculoskeletal: Normal muscle tone, moving all extremities, normal gait.    Skin: Warm and well perfused, no rashes or erythema/ecchymosis.    Neurologic: Alert and oriented x 3, no focal deficits.    Medications Given in the ED:    Medications   sodium chloride 0.9 % IV bolus 1,000 mL (0 mLs Intravenous Stopped 11/21/15 1709)   famotidine (PEPCID) injection 20 mg (20 mg Intravenous Given 11/21/15 1411)   ketorolac (TORADOL) injection 15 mg (15 mg Intravenous Given 11/21/15 1419)   dicyclomine (BENTYL) capsule 20 mg (20 mg Oral Given 11/21/15 1419)   iohexol (OMNIPAQUE) solution 525 mL (525 mLs Oral Given 11/21/15 1640)   iohexol (OMNIPAQUE) 300 MG/ML injection 100 mL (100 mLs Intravenous Given 11/21/15 1835)   metroNIDAZOLE (FLAGYL) tablet 500 mg (500 mg Oral Given 11/21/15 1916)   ciprofloxacin (CIPRO) tablet 500 mg (500 mg Oral  Given 11/21/15 1916)    Radiology and ECG:    CT Abd/Pelvis (IV and Oral contrast)   Final Result           Lab Results:    Labs Reviewed   CBC + PLT + AUTO DIFF - Abnormal; Notable for the following:        Result Value    ABSOLUTE IMM GRAN COUNT 0.04 (*)     All other components within normal limits   POC URINALYSIS - Abnormal; Notable for the following:     OCCULT BLOOD, URINE LARGE (*)     All other components within normal limits   BASIC METABOLIC PANEL   URINE PREGNANCY TEST (POINT OF CARE)    Vital Signs:     11/21/15  1255 11/21/15  1709 11/21/15  1924   BP: 144/94 137/84 140/83   Pulse: 104 61 60   Resp: 18 18 18    Temp: 99.1 F     SpO2: 99% 99% 99%          ED Course and Medical Decision Making:    Patient looks quite well, initially tried treated symptomatically with IV fluids as well as Pepcid and Bentyl.  Blood work looked okay.  On reassessment she said she was feeling only slightly better, pain down to 5 out of 10.  On exam her pain is now more left-sided, no history of diverticulitis but that would be the highest on differential.  We'll start have her drinking for CT scan abdomen and pelvis.    ED Disposition:     Impression(s): Abdominal pain    Disposition:  Signed out Dr Jani Files at change of shift      Signed by Dr. Freddie Breech. Glori Bickers, [place]  Emergency Medicine Attending  [facility]

## 2015-11-21 NOTE — Narrator Note (Signed)
Pt awaiting CT.  

## 2015-11-21 NOTE — Narrator Note (Signed)
Pt completed PO contrast

## 2015-11-21 NOTE — Narrator Note (Signed)
Pt back from CT

## 2015-11-21 NOTE — Narrator Note (Signed)
Pt started drinking PO contrast.

## 2015-11-22 LAB — CT ABDOMEN & PELVIS W IV CONTRAST

## 2015-12-27 NOTE — Progress Notes (Signed)
This encounter was opened in error.  Please disregard.

## 2016-01-09 ENCOUNTER — Encounter (HOSPITAL_BASED_OUTPATIENT_CLINIC_OR_DEPARTMENT_OTHER): Payer: Self-pay | Admitting: Internal Medicine

## 2016-01-09 ENCOUNTER — Ambulatory Visit (HOSPITAL_BASED_OUTPATIENT_CLINIC_OR_DEPARTMENT_OTHER): Payer: Medicaid Other | Admitting: Internal Medicine

## 2016-01-09 ENCOUNTER — Encounter (HOSPITAL_BASED_OUTPATIENT_CLINIC_OR_DEPARTMENT_OTHER): Payer: Self-pay

## 2016-01-09 VITALS — BP 140/100 | HR 88 | Temp 98.3°F | Wt 200.0 lb

## 2016-01-09 DIAGNOSIS — R7303 Prediabetes: Secondary | ICD-10-CM

## 2016-01-09 DIAGNOSIS — M79672 Pain in left foot: Secondary | ICD-10-CM

## 2016-01-09 DIAGNOSIS — K5732 Diverticulitis of large intestine without perforation or abscess without bleeding: Secondary | ICD-10-CM | POA: Insufficient documentation

## 2016-01-09 DIAGNOSIS — Z23 Encounter for immunization: Secondary | ICD-10-CM

## 2016-01-09 DIAGNOSIS — Z0000 Encounter for general adult medical examination without abnormal findings: Principal | ICD-10-CM

## 2016-01-09 DIAGNOSIS — E669 Obesity, unspecified: Secondary | ICD-10-CM

## 2016-01-09 DIAGNOSIS — I10 Essential (primary) hypertension: Secondary | ICD-10-CM

## 2016-01-09 MED ORDER — CHLORTHALIDONE 25 MG PO TABS: 25 mg | tablet | Freq: Every day | ORAL | 12 refills | 0 days | Status: DC

## 2016-01-09 MED ORDER — PEG 3350-KCL-NA BICARB-NACL 420 G PO SOLR: 4000 mL | mL | Freq: Once | ORAL | 0 refills | 0 days | Status: AC

## 2016-01-09 MED ORDER — LISINOPRIL 40 MG PO TABS: 40 mg | tablet | Freq: Every day | ORAL | 12 refills | 0 days | Status: DC

## 2016-01-09 MED ORDER — CHLORTHALIDONE 25 MG PO TABS
25.0000 mg | ORAL_TABLET | Freq: Every day | ORAL | 12 refills | Status: DC
Start: 2016-01-09 — End: 2017-02-21

## 2016-01-09 MED ORDER — PEG 3350-KCL-NA BICARB-NACL 420 G PO SOLR
4000.00 mL | Freq: Once | ORAL | 0 refills | Status: AC
Start: 2016-01-09 — End: 2016-01-09

## 2016-01-09 MED ORDER — LISINOPRIL 40 MG PO TABS
40.0000 mg | ORAL_TABLET | Freq: Every day | ORAL | 12 refills | Status: DC
Start: 2016-01-09 — End: 2017-02-21

## 2016-01-09 NOTE — Addendum Note (Signed)
Addended by: Chuck Hint on: 01/09/2016 04:03 PM     Modules accepted: Orders, SmartSet

## 2016-01-09 NOTE — Progress Notes (Signed)
01/09/2016  VIS given prior to administration and reviewed with the patient and or legal guardian. Patient understands the disease and the vaccine. See immunization/Injection module or chart review for date of publication and additional information.  Aimee Heldman, RN

## 2016-01-09 NOTE — Progress Notes (Signed)
Cc: multiple issues & PE    Diverticulitis  Recent ED visit dx w/ diverticulitis  Notes that the abd pain has completely resolved  Took the antibiotics as prescribed  The patient denies abdominal pain, anorexia, nausea or vomiting, dysphagia, odynophagia, change in bowel habits or black or bloody stools.    Has many questions about this diagnosis    Forefoot pain  She notes months of pain in the front of her left foot  This pain is worse with walking & she thinks it might be causing pain in her legs as well but she's not sure  No redness or swelling  Does not wear supportive shoes    HTN/pre-DM  See EPIC Problem List for HPI.  She reports taking the hypertension medications as on her EPIC med list below.  She reports difficulty with compliance -- not currently taking the ACE inhibitor just the thiazide, notes that she didn't have refills  She denies chest pain, dyspnea, headache, lower extremity edema or localized weakness or paresthesias  Her last three blood pressures in clinic were:  Most Recent BP Reading(s)  01/09/16 : (!) 140/100  11/21/15 : 140/83  07/11/15 : 105/63    Her Most Recent Weight Reading(s)  01/09/16 : 90.7 kg (200 lb)  07/11/15 : 88.5 kg (195 lb)  09/22/14 : 90.7 kg (200 lb)  09/14/14 : 89 kg (196 lb 3.2 oz)      last potassium was   POTASSIUM (mmol/L)   Date Value   11/21/2015 3.9   ----------  last creatinine was   CREATININE (mg/dL)   Date Value   11/21/2015 0.8   ----------    Patient Active Problem List    Headache disorder         Priority: High [1]         Date Noted: 07/13/2015            4/17 ED visit for headache, CT head wnl      Thumb pain         Priority: High [1]         Date Noted: 07/14/2014            4/16 when at physiatry: her thumb pain, I asked            that she discuss this further with Dr. Gavin Pound as this appears to be mostly a vesicle             within the subcutaneous layer.       Left leg pain         Priority: High [1]         Date Noted:  01/15/2013            2/14 initially had left knee pain            10/14 seen for Months of right leg pain -- hard to            pin down an etiology --             suspect musculoskeletal due to work & obesity             tx with NSAID's & referred to PT                        12/15 PT not helpful; now reports leg pain  interfering with her life, has             stopped working, not very active            Pain appears to be musculoskeletal but neurologic            is possible            Referred to physiatry; use acetaminophen prn                        4/16 physiatry: bilateral knee osteoarthritis,            left worse than right. Her pain has improved            considerably with exercise and occasional            medication. She also found that the injection also            was beneficial. At this point it is             too soon to repeat the injection.             12/16 bilateral knee osteoarthritis, left worse            than right, left greater trochanteric bursa region            pain. ; s/p corticosteroid injection       Pre-diabetes --???DM         Priority: High [1]         Date Noted: 10/31/2011            8/13 HgA1c 6%            8/14 HgA1c 6.3%                        1/16 into DM range 6.8%            3*/16 back to pre-DM range, 6.1%      Obesity (BMI 30-39.9)         Priority: High [1]         Date Noted: 07/25/2009            Took compounded diet pills from [place] -- in 2009,            took for 30 days                        Feb 2016 - 3rd mtg with RD. Strong knowledge of            what to eat. Always come to appt on self made            diet. Struggles to adhere d/t lack of family            agreement and patient "burn out" for cooking            separate meals. Discussed methods to blend both            dietary patterns and still see weight loss / a1c             improvement            3/16 she's been doing a lot of hard work to lose            weight in a healthy manner, has seen  RD, not  thinking that returning is necessary at this             point      Essential hypertension, benign         Priority: High [1]         Date Noted: 07/25/2009            BMP, HCT, microalb, EKG wnl 2011; Treatment            started 7/27/13Well controlled with            chlorthalidone, had one low K+, so changed to             lisinopril              6/ 2016 BP not well controlled on lisinopril --            added back chlorthalidone             7/16 BP well controlled      Diverticulitis of large intestine without perforation or abscess without bleeding         Priority: Medium [2]         Date Noted: 01/09/2016            9/17 dx in ED following presentation for abd pain      Tobacco dependence         Priority: Medium [2]         Date Noted: 10/29/2011            Quit 7/13            2/14 continues to smoke a few cigarettes when she            goes out with friends             drinking            Quit 6/14      Left knee pain         Priority: Low [3]         Date Noted: 05/12/2012            musculoskeletal pain x 10+ years            Knee strengthening exercises have helped                        10/14 saw Dr. Ursula Alert for bilateral knee pain &            referred to PT       Reisa, Funk D   Home Medication Instructions [id number]    Printed on:01/09/16 1539   Medication Information                      chlorthalidone (HYGROTEN) 25 MG tablet  Take 1 tablet by mouth daily             ergocalciferol (VITAMIN D2) 50000 UNIT capsule  Take 1 capsule by mouth once a week.             lisinopril (PRINIVIL,ZESTRIL) 40 MG tablet  Take 1 tablet by mouth daily             polyethylene glycol-electrolytes (NULYTELY) 420 g solution  Take 4,000 mLs by mouth once               Review of Patient's Allergies indicates:   Penicillins  Comment:Took skin 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 & was positive, never             actually took it  Past Medical History:  No date: HTN (hypertension)  Past Surgical  History:  No date: TUBAL LIGATION  Social History    Marital status: Legally Separated   Spouse name:                       Years of education:                 Number of children:               Social History Main Topics    Smoking status: Former Smoker                                                                Packs/day: 0.33      Years: 10.00          Types: Cigarettes       Quit date: 10/15/2012    Comment: quit 3 months ago on 10/15/12    Alcohol use: Yes                Comment: minimal    Drug use: No              Sexual activity: Yes               Partners with: Female       Birth control/protection: Tubal Ligation    Social History Narrative    she is originally from [place] ,[place].  She came to the [place] in 2005     In [place], she worked as a Musician.     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   Eyes: anicteric conjunctiva, PERLA bilaterally.   Ears: tm's wnl bilaterally without erythema or exudate.   Oropharynx: no erythema or exudate.   Neck supple and free of adenopathy, or masses. No thyromegaly.  Lymph: no enlargement of anterior cervical, posterior cervical or supraclavicular lymph nodes   Breasts are symmetric.  No dominant, discrete, fixed  or suspicious masses are noted.  symmetric fibrocystic densities are noted bilaterally. No skin or nipple changes or axillary  nodes   Cardiovascular: regular rate & rhythm, no murmurs, gallops or rubs appreciated.   Chest: clear to auscultation bilaterally, no crackles,  rhonchi or wheezes.   Abdomen is obese, soft, no tenderness, masses or organomegaly.  Extremities: no clubbing, cyanosis or edema.    Skin is normal without suspicious lesions noted.  Mental status exam: Normal thought content, speech, affect, mood and dress are noted.  Musculoskeletal exam: within normal limits without joint swelling or deformities  Screening neurological exam is normal without focal findings.     ASSESSMENT & PLAN:  (Z00.00) Routine general medical examination at a health care facility  (primary encounter diagnosis)  Comment: reviewed all routine health maintenance issues,   We discussed the pros and cons of screening mammo -- we discussed that there are real benefits in terms of decrease risk of dying from breast cancer but there's a cost which is many procedures & treatments, many of which might not be necessary. So, if she wishes to do everything to decrease her risk of dying from breast cancer -- then I would recommend a mammogram. But she should not feel guilty if she decided not to pursue mammograms at this time.  We also discussed the option of performing mammograms q57yr or q96yr and that she can change her mind at any time in the future.  After detailed discussion & answering all her questions, she decided to have mammo done now.   Plan: [facility] MAMMOGRAPHY SCREENING BILATERAL W CAD            (K57.32) Diverticulitis of large intestine without perforation or abscess without bleeding  Comment: we discussed the dx in detail, when to return for symptoms as it can recur  We discussed that a colonoscopy is best as follow up to confirm dx - will arrange  Plan: REFERRAL TO [facility]            (M79.672) Left foot pain  Comment: no clear etiology of her left foot pain, will  Plan: REFERRAL TO PODIATRY ( INT)            (I10) Essential hypertension,  benign  Comment: we discussed that compliance is essential, will restart meds & then return for RN BP check in 2-4 weeks  Plan: chlorthalidone (HYGROTEN) 25 MG tablet            (R73.03) Pre-diabetes --???DM  Comment: her weight is up a bit, will repeat HgA1c & counseled on lifestyle changes   Plan: HEMOGLOBIN A1C, COLLECTION VENOUS BLOOD         VENIPUNCTURE            (E66.9) Obesity (BMI 30-39.9)  Comment: as above        The patient was ready to learn and no apparent learning or adherence barriers were identified. I explained the diagnosis and treatment plan, and the patient expressed understanding of the content. I attempted to answer any questions regarding the diagnosis and the proposed treatment.    Possible side effects of the prescribed medication was explained. We discussed the patients current medications.  We discussed the importance of medication compliance. The patient expressed understanding and no barriers to adherence were identified.    follow-up will be scheduled for 1 year from now  she has been advised to call or return with any worsening or new problems

## 2016-01-10 LAB — HEMOGLOBIN A1C
ESTIMATED AVERAGE GLUCOSE: 134 (ref 74–160)
HEMOGLOBIN A1C: 6.3 % — ABNORMAL HIGH (ref 4.0–5.6)

## 2016-01-11 ENCOUNTER — Encounter (HOSPITAL_BASED_OUTPATIENT_CLINIC_OR_DEPARTMENT_OTHER): Payer: Self-pay | Admitting: Internal Medicine

## 2016-01-23 ENCOUNTER — Encounter (HOSPITAL_BASED_OUTPATIENT_CLINIC_OR_DEPARTMENT_OTHER): Payer: Self-pay | Admitting: Podiatrist

## 2016-01-23 ENCOUNTER — Ambulatory Visit (HOSPITAL_BASED_OUTPATIENT_CLINIC_OR_DEPARTMENT_OTHER): Payer: Medicaid Other | Admitting: Podiatrist

## 2016-01-23 DIAGNOSIS — S99922A Unspecified injury of left foot, initial encounter: Principal | ICD-10-CM

## 2016-01-23 DIAGNOSIS — M205X2 Other deformities of toe(s) (acquired), left foot: Secondary | ICD-10-CM

## 2016-01-23 DIAGNOSIS — M79672 Pain in left foot: Secondary | ICD-10-CM

## 2016-01-23 NOTE — Progress Notes (Signed)
Podiatric medicine and surgery note    CHIEF COMPLAINT    Patient presents with:  Referral: left forefoot pain, and toe nail twisting      HPI    Amanda Cervantes is a 50 year old female who presents with ongoing pain of her left forefoot.  She states that this has been present for approximately 1 year.  She cannot identify any history of trauma.  She appreciates progressive deformity of the second digit with early overlap on the hallux.  She states that the pain is noted with ambulatory activity.  She appreciates progression of the condition.  She has attempted shoe and activity modification with limited relief.  01/23/2016  2:45 PM.    REVIEW OF SYSTEMS    See HPI for further details. Review of systems otherwise negative.     PAST MEDICAL HISTORY  Past Medical History:  No date: HTN (hypertension)    SURGICAL HISTORY  Past Surgical History:  No date: LAPAROSCOPY FULGURATION OVIDUCTS  No date: OB ANTEPARTUM CARE CESAREAN DLVR & POSTPARTUM  No date: TUBAL LIGATION    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 & was positive, never             actually took it    SOCIAL HISTORY  Social History    Marital status: Legally Separated   Spouse name:                       Years of education:                 Number of children:               Social History Main Topics    Smoking status: Former Smoker                                                                Packs/day: 0.33      Years: 10.00          Types: Cigarettes       Quit date: 10/15/2012    Comment: quit 3 months  ago on 10/15/12    Alcohol use: Yes                Comment: minimal    Drug use: No              Sexual activity: Yes               Partners with: Female       Birth control/protection: Tubal Ligation    Social History Narrative    she is originally from [place] ,[place].  She came to the  [place] in 2005     In [place], she worked as a Musician.     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  Elevated BMI.  Cardiovascular:  Exhibits a warm and well-perfused foot bilateral.  Capillary filling time is immediate.  There is no pitting edema.  Distal pulses are palpable.  There are no prominent varicosities.  There is no calf tenderness.  Musculoskeletal: Exhibits pain underlying the second metatarsal phalangeal joint left.  There is medial subluxation of the second toe left which is accentuated with loading of the forefoot.  In weightbearing attitude it is appreciated that the toe is not purchasing the ground.  There is semirigid flexion contracture at the interphalangeal joint.  There is appreciated vertical instability with mini Lachman test.  She exhibits a postural cavus type foot.  Muscle mass, strength and tone is without asymmetry and within normal limits.  Skin:  skin color, texture, turgor are normal  Lymphatic:  No lymphadenopathy noted.   Neurological: Gait normal. Reflexes normal and symmetric. Sensation grossly normal  Psychiatric:  interactive with questions.  Patient is seen with  telephone interpreter.        RADIOLOGY  Weightbearing radiographs of the left foot demonstrate no acute fractures.  There is appreciated asymmetry of the second metatarsal phalangeal joint with what appears to be widening of the lateral joint space and locally fusion.  There is symmetric narrowing of the first metatarsal phalangeal joint.  Lateral view does demonstrate the appreciated clinical cavus.  There is dorsal subluxation and hyperextension of the proximal phalanx second toe right at the metatarsal phalangeal joint.    LABS  None    PROCEDURES  None      FINAL IMPRESSION  Plantar plate injury, left, initial encounter  (primary encounter diagnosis)  Crossover toe deformity of left foot  Foot pain, left    I did have an extended discussion with the patient regarding her clinical and radiologic findings.  I explained the disease process known as plantar plate injury and secondary pathologic dislocation of the toe.  I explained the challenge in treating these conservatively and restoring anatomic presentation.  Nonoperative care would include digital strapping, metatarsal padding, activity modification and analgesics when warranted.  Operative management would require restoration of the disrupted plantar plate.  This would be done through open incision potential metatarsal osteotomy direct plantar plate repair to the proximal phalanx potential concomitant repair of any type of flexion contracture of the digit.  The perioperative course was outlined.  Acute recovery of 6 weeks in protective surgical shoe.  Complications discussed included delayed wound healing, surgical site infection, scar formation, nerve injury, persistence of deformity, failure to achieve intended outcome, need for subsequent surgery.  Postoperative complications vascular  to include DVT PE were also discussed.  Surgery would be done under monitored anesthesia care with its inherent risks.  Patient endorses full understanding but does believe  to be adequately impacted to proceed with surgical correction of the deformity.  I will arrange to have this done outpatient.  She will be contacted by my surgical scheduler.    FOLLOW-UP   schedule surgical repair of plantar plate disruption with concomitant digital deformity second left      A majority of this note has been dictated with a voice recognition system. Please excuse any identified errors in spelling or syntax. Every effort has been made to appropriately edit and correct upon completion.      Electronically signed by: Astrid Divine. Ender Rorke,DPM, 01/23/2016 2:37 PM

## 2016-01-24 LAB — XR FOOT LEFT MINIMUM 3 VIEWS

## 2016-01-30 ENCOUNTER — Ambulatory Visit (HOSPITAL_BASED_OUTPATIENT_CLINIC_OR_DEPARTMENT_OTHER): Payer: Medicaid Other | Admitting: Ambulatory Care

## 2016-02-14 ENCOUNTER — Ambulatory Visit (HOSPITAL_BASED_OUTPATIENT_CLINIC_OR_DEPARTMENT_OTHER)
Admit: 2016-02-14 | Disposition: A | Discharge status: Home / Self Care | Payer: Self-pay | Source: Ambulatory Visit | Attending: Gastroenterology | Admitting: Gastroenterology

## 2016-02-14 LAB — URINE PREGNANCY TEST (POINT OF CARE): HCG QUALITATIVE URINE: NEGATIVE

## 2016-02-14 MED ORDER — PROPOFOL 200 MG/20ML IV EMUL
INTRAVENOUS | Status: AC
Start: 2016-02-14 — End: 2016-02-14
  Filled 2016-02-14: qty 20

## 2016-02-14 MED ORDER — LACTATED RINGERS IV SOLN
INTRAVENOUS | Status: DC
Start: 2016-02-14 — End: 2016-02-14
  Administered 2016-02-14: 11:00:00 via INTRAVENOUS

## 2016-02-14 NOTE — H&P (Signed)
Pre-Anesthetic Note    Pre op Diagnosis: screen    Planned Procedure: colon    Patient ID:  Amanda Cervantes isa 50 year old female  Height: 5' 4.17" (163 cm)  Weight: 90.7 kg (200 lb)        Previous Anesthetic History:   Past Surgical History:  No date: LAPAROSCOPY FULGURATION OVIDUCTS  No date: OB ANTEPARTUM CARE CESAREAN DLVR & POSTPARTUM  No date: TUBAL LIGATION  Patient denies complicationsof anesthesia.  Patient denies family complications of anesthesia.    Current Medications:      Current Outpatient Prescriptions:  chlorthalidone (HYGROTEN) 25 MG tablet Take 1 tablet by mouth daily Disp: 90 tablet Rfl: 12   lisinopril (PRINIVIL,ZESTRIL) 40 MG tablet Take 1 tablet by mouth daily Disp: 90 tablet Rfl: 12   ergocalciferol (VITAMIN D2) 50000 UNIT capsule Take 1 capsule by mouth once a week. Disp: 4 capsule Rfl: 2     Current Facility-Administered Medications:  lactated ringers infusion  Intravenous Continuous Junius Argyle Last Rate: 30 mL/hr at 02/14/16 1037       Hospital Meds     PRN Meds  Current Facility-Administered Medications   Medication Dose Route Frequency Last Dose           Allergies:   Review of Patient's Allergies indicates:   Penicillins                 Comment:Took skin 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 & was positive, never             actually took it    Smoking, Alcohol, Drugs:     Smoking status: Former Smoker  0.33 Packs/day  For 10.00 Years     Types: Cigarettes    Quit date: 10/15/2012    Smokeless tobacco: Not on file    Comment: quit 3 months ago on 10/15/12    Alcohol use Yes    Comment: minimal       Drug use: No       PMHx:  Past Medical History:  No date: HTN (hypertension)    PHYSICAL EXAMINATION:  BP 107/70  Pulse 54  Temp 98.6 F (37 C) (Temporal)  Resp 10  Ht 5' 4.17" (1.63 m)  Wt 90.7 kg (200 lb)  SpO2 99%  BMI 34.15 kg/m2    General: alert    Airway Classification:  Mallampati: Class I     A soft palate, fauces, uvula, anterior and posterior tonsil pillars are seen.   Mallampati Airway  Classification:   Dental Exam: nml  Oral Opening:  3cm  Full range of neck Motion:  Yes     Lungs: clear to auscultation bilaterally    Heart: normal and regular rate and rhythm    EKG/Echo:     Chest X-Ray:     Pertinent Labs:     Lab Results  Component Value Date   NA 140 11/21/2015   K 3.9 11/21/2015   CREAT 0.8 11/21/2015   GLUCOSER 99 11/21/2015   WBC 10.5 11/21/2015   HCT 36.4 11/21/2015   PLTA 174 11/21/2015        Other Findings: None    ASA Assessment: II   A Patient With Mild Systemic Disease  Emergency:  No     Plan:  GA NPO    Post-op Plan: PACU    Interpreter used: No       Pager:           Location manager.  For non-mandatory fields, the provider will enter relevant positive findings.

## 2016-02-14 NOTE — Progress Notes (Signed)
Anesthesia Post-Operative Evaluation Note    Patient: Amanda Cervantes    Procedure: colon          POST-OPERATIVE EVALUATION    Anesthesia Post Evaluation    Vitals signs in patient's normal range: Yes  Respiratory function stable; airway patent: Yes  Cardiovascular function stable: Yes  Hydration status stable: Yes  Mental status recovered; patient participates in evaluation and/or is at baseline: Yes  Pain control satisfactory: Yes  Nausea and vomiting control satisfactory: Yes

## 2016-02-14 NOTE — H&P (Signed)
GI Pre-procedure History and Physical Short Form  JOURNEI Amanda Cervantes is an50 year old female.    Chief Complaint: She is being scheduled for Colonoscopy    The history is provided by the patient. No language interpreter was used.           Active Problems:  Patient Active Problem List:     Obesity (BMI 30-39.9)     Essential hypertension, benign     Tobacco dependence     Pre-diabetes --???DM     Left knee pain     Left leg pain     Thumb pain     Headache disorder     Diverticulitis of large intestine without perforation or abscess without bleeding      History (Medical, Surgical, Social, Family):  Past Medical History:  No date: HTN (hypertension)  Past Surgical History:  No date: LAPAROSCOPY FULGURATION OVIDUCTS  No date: OB ANTEPARTUM CARE CESAREAN DLVR & POSTPARTUM  No date: TUBAL LIGATION    Social History  Social History   Marital status: Legally Separated  Spouse name: N/A    Years of education: N/A  Number of children: N/A     Occupational History  None on file     Social History Main Topics   Smoking status: Former Smoker  0.33 Packs/day  For 10.00 Years     Types: Cigarettes    Quit date: 10/15/2012    Smokeless tobacco: Not on file    Comment: quit 3 months ago on 10/15/12    Alcohol use Yes    Comment: minimal    Drug use: No    Sexual activity: Yes    Partners: Male    Birth control/ protection: Tubal Ligation     Other Topics Concern   None on file     Social History Narrative    she is originally from [place] ,[place].  She came to the [place] in 2005     In [place], she worked as a Musician.     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    She lives with boyfriend & daughter; no DV;      she has 2 children, age 51 y/o son in [place] & 65 y/o daughter lives with her       Family History    Heart Disease Father     Comment: died age 12 from MI; learned about HTN in 95's    Cancer - Other Mother     Comment: stomach cancer, died    GI Sister     Comment: hernia     Thyroid Sister        Allergies:   Review of Patient's Allergies indicates:   Penicillins                 Comment:Took skin 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 & was positive, never             actually took it    Medications:     Current Outpatient Prescriptions:  chlorthalidone (HYGROTEN) 25 MG tablet Take 1 tablet by mouth daily Disp: 90 tablet Rfl: 12   lisinopril (PRINIVIL,ZESTRIL) 40 MG tablet Take 1 tablet by mouth daily Disp: 90 tablet Rfl: 12   ergocalciferol (VITAMIN D2) 50000 UNIT capsule Take 1 capsule by mouth once a week. Disp: 4 capsule Rfl: 2     Current Facility-Administered Medications:  lactated ringers infusion  Intravenous Continuous Junius Argyle Last Rate: 30 mL/hr  at 02/14/16 1037       Vitals:  BP 107/70  Pulse 54  Temp 98.6 F (37 C) (Temporal)  Resp 10  Ht 5' 4.17" (1.63 m)  Wt 90.7 kg (200 lb)  SpO2 99%  BMI 34.15 kg/m2    Review of Systems   Constitutional: Negative for appetite change, fatigue and fever.   HENT: Negative.    Respiratory: Negative.    Cardiovascular: Negative.    Gastrointestinal: Positive for abdominal pain.   Neurological: Negative.        Physical Exam   Cardiovascular: Normal rate and regular rhythm.    Pulmonary/Chest: Effort normal and breath sounds normal.   Nursing note and vitals reviewed.         Airway Evaluation:  Gag reflex intact: Yes  Ability to open mouth wide:   Full  Dentures:  No  Loose teeth:  No  Neck range of motion  Full    Mallampati AirwayClassification: Class I     A soft palate, fauces, uvula, anterior and posterior tonsil pillars are seen.  Mallampati Airway Classification:     ASA Classification: ASA Class II (a patient with mild systemic disease)    Assessment:  Proceed with procedure

## 2016-02-14 NOTE — Discharge Instructions (Signed)
[facility] DISCHARGE INSTRUCTIONS     Quando voc for para casa  possvel que se sinta sonolento(a). Descanse bastante pelo resto do dia.   When you return home you may feel sleepy. Get plenty of rest for the remainder of the day.     Se voc tiver recebido algum sedative para o procedimento NO DIRIJA, NO  MANUSEIE MQUINAS NEM TOME NENHUMA DECISO IMPORTANTE durante o resto do dia.  If you received sedation for your procedure DO NOT DRIVE, OPERATE MACHINERY, OR MAKE IMPORTANT DECISIONS for the remainder of the day.     Depois ter feito uma COLONOSCOPIA  normal ter gases e sentir inchao abdominal, mas se voc tiver uma DOR ABDOMINAL SEVERA entre em contato com o seu mdico imediatamente.   It is normal after having a COLONOSCOPY to feel a little gassy and bloated, but if you develop SEVERE ABDOMINAL PAIN call your doctor immediately.      Depois ter feito uma COLONOSCOPIA  normal ver uma pequena quantidade de sangue aps as primeiras evacuaes, mas se voc vir uma QUANTIDADE GRANDE DE SANGUE VERMELHO VIVO, entre em contato com o seu mdico imediatamente.  It is normal after having a COLONOSCOPY to see a small amount of blood after your first few bowel movements, but, if you see a LARGE AMOUNT OF BRIGHT RED BLOOD, call your doctor immediately.     .     Se tiver feito uma bipsia ou Polipectomia voc ter que suspender a aspirina ou os medicamentos que contenham aspirina por {NUMBERS [id number] dias.                  If you had a biopsy or Polypectomy you will need to hold aspirin or medication containing aspirin for ___days.     Se voc tiver feito uma bipsia ou Polipectomia voc ter que suspender o medicamento coumadin por {NUMBERS [id number] dias.                                           If you had a biopsy or Polypectomy you will need to hold your coumadin for___days.     Se voc tiver feito uma bipsia ou Polipectomia voc ter que suspender  qualquer medicamento tais como Advil, Motrin, Naproxin, Ibuprofen etc por {NUMBERS 1-12:19994} dias.  If you had a biopsy or Polypectomy you will need to hold any medication such as Advil, Motrin, Naproxin, Ibuprofen etc for___days.     Entre em contato com o seu mdico se houver qualquer outro sintoma fora do normal.  Call you physician for any other unusual symptoms.     Se por qualquer razo voc no conseguir entrar em contato com o seu mdico v para a sala de Education officer, museum prxima.   If for any reason you are unable to reach your doctor go to the nearest Emergency Room.    Instrues Especficas:***  Specific Instructions:  Reviewed by preop nurse      Post Procedure Diagnosis:       - Preparation of the colon was fair.       - Two diminutive polyps in the sigmoid colon, removed with a jumbo cold        forceps. Resected and retrieved.       - One diminutive polyp in the transverse colon, removed with a jumbo cold  forceps. Resected and retrieved.       - Diverticulosis in the ascending colon.       - Hypertrophied anal papilla(e) and perianal rash found on perianal exam.  Complications:        No immediate complications.    Recommendation:       - Await pathology results.       - Return to my office at appointment to be scheduled.       - If the pathology report reveals adenomatous tissue, then repeat the        colonoscopy for surveillance in 5 years.  Junius Argyle, MD  02/14/2016 12:12:34 PM

## 2016-02-14 NOTE — PROVATION-GI (Signed)
[facility]  Patient Name: Amanda Cervantes  MRN: [id number]  Account Number: [id number]  Gender: Female  Age: 49  Date of Birth: April 24, 1965  Admit Type: Outpatient  Patient Location: [facility]  Note Status: Finalized  Referring MD:         Gavin Pound, MD  Procedure Date:       02/14/2016 11:37:43 AM  Procedure:            Colonoscopy  Endoscopist:          Junius Argyle, MD  Indications for Procedure:       Screening for colorectal malignant neoplasm  Medications:          Sedation Required Anesthesia Staff Assistance  Procedure:       Just prior to the procedure, an updated history and physical was done. I        obtained an informed consent from the patient reviewing the risk of the        procedure including (but not limited to) respiratory depression, perforation,        bleeding, discomfort, a possible need for surgery and unexpected reactions to        medications. The patient is aware that test has limitations and may not        detect significant lesions such as cancer or other potential diseases. The        patient was also informed that they might need a repeat colonoscopy earlier        than standard guidelines if there are changes in their symptoms or concerning        findings noted. A time out was performed with the entire procedure staff        present. The scope was passed under direct vision. Throughout the procedure,        the patient's blood pressure, pulse, and oxygen saturations were monitored        continuously. The [id number] was introduced through the anus and        advanced to the cecum, identified by appendiceal orifice and ileocecal valve.        The scope was then slowly withdrawn with confirmation of the noted findings.        The colonoscopy was performed without difficulty. The patient tolerated the        procedure well. The quality of the bowel preparation was fair. The total        duration of the procedure was 13 minutes. Scope withdrawal time was 10         minutes.  Findings:       Two polyps were found in the sigmoid colon. The polyps were diminutive in        size. These polyps were removed with a jumbo cold forceps. Resection and        retrieval were complete.       A diminutive polyp was found in the transverse colon. The polyp was removed        with a jumbo cold forceps. Resection and retrieval were complete.       A single diverticulum was found in the ascending colon.       The perianal exam findings include hypertrophied anal papilla(e) and a        perianal rash.  Post Procedure Diagnosis:       - Preparation of the colon was fair.       - Two diminutive polyps in the sigmoid colon,  removed with a jumbo cold        forceps. Resected and retrieved.       - One diminutive polyp in the transverse colon, removed with a jumbo cold        forceps. Resected and retrieved.       - Diverticulosis in the ascending colon.       - Hypertrophied anal papilla(e) and perianal rash found on perianal exam.  Complications:        No immediate complications.  Recommendation:       - Await pathology results.       - Return to my office at appointment to be scheduled.       - If the pathology report reveals adenomatous tissue, then repeat the        colonoscopy for surveillance in 5 years.  Junius Argyle, MD  02/14/2016 12:12:34 PM  This report has been signed electronically.  Number of Addenda: 0  Note Initiated On: 02/14/2016 11:37 AM

## 2016-02-15 ENCOUNTER — Encounter (HOSPITAL_BASED_OUTPATIENT_CLINIC_OR_DEPARTMENT_OTHER): Payer: Self-pay | Admitting: Internal Medicine

## 2016-02-15 DIAGNOSIS — Z860100 Personal history of colon polyps, unspecified: Secondary | ICD-10-CM | POA: Insufficient documentation

## 2016-02-17 LAB — SURGICAL PATH SPECIMEN

## 2016-02-19 NOTE — Discharge Instructions (Signed)
[facility] PS-OPERATRIAS DA PODOLOGIA  PODIATRY POST-OPERATIVE INSTRUCTIONS  1. Compre o/s medicamento/s receitado/s imediatamente e siga as instrues de utilizao.   Have prescriptions(s) filled immediately and follow the instructions for use.   2. Descanse na cama com o seu p/s elevado/s em dois travesseiros acima do nvel do seu corao o tempo todo.   Rest in bed with your foot (feet) elevated on two pillows above the level of your heart at all times.   3. Mantenha seus curativos lipos e secos. Voc no pode tomar banho de [employer]. Voc pode apenas se lavar com uma esponja. Se seu p molhar, por qualquer razo, entre em contato com seu mdico imediatamente. No tire nenhum dos curativos.  Keep your bandages clean and dry. You may not take a shower or a bath. You may sponge bathe only. If your foot gets wet for any reason, call your doctor immediately. Do not remove any of the bandages.  4. Coloque uma bolsa de gelo em cima de seu p/tornozelo ou atrs de seu joelho durante 30 minutos a cada hora at [employer] a hora de voc ir dormir  noite e acordar de manh durante os prximos 3 dias.   Apply an ice pack to the top of your foot/ankle or behind your knee for 30 minutes of each hour until you go to bed tonight and waking for the next 3 days.   5. SEMPRE use seu sapato ps-operatrio quando ficar em p ou andar, mesmo se por uma distncia curta.   ALWAYS wear your Post-Operative shoe when standing or walking, even for a short distance.   6. No tome nenhuma bebida alcolica enquanto estiver tomando os medicamentos.  Do not drink any alcoholic beverages while taking pain medications.  7. No dirija at seu mdico dizer que voc pode  Do not drive until your doctor says you may do so.  8. Ligue para seu mdico IMEDIATAMENTE se:  Seus medicamentos no estiverem ajudando.   [place] persistente vazando pelo curativo.   Voc cair ou machucar seu p e/ou o tornozelo.   Sua temperatura for de 101,4 F  (38.5 C) ou superior.   Seu curativo ficar molhado, sujo, ou estiver caindo.   Voc tem um pino que parece estar saindo.   Voc tem uma mudana no seu apetite ou nos hbitos intestinais.  Call your doctor IMMEDIATELY if:  Your pain medications seem to be ineffective.   There is continued bleeding through the bandage.   You fall or injure your foot and or ankle.   Your temperature is 101.4 F or greater.   Your bandage becomes wet, soiled, or is falling off.   You have a pin that seems to be working itself out.   You have a change in appetite or bowel habits.  9. Voc pode andar apenas como indicado:  - Sustentao parcial do peso no calcanhar: Heel weight bearing  10. Um mdico est de planto 24 horas por dia para responder a todas as perguntas ou preocupaes e lidar com qualquer emergncia que possa acontecer:   [place]: [place]: [phone]  A doctor is on call 24 hours per day to answer any questions or concerns and handle any emergency which may arise:   [facility] Phone #: [phone]   Podiatry Pager #: [phone]    13. Favor ligar para [phone] para marcar ou confirmar a sua consulta ps-operatria com o/a Dr/a. Theodoulou   Data: {MONTHS (PORTUGUESE):11387} {Numbers; 1-31 (  date):31396}, 20***    Horrio: ***    Local: *** Surgical Specialties    Informao Adicional:   Requisies de refis de receitas para medicaes narcticas precisam no minimo de 48 horas de antecedncia, e uma receita de papel deve ser entregue pessoalmente durante o horrio comercial nos dias teis para solicitao de refil ser concedida. Refis no podem ser feitos nos finais de semana.  Additional Information:   Narcotic pain medication prescription refill requests require 48 hours advance notice and a paper prescription must be picked up in person during regular weekday business hours if the refill request is granted. Refills cannot be given over the weekend.

## 2016-02-24 ENCOUNTER — Ambulatory Visit (HOSPITAL_BASED_OUTPATIENT_CLINIC_OR_DEPARTMENT_OTHER): Admit: 2016-02-24 | Payer: Self-pay | Source: Ambulatory Visit | Admitting: Podiatrist

## 2016-03-02 ENCOUNTER — Ambulatory Visit (HOSPITAL_BASED_OUTPATIENT_CLINIC_OR_DEPARTMENT_OTHER)
Admit: 2016-03-02 | Discharge: 2016-03-02 | Disposition: A | Discharge status: Home / Self Care | Payer: Self-pay | Source: Ambulatory Visit | Attending: Podiatrist | Admitting: Podiatrist

## 2016-03-05 ENCOUNTER — Ambulatory Visit: Payer: Self-pay | Admitting: Internal Medicine

## 2016-03-05 DIAGNOSIS — Z0000 Encounter for general adult medical examination without abnormal findings: Principal | ICD-10-CM

## 2016-03-05 LAB — MA SCREENING MAMMO BILATERAL DIGITAL WITH DBT & CAD

## 2016-03-05 NOTE — Addendum Note (Signed)
Addended by: Evette Georges on: 03/05/2016 01:08 PM     Modules accepted: Orders

## 2016-03-20 ENCOUNTER — Encounter (HOSPITAL_BASED_OUTPATIENT_CLINIC_OR_DEPARTMENT_OTHER): Payer: Self-pay | Admitting: Gastroenterology

## 2016-03-23 ENCOUNTER — Encounter (HOSPITAL_BASED_OUTPATIENT_CLINIC_OR_DEPARTMENT_OTHER): Payer: Self-pay | Admitting: Podiatrist

## 2016-03-23 ENCOUNTER — Ambulatory Visit (HOSPITAL_BASED_OUTPATIENT_CLINIC_OR_DEPARTMENT_OTHER)
Admit: 2016-03-23 | Disposition: A | Discharge status: Home / Self Care | Payer: Self-pay | Source: Ambulatory Visit | Attending: Podiatrist | Admitting: Podiatrist

## 2016-03-23 DIAGNOSIS — M21962 Unspecified acquired deformity of left lower leg: Secondary | ICD-10-CM | POA: Diagnosis present

## 2016-03-23 DIAGNOSIS — S99922A Unspecified injury of left foot, initial encounter: Principal | ICD-10-CM | POA: Diagnosis present

## 2016-03-23 LAB — XR FOOT LEFT MINIMUM 3 VIEWS

## 2016-03-23 LAB — URINE PREGNANCY TEST (POINT OF CARE): HCG QUALITATIVE URINE: NEGATIVE

## 2016-03-23 MED ORDER — BUPIVACAINE HCL (PF) 0.5 % IJ SOLN
INTRAMUSCULAR | Status: AC
Start: 2016-03-23 — End: 2016-03-23
  Administered 2016-03-23: 8 mL via INTRAMUSCULAR
  Administered 2016-03-23: 10 mL via INTRAMUSCULAR
  Filled 2016-03-23: qty 30

## 2016-03-23 MED ORDER — CLINDAMYCIN PHOSPHATE IN D5W 600 MG/50ML IV SOLN
600.00 mg | Freq: Once | INTRAVENOUS | Status: AC
Start: 2016-03-23 — End: 2016-03-23
  Administered 2016-03-23: 600 mg via INTRAVENOUS

## 2016-03-23 MED ORDER — OXYCODONE-ACETAMINOPHEN 5-325 MG PO TABS
1.0000 | ORAL_TABLET | ORAL | Status: DC | PRN
Start: 2016-03-23 — End: 2016-03-23

## 2016-03-23 MED ORDER — CLINDAMYCIN PHOSPHATE IN D5W 600 MG/50ML IV SOLN
INTRAVENOUS | Status: AC
Start: 2016-03-23 — End: 2016-03-23
  Administered 2016-03-23: 600 mg via INTRAVENOUS
  Filled 2016-03-23: qty 50

## 2016-03-23 MED ORDER — ACETAMINOPHEN 325 MG PO TABS
325.00 mg | ORAL_TABLET | Freq: Four times a day (QID) | ORAL | 0 refills | Status: AC | PRN
Start: 2016-03-23 — End: 2016-03-30

## 2016-03-23 MED ORDER — LIDOCAINE HCL 2 % EX GEL
CUTANEOUS | Status: AC
Start: 2016-03-23 — End: 2016-03-23
  Filled 2016-03-23: qty 5

## 2016-03-23 MED ORDER — FENTANYL CITRATE 0.05 MG/ML IJ SOLN
INTRAMUSCULAR | Status: AC
Start: 2016-03-23 — End: 2016-03-23
  Filled 2016-03-23: qty 2

## 2016-03-23 MED ORDER — SENNA 8.6 MG PO TABS
8.60 mg | ORAL_TABLET | Freq: Two times a day (BID) | ORAL | 0 refills | Status: AC
Start: 2016-03-23 — End: 2016-03-30

## 2016-03-23 MED ORDER — LIDOCAINE HCL (PF) 2 % IJ SOLN
INTRAMUSCULAR | Status: AC
Start: 2016-03-23 — End: 2016-03-23
  Filled 2016-03-23: qty 5

## 2016-03-23 MED ORDER — PROPOFOL 200 MG/20ML IV EMUL
INTRAVENOUS | Status: AC
Start: 2016-03-23 — End: 2016-03-23
  Filled 2016-03-23: qty 20

## 2016-03-23 MED ORDER — HYDROMORPHONE HCL 1 MG/ML IJ SOLN
0.2500 mg | INTRAMUSCULAR | Status: DC | PRN
Start: 2016-03-23 — End: 2016-03-23

## 2016-03-23 MED ORDER — LIDOCAINE HCL 1 % IJ SOLN
INTRAMUSCULAR | Status: DC
Start: 2016-03-23 — End: 2016-03-23
  Filled 2016-03-23: qty 20

## 2016-03-23 MED ORDER — ACETAMINOPHEN 325 MG PO TABS: 325 mg | tablet | Freq: Four times a day (QID) | ORAL | 0 refills | 0 days | Status: AC | PRN

## 2016-03-23 MED ORDER — OXYCODONE HCL 5 MG PO TABS
5.00 mg | ORAL_TABLET | ORAL | 0 refills | Status: AC | PRN
Start: 2016-03-23 — End: 2016-03-30

## 2016-03-23 MED ORDER — KETOROLAC TROMETHAMINE 30 MG/ML INJ
Status: AC
Start: 2016-03-23 — End: 2016-03-23
  Filled 2016-03-23: qty 1

## 2016-03-23 MED ORDER — MIDAZOLAM HCL 2 MG/2ML IJ SOLN
INTRAMUSCULAR | Status: AC
Start: 2016-03-23 — End: 2016-03-23
  Filled 2016-03-23: qty 2

## 2016-03-23 MED ORDER — LACTATED RINGERS IV SOLN
INTRAVENOUS | Status: DC
Start: 2016-03-23 — End: 2016-03-23

## 2016-03-23 MED ORDER — OXYCODONE HCL 5 MG PO TABS: 5 mg | tablet | ORAL | 0 refills | 0 days | Status: AC | PRN

## 2016-03-23 MED ORDER — SENNA 8.6 MG PO TABS: 9 mg | tablet | Freq: Two times a day (BID) | ORAL | 0 refills | 0 days | Status: AC

## 2016-03-23 MED ORDER — ONDANSETRON HCL 4 MG/2ML IJ SOLN
4.0000 mg | Freq: Once | INTRAMUSCULAR | Status: DC | PRN
Start: 2016-03-23 — End: 2016-03-23

## 2016-03-23 NOTE — Op Note (Signed)
Date of Operation: 03/23/2016    SURGEON:  Rogelia Mire, DPM.    ASSISTANT:  Carney Corners, PGY3.    PREOPERATIVE DIAGNOSIS:  Plantar plate injury, left foot.    POSTOPERATIVE DIAGNOSIS:  Plantar plate injury, left foot.    PROCEDURE:  Plantar plate repair, left foot; Weil osteotomy, second metatarsal, left foot.    PATHOLOGY:  None.    TYPE OF ANESTHESIA:  MAC injection, preoperatively 10 mL of 0.5% Marcaine plain, intraoperatively 8 mL of 0.5% Marcaine plain.    HEMOSTASIS:  Pneumatic ankle tourniquet for 51 minutes at 250 mmHg.    ESTIMATED BLOOD LOSS:  Less than 1 mL.    MATERIALS AND IMPLANTS:  Arthrex 2.0 mm Quickfix snap-off screws (11 mm and 13 mm), 2-0 FiberWire, 3-0 Vicryl, 4-0 Vicryl, 4-0 Prolene.    COMPLICATIONS:  None.    INDICATIONS FOR PROCEDURE:  This is a 51 year old female who was previously seen on an outpatient basis with.  She relates over the past year she has been suffering from pain to her left forefoot. The advancement of the disease to the plate warrants surgery as a viable treatment. Further she feels as though she has exhausted all conservative options and would like to proceed with surgical intervention.  The procedure was described in detail to the patient.  All risks and benefits were described.  No guarantees were made at this time.  The consent form was signed and placed in the patient's chart.    DESCRIPTION OF PROCEDURE:  Under mild sedation, the patient was brought to the operating room and placed on the operating table in supine position.  A timeout was performed, and the correct patient, procedure, and site of procedure were identified, and all in the room were in agreement.  A well-padded pneumatic ankle tourniquet was applied to the patient's left ankle.  After induction of anesthesia by the anesthesia team, the patient received a local anesthetic injection consisting of 10 mL of 0.5% Marcaine plain.  The foot was then scrubbed, prepped, and draped in the usual aseptic  manner.  An Esmarch bandage was utilized to exsanguinate the left lower extremity and pneumatic ankle tourniquet was inflated to a pressure of 250 mmHg.  Attention was then directed to the dorsal aspect of the second metatarsal head.  A linear incision going from the second metatarsal neck to the head of the second proximal phalanx was performed with a #15 blade. The patient did receive injection consisting of 8 mL of 0.5% Marcaine plain. Blunt dissection was performed down to the level of the extensor tendon and it was identified.  An incision was performed just lateral to the extensor tendon to be able to retract this laterally.  The extensor tendon remained protected for the duration of the case.  Once this was complete, a linear incision was performed through the dorsal capsule layer over the second metatarsal head to the base of the proximal phalanx.  This capsular layer was reflected off the head of the second metatarsal and base of the proximal phalanx to allow for adequate visualization.  A #15 blade was utilized to release the collateral ligament insertion to the base of the proximal phalanx on the medial and lateral aspect of the bone.  Once this was performed, the plantar plate was inspected.  It did appear to be very attenuated and atrophic.  Therefore, it was decided to proceed with the plantar plate repair.  Utilizing the sagittal saw, the Weil with a wafer osteotomy was performed.  The first cut kept the plantar cortex of the second metatarsal bone intact. A second cut was performed about 1 mm plantar the first cut.  This cut was complete in the plantar aspect of the metatarsal head and the first cut was finished.  A small wafer was removed.  The metatarsal head pushing device was then obtained and the metatarsal head was pushed back into place.  This was temporarily fixated with a guidewire for the distractor.  A second guidewire for the distractor was inserted into the base of the proximal phalanx  and the joint distractor was applied and the joint was distracted.  The plantar plate was released off of its insertion to the proximal phalanx.  Additionally, it appeared to be adhered to the flexor tendon and there is an abundant amount of scar tissue appreciated.  This was released off of the plantar plate, as well as excised.  Once the plantar plate was fully released, the Arthrex Viper device was used to pass FiberWire through the medial and lateral aspect of the plantar plate.  Once this was complete, the joint distractor was removed.  A 0.062 inch K-wire was then drilled from dorsal to plantar just distal to the articular surface of the proximal phalanx on the medial and lateral aspect of the proximal phalanx.  These were then dilated to allow for adequate passage of the suture material.  Utilizing the tunnel cannula and the nitinol suture passer, sutures from the plantar plate was passed through the proximal phalanx at the medial side going to the medial aspect and the lateral side going to the lateral aspect of the proximal phalanx.  The temporary guide pins were then removed, and the attention was directed back to the metatarsal head.  Once the metatarsal head was placed in its proper corrected position, a 2.0 x 11 mm Arthrex QuickFix screw was inserted but not fully engaged.  A 0.062 inch K-wire was inserted just distally to the first screw in the metatarsal head.  Then, an Arthrex 2.0 x 13 mm QuickFix screw was inserted to 2 finger tightness.  Attention was directed back to the more proximal screw and this was then tightened to 2 finger tightness.  Dorsal overhang on the proximal phalanx was resected with a rongeur.  Attention was then directed to the suture for the plantar plate repair.  With the second digit held in a slightly plantar flexed position, the suture for the plantar plate was tied with a surgeon's knot.  Once this was complete, the second digit was sitting in an excellent anatomic position.   The operative site was irrigated with copious amounts of saline.  The capsular layer was closed with 3-0 Vicryl.  The subcutaneous layer was closed with 4-0 Vicryl, and the skin was closed with 4-0 Prolene.    The foot was then cleansed with moist saline gauze and dried.  Xeroform was applied to the incision site.  The foot was dressed with 4 x 4 gauze, Kerlix, and an external Ace bandage.  Pneumatic ankle tourniquet was deflated after a period of 51 minutes with good capillary refill time appreciated to the distal digits.  The patient was awoken from anesthesia and transported to the postanesthesia care unit with vital signs stable and vascular status intact.  She will be monitored for a period of time before being discharged.    Please note that Dr. Rogelia Mire was scrubbed and present for the case in its duration.    ___________________________  Reviewed and  Electronically Signed By: Shelbie Proctor DPM  Sig Date: 03/26/2016  Sig Time: 12:10:44  Dictated By: Carney Corners DPM  Dict Date: 03/23/2016 Dict Time: 05 26 PM    Dictation Date and Time:03/23/2016 17:26:24  Transcription Date and Time:03/23/2016 19:01:58  eScription Dictation id: [id number] Confirmation # :[id number]

## 2016-03-23 NOTE — H&P (Signed)
Pre-Anesthetic Note    Pre op Diagnosis: toe deformity    Planned Procedure: correction of plantar plate with toe deformity, postention metatarsal osteotomy, left    Patient ID:  Shaune Leeks is a 51 year old female              Previous Anesthetic History:   Past Surgical History:  No date: LAPAROSCOPY FULGURATION OVIDUCTS  No date: OB ANTEPARTUM CARE CESAREAN DLVR & POSTPARTUM  No date: TUBAL LIGATION  Patient denies complicationsof anesthesia.  Patient denies family complications of anesthesia.    Current Medications:      Current Outpatient Prescriptions:  chlorthalidone (HYGROTEN) 25 MG tablet Take 1 tablet by mouth daily Disp: 90 tablet Rfl: 12   lisinopril (PRINIVIL,ZESTRIL) 40 MG tablet Take 1 tablet by mouth daily Disp: 90 tablet Rfl: 12   ergocalciferol (VITAMIN D2) 50000 UNIT capsule Take 1 capsule by mouth once a week. Disp: 4 capsule Rfl: 2     No current facility-administered medications for this encounter.     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   Mallampati Airway Classification:   Dental Exam: intact  Oral Opening:  3cm  Full range of neck Motion:  Yes     Lungs: clear to auscultation bilaterally    Heart: normal and regular rate and rhythm    EKG/Echo: Normal sinus rhythm with sinus arrhythmia    Inferior infarct , age undetermined     Abnormal ECG      Chest X-Ray: Impression:    No acute cardiopulmonary disease.       Pertinent Labs:     Lab Results  Component Value Date   NA 140 11/21/2015   K 3.9 11/21/2015   CREAT 0.8 11/21/2015   GLUCOSER 99 11/21/2015   WBC 10.5 11/21/2015   HCT 36.4 11/21/2015   PLTA 174 11/21/2015        Other Findings: None    ASA Assessment: III  A Patient With Sever Systemic Disease That Limits but is not Incapacitating  Emergency:  No     Plan:  MAC, may need nasal trumpet or oral airway due to likely snoring.    Post-op Plan: PACU    Interpreter used: Yes    Mina Marble, MD       Alanson Aly = Mandatory Field.  For non-mandatory fields, the provider will enter relevant positive findings.  Review of Systems   Respiratory:        Snoring, tiredness during day, observed apnea, HTN, BMI, age 70. At least 4 on STOP BANG. likley OSA.

## 2016-03-23 NOTE — Brief Op Note (Signed)
Brief Procedure or Operative Note    Patient name: Amanda Cervantes  DOB: Sep 11, 1965   51 year old  MRN: [id number]  [employer] #: [id number]    Surgeon: Dr. Rogelia Mire    Assistant:  Carney Corners PGY-3    Pre-Procedure Diagnosis:   Plantar plate injury, left foot    Post-Procedure Diagnosis:  Same    Procedure:   Plantar plate repair, left foot  Weil osteotomy 2nd metatarsal, left foot    Pathology:  None    Type of Anesthesia:   MAC = Monitored Anesthesia Care (Sedation)    Injections:  Pre-operatively: 10 cc 0.5% Marcaine plain  Intra-operatively: 8 cc 0.5% Marcaine plain    Hemostasis:  Ankle Tourniquet for 67mntues at 2568mg    Estimated Blood Loss:   < 1 cc    Materials/Implants:  Arthrex 2.0 snap-off screws (11 mm and 13 mm)  2-0 Fiberwire  3- Vicryl  4-0 Vicryl  4-0 Prolene    Complications  None    JaCarney CornersDPM, 03/23/2016, 10:07 AM    PGY-3  Pager : [phone]  In accordance with the opiate bill enacted on 05/31/14, the patient's condition is such that a non-opiate medication was not appropriate and:  1. The treatment condition associated with the prescribing of an opiate/opioid medication is due to the addiction treatment, acute condition, chronic pain, cancer, or palliative care.  2. I have discussed with the parent or guardian of the minor the risks associated with opiate use and the reasons why the prescription is necessary.  3. I have evaluated the patients current condition, risk factors, history of substance abuse, if any, and current medications  4. I have informed the patient that the prescribed medication is an appropriate course of treatment based on the medical need of the patient.    5. I have consulted with the patient regarding the quantity of the opiate prescribed and informed the patients of her right to fill the prescription in a lesser quantity.  6. I have expressly informed the patient of the risks associated with the opiate prescribed.  7. I have checked the Prescription Monitoring  Program for this patient.

## 2016-03-23 NOTE — Progress Notes (Signed)
Anesthesia Post-Operative Evaluation Note    Patient: Amanda Cervantes    Procedure: correction of plantar plate, metatarsal osteotomy, left          POST-OPERATIVE EVALUATION    Anesthesia Post Evaluation    Vitals signs in patient's normal range: Yes  Respiratory function stable; airway patent: Yes  Cardiovascular function stable: Yes  Hydration status stable: Yes  Mental status recovered; patient participates in evaluation and/or is at baseline: Yes  Pain control satisfactory: Yes  Nausea and vomiting control satisfactory: Yes

## 2016-03-23 NOTE — Discharge Instructions (Signed)
INSTRUES PS-OPERATRIAS DA PODOLOGIA  PODIATRY POST-OPERATIVE INSTRUCTIONS  1. Compre o/s medicamento/s receitado/s imediatamente e siga as instrues de utilizao.   Have prescriptions(s) filled immediately and follow the instructions for use.   2. Descanse na cama com o seu p/s elevado/s em dois travesseiros acima do nvel do seu corao o tempo todo.   Rest in bed with your foot (feet) elevated on two pillows above the level of your heart at all times.   3. Mantenha seus curativos lipos e secos. Voc no pode tomar banho de [employer]. Voc pode apenas se lavar com uma esponja. Se seu p molhar, por qualquer razo, entre em contato com seu mdico imediatamente. No tire nenhum dos curativos.  Keep your bandages clean and dry. You may not take a shower or a bath. You may sponge bathe only. If your foot gets wet for any reason, call your doctor immediately. Do not remove any of the bandages.  4. Coloque uma bolsa de gelo em cima de seu p/tornozelo ou atrs de seu joelho durante 30 minutos a cada hora at [employer] a hora de voc ir dormir  noite e acordar de manh durante os prximos 3 dias.   Apply an ice pack to the top of your foot/ankle or behind your knee for 30 minutes of each hour until you go to bed tonight and waking for the next 3 days.   5. SEMPRE use seu sapato ps-operatrio quando ficar em p ou andar, mesmo se por uma distncia curta.   ALWAYS wear your Post-Operative shoe when standing or walking, even for a short distance.   6. Se uma tala foi providenciada, no a remova. Se estiver muito apertado ou estiver interferindo com a sua sensao (dormncia, formigamento) ou Armed forces training and education officer (dor ou alteraes na cor na ponta dos ps), entre em contato com o seu mdico imediatamente.   If you have been given a splint, do not remove it. If it feels too tight or interferes with your sensation (numbness, tingling) or circulation (pain or color changes in toes), call your doctor immediately.   7. Se  muletas foram providenciadas, tenha muito cuidado quando se levantar, e sempre tenha algum disponvel para ajud-lo/a.   If you have been given crutches, be very cautious when you get up, and always have someone available to assist you.  8. No tome nenhuma bebida alcolica enquanto estiver tomando os medicamentos.  Do not drink any alcoholic beverages while taking pain medications.  9. No dirija at seu mdico dizer que voc pode  Do not drive until your doctor says you may do so.  10. Ligue para seu mdico IMEDIATAMENTE se:  Seus medicamentos no estiverem ajudando.   [place] persistente vazando pelo curativo.   Voc cair ou machucar seu p e/ou o tornozelo.   Sua temperatura for de 101,4 F (38.5 C) ou superior.   Seu curativo ficar molhado, sujo, ou estiver caindo.   Voc tem um pino que parece estar saindo.   Voc tem uma mudana no seu apetite ou nos hbitos intestinais.  Call your doctor IMMEDIATELY if:  Your pain medications seem to be ineffective.   There is continued bleeding through the bandage.   You fall or injure your foot and or ankle.   Your temperature is 101.4 F or greater.   Your bandage becomes wet, soiled, or is falling off.   You have a pin that seems to be working itself out.   You have a change in appetite or bowel  habits.  11. Voc pode andar apenas como indicado:   - Sustentao do peso como tolerado: Weight bearing as tolerated  12. Um mdico est de planto 24 horas por dia para responder a todas as perguntas ou preocupaes e lidar com qualquer emergncia que possa acontecer:   [place]: [place]: [phone]  A doctor is on call 24 hours per day to answer any questions or concerns and handle any emergency which may arise:   [facility] Phone #: [phone]   Podiatry Pager #: [phone]    13. Favor ligar para [phone] para marcar ou confirmar a sua consulta ps-operatria com o/a Dr/a. Theodoulou   Data: [place] (January)  12, 2018    [place]: 10:45 am    Local: [facility]    Informao Adicional:   Requisies de refis de receitas para medicaes narcticas precisam no minimo de 48 horas de antecedncia, e uma receita de papel deve ser entregue pessoalmente durante o horrio comercial nos dias teis para solicitao de refil ser concedida. Refis no podem ser feitos nos finais de semana.  Additional Information:   Narcotic pain medication prescription refill requests require 48 hours advance notice and a paper prescription must be picked up in person during regular weekday business hours if the refill request is granted. Refills cannot be given over the weekend.

## 2016-03-23 NOTE — H&P (Signed)
- PODIATRIC SURGERY  H&P NOTE -      HPI:  This is a 51 year old  female who presents to the pre-operative surgical suite in preparation for her repair of her plantar plate injury on the left 2nd digit. The patient relates she has been suffering from the pain for over 1 year. The patient relates she has exhausted all conservative options and is ready to pursue surgical intervention. Patient confirms NPO status.    Review of Systems:  She denies any nausea, vomiting, fevers, chills, shortness of breath or chest pain.     Past Medical History:   No date: HTN (hypertension)    Past Surgical History:   Past Surgical History:  No date: LAPAROSCOPY FULGURATION OVIDUCTS  No date: OB ANTEPARTUM CARE CESAREAN DLVR & POSTPARTUM  No date: TUBAL LIGATION    Medications prior to Admission:   chlorthalidone (HYGROTEN) 25 MG tablet Take 1 tablet by mouth daily   lisinopril (PRINIVIL,ZESTRIL) 40 MG tablet Take 1 tablet by mouth daily   ergocalciferol (VITAMIN D2) 50000 UNIT capsule Take 1 capsule by mouth once a week.     No current facility-administered medications on file prior to encounter.     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, never             actually took it    Family History:     Family History    Heart Disease Father     Comment: died age 36 from MI; learned about HTN in 9's    Cancer - Other Mother     Comment: stomach cancer, died    GI Sister     Comment: hernia    Thyroid Sister        Tobacco Use:     Smoking status: Former Smoker 0.33 Packs/day For 10.00 Years   Types: Cigarettes   Quit date: 10/15/2012   Smokeless tobacco: Not on file   Comment: quit 3 months ago on 10/15/12       Alcohol:     Alcohol use Yes   Comment: minimal        Physical Exam:    General Appearance:  Alert, cooperative, no distress, appears stated age   Lungs:   Clear to auscultation bilaterally,  respirations unlabored   Heart:  Regular rate and rhythm, S1, S2 normal, no murmur, rub or gallop   Abdomen:   Soft, non-tender, bowel sounds active all four quadrants   Left Lower Extremities: Vascular: DP and PT pulses palpable. CFT brisk to distal digits. Skin temperature gradient within normal limits  Neurologic: Light touch sensation intact  Dermatologic: No open lesions appreciated.   Orthopedic:Pain with palpation of the 2nd metatarsal head. Dorsiflexion of the proximal phalanx on the metatarsal with a slight cross over deformity appreciated.            Assessment/Plan: This is a 51 year old female scheduled for repair of her plantar plate injury on Left foot with the possibility of an osteotomy on the 2nd metatarsal. The patient has exhausted conservative treatment and would like to pursue a surgical interventions. The surgical procedure was discussed in detail with the patient. All risks, complications, benefits and alternatives were discussed with patient in detail. All questions and concerns were addressed.No  guarantees were given. Post-operative course was also discussed with patient. The patient understands they are supposed to keep the surgical dressing clean, dry and intact until the follow-up appointment. The consent form was reviewed and signed with the assistance of a [place] interpretor.      Carney Corners PGY-3   [phone]

## 2016-03-30 ENCOUNTER — Ambulatory Visit (HOSPITAL_BASED_OUTPATIENT_CLINIC_OR_DEPARTMENT_OTHER): Payer: Medicaid Other | Admitting: Podiatrist

## 2016-03-30 DIAGNOSIS — S99922A Unspecified injury of left foot, initial encounter: Secondary | ICD-10-CM

## 2016-03-30 DIAGNOSIS — Z9889 Other specified postprocedural states: Principal | ICD-10-CM

## 2016-04-03 NOTE — Progress Notes (Signed)
Post operative note    CHIEF COMPLAINT    Post surgical visit #1    HPI    Amanda Cervantes is a 51 year old female who presents status repair of plantar plate injury second metatarsal phalangeal joint left.  Overall she is doing quite well.  She does not comment on adverse process.  She does not endorse significant residual postoperative pain.  03/30/2016 10:45 AM.    REVIEW OF SYSTEMS    See HPI for further details. Review of systems otherwise negative.       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 & was positive, never             actually took it    SOCIAL HISTORY  Social History    Marital status: Legally Separated   Spouse name:                       Years of education:                 Number of children:               Social History Main Topics    Smoking status: Former Smoker                                                                Packs/day: 0.33      Years: 10.00          Types: Cigarettes       Quit date: 10/15/2012    Comment: quit 3 months ago on 10/15/12    Alcohol use: Yes                Comment: minimal    Drug use: No              Sexual activity: Yes               Partners with: Female       Birth control/protection: Tubal Ligation    Social History Narrative    she is originally from [place] ,[place].  She came to the [place] in 2005     In [place], she worked as a Musician.     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  Hemodynamically stable.  Constitutional:  Well-developed, Well-nourished, No acute distress, Non-toxic appearance.  Cardiovascular: Exhibits a warm and well-perfused foot right.  Distal pulses are palpable.  Capillary filling time is immediate.  There is no evidence of peripheral pitting edema.  Skin temperature and gradient is normal.  There is no calf tenderness.  Musculoskeletal: There is anatomic position of the second toe left.  There is low-grade swelling.  Skin: Surgical incision to the dorsum of second metatarsal phalangeal joint left is well approximated with suture.  There is no active bleeding.  There is no bruising.  There is no clinical signs of infection.  Lymphatic:  No lymphadenopathy noted.   Neurological:  Alert & oriented x 3.   Psychiatric:  Affect normal, Judgment normal, Mood normal.        RADIOLOGY  None    LABS  None    PROCEDURES  None    FINAL IMPRESSION  Postoperative state  (primary encounter diagnosis)  Plantar plate injury, left, initial encounter    Patient appears to be doing quite well postoperatively.  I have redressed the surgical site.  She will continue to function in the surgical sandal with principal weightbearing on the heel.  On next visit we will plan suture removal.    FOLLOW-UP  Follow up in 1 week(s)    A majority of this note has been dictated with a voice recognition system. Please excuse any identified errors in spelling or syntax. Every effort has been made to appropriately edit and correct upon completion.      Electronically signed by: Astrid Divine. Rei Medlen,DPM, 04/03/2016 5:28 AM

## 2016-04-09 ENCOUNTER — Ambulatory Visit (HOSPITAL_BASED_OUTPATIENT_CLINIC_OR_DEPARTMENT_OTHER): Payer: Medicaid Other | Admitting: Podiatrist

## 2016-04-09 ENCOUNTER — Encounter (HOSPITAL_BASED_OUTPATIENT_CLINIC_OR_DEPARTMENT_OTHER): Payer: Self-pay | Admitting: Podiatrist

## 2016-04-09 DIAGNOSIS — S99922D Unspecified injury of left foot, subsequent encounter: Secondary | ICD-10-CM

## 2016-04-09 DIAGNOSIS — Z4802 Encounter for removal of sutures: Principal | ICD-10-CM

## 2016-04-09 DIAGNOSIS — Z9889 Other specified postprocedural states: Secondary | ICD-10-CM

## 2016-04-09 NOTE — Progress Notes (Signed)
Post operative note    CHIEF COMPLAINT    Post surgical visit #2    HPI    Amanda Cervantes is a 51 year old female who presents for continued follow-up status repair of plantar plate injury second metatarsal phalangeal joint left.  She is doing exceedingly well.  She does not endorse any residual surgical pain.  She does not comment on adverse event.  04/09/2016  1:00 PM.    REVIEW OF SYSTEMS    See HPI for further details. Review of systems otherwise negative.       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 & was positive, never             actually took it    SOCIAL HISTORY  Social History    Marital status: Legally Separated   Spouse name:                       Years of education:                 Number of children:               Social History Main Topics    Smoking status: Former Smoker                                                                Packs/day: 0.33      Years: 10.00          Types: Cigarettes       Quit date: 10/15/2012    Comment: quit 3 months ago on 10/15/12    Alcohol use: Yes                Comment: minimal    Drug use: No              Sexual activity: Yes               Partners with: Female       Birth control/protection: Tubal Ligation    Social History Narrative    she is originally from [place] ,[place].  She came to the [place] in 2005     In [place], she worked as a Musician.     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  Afebrile and hemodynamically stable.  Constitutional:  Well-developed, Well-nourished, No acute distress, Non-toxic appearance.   Cardiovascular: Exhibits a warm and well-perfused foot left.  Distal pulses are palpable.  Capillary filling time is immediate.  There is no pitting edema.  There is no calf tenderness.  Musculoskeletal: There appears to be good anatomic correction of the second toe deformity.  There is no notable pain around the surgical site.  Skin: Surgical incision is well-healed and approximated with suture.  There is no peri-incisional erythema.  There is no active bleeding.  There is no bruising.  Lymphatic:  No lymphadenopathy noted.   Neurological:  Alert & oriented x 3.   Psychiatric:  Affect normal, Judgment normal, Mood normal.        RADIOLOGY  None    LABS  None    PROCEDURES  None    FINAL IMPRESSION  Visit for suture removal  (primary encounter diagnosis)  Post-operative state  Plantar plate injury, left, subsequent encounter    Patient appears to be doing exceedingly well.  I have fitted her with a Budin splint to maintain anatomic alignment of the toe.  She will remain in the surgical sandal.  On next visit I will obtain follow-up x-rays to assess metatarsal osteotomy healing.    FOLLOW-UP  Follow up in 2 week(s)    A majority of this note has been dictated with a voice recognition system. Please excuse any identified errors in spelling or syntax. Every effort has been made to appropriately edit and correct upon completion.      Electronically signed by: Astrid Divine. Andru Genter,DPM, 04/09/2016 1:00 PM

## 2016-04-23 ENCOUNTER — Ambulatory Visit (HOSPITAL_BASED_OUTPATIENT_CLINIC_OR_DEPARTMENT_OTHER): Payer: Medicaid Other | Admitting: Podiatrist

## 2016-05-14 ENCOUNTER — Ambulatory Visit (HOSPITAL_BASED_OUTPATIENT_CLINIC_OR_DEPARTMENT_OTHER): Payer: Medicaid Other | Admitting: Podiatrist

## 2016-05-14 DIAGNOSIS — Z9889 Other specified postprocedural states: Principal | ICD-10-CM

## 2016-05-14 DIAGNOSIS — S99922D Unspecified injury of left foot, subsequent encounter: Secondary | ICD-10-CM

## 2016-05-14 NOTE — Progress Notes (Signed)
Post operative note    CHIEF COMPLAINT    Post surgical visit #3    HPI    Amanda Cervantes is a 51 year old female who presents for continued follow-up status repair of plantar plate injury second metatarsal phalangeal joint left with surgery being January 5.  She does endorse interval adverse event where she developed a skin eruption and rash to the incision site.  She is uncertain as to the cause.  She did have some slight superficial disturbance of the wound.  She elected not to be evaluated and self treated with warm water baths and baking soda.  Apparently this did allow for resolution of the skin reaction.  She does have photographs on her phone that I evaluated confirming her description.  05/14/2016  1:45 PM.    REVIEW OF SYSTEMS    See HPI for further details. Review of systems otherwise negative.       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 & was positive, never             actually took it    SOCIAL HISTORY  Social History    Marital status: Legally Separated   Spouse name:                       Years of education:                 Number of children:               Social History Main Topics    Smoking status: Former Smoker                                                                Packs/day: 0.33      Years: 10.00          Types: Cigarettes       Quit date: 10/15/2012    Comment: quit 3 months ago on 10/15/12    Alcohol use: Yes                Comment: minimal    Drug use: No              Sexual activity: Yes               Partners with: Female       Birth  control/protection: Tubal Ligation    Social History Narrative    she is originally from [place] ,[place].  She came to the [place] in 2005     In [place], she worked as a Musician.     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  Afebrile and hemodynamically stable.  Constitutional:  Well-developed, Well-nourished, No acute distress, Non-toxic appearance.   Cardiovascular: Exhibits a warm and well-perfused foot left.  Distal pulses are palpable.  Capillary filling time is immediate.  Skin temperature and gradient is normal.  Musculoskeletal: There is good anatomic position of the second toe left.  She is not endorsing any pain with flexion and extension of the toe.  It is minimally elevated.  There is still some low-grade swelling around the toe.  Skin: Dorsal incision cicatrix is well-healed at this time.  I do not appreciate any residual skin eruption or rash.  There are no open wounds.  There is no active drainage or bleeding.  There is no bruising.  Lymphatic:  No lymphadenopathy noted.   Neurological:  Alert & oriented x 3.  Exhibits no motor or sensory deficit to the extremity.  Psychiatric:  Affect normal, Judgment normal, Mood normal.        RADIOLOGY  None    LABS  None    PROCEDURES  None    FINAL IMPRESSION  Post-operative state  (primary encounter diagnosis)  Plantar plate injury, left, subsequent encounter    Patient is noted to be wearing a poorly supportive slip on type moccasin shoe which is inappropriate for her postoperative state.  She has been educated on use of a running shoe or jogging shoe to provide greater stability.  She is also instructed on digital strapping to help maintain toe position.  Otherwise she is doing very well and does not endorse any residual pain to  the region.    FOLLOW-UP  Follow up in 6 week(s)    A majority of this note has been dictated with a voice recognition system. Please excuse any identified errors in spelling or syntax. Every effort has been made to appropriately edit and correct upon completion.      Electronically signed by: Astrid Divine. Adian Jablonowski,DPM, 05/14/2016 1:59 PM

## 2016-05-28 ENCOUNTER — Ambulatory Visit (HOSPITAL_BASED_OUTPATIENT_CLINIC_OR_DEPARTMENT_OTHER): Payer: Medicaid Other | Admitting: Podiatrist

## 2016-05-28 ENCOUNTER — Ambulatory Visit (HOSPITAL_BASED_OUTPATIENT_CLINIC_OR_DEPARTMENT_OTHER): Payer: Medicaid Other | Admitting: Gastroenterology

## 2016-05-28 VITALS — BP 172/101 | HR 125 | Temp 99.5°F | Resp 12 | Ht 64.17 in | Wt 200.0 lb

## 2016-05-28 DIAGNOSIS — D123 Benign neoplasm of transverse colon: Principal | ICD-10-CM | POA: Insufficient documentation

## 2016-05-28 NOTE — Progress Notes (Signed)
This office note has been dictated. Account number [id number]

## 2016-05-29 NOTE — Progress Notes (Signed)
Date of Service: 05/28/2016    HISTORY OF PRESENT ILLNESS:  The patient was seen in followup after she had a colonoscopy for screening purposes.  The colonoscopy showed the presence of 2 diminutive polyps in the sigmoid colon, 1 diminutive polyp in the transverse colon, diverticulosis in the ascending colon, and hypertrophied anal papillae.  The pathology of the polyps removed showed the transverse colon polyp to be a tubular adenoma, while the 2 sigmoid polyps to be hyperplastic.    The patient is doing well and has no complaints.    PHYSICAL EXAMINATION:  Blood pressure 172/101, pulse 125, temperature 99.5, oxygen saturation 99%, and weight 200 pounds.    ASSESSMENT AND PLAN:  I explained the findings to the patient, and I told her that since one of the polyps was adenomatous, a repeat colonoscopy is indicated in 5 years' time.    I informed her about the finding of diverticulosis in the right colon, but told her that the only possible complication of this would be lower gastrointestinal bleeding, which is quite rare.    ___________________________  Reviewed and Electronically Signed By: Junius Argyle MD  Sig Date: 06/11/2016  Sig Time: 12:46:53  Dictated By: Junius Argyle MD  Dict Date: 05/28/2016 Dict Time: 04 41 PM    Dictation Date and Time:05/28/2016 16:41:28  Transcription Date and Time:05/29/2016 48:62:82  eScription Dictation id: [id number] Confirmation # :[id number]      cc: Gavin Pound MD

## 2016-06-25 ENCOUNTER — Ambulatory Visit (HOSPITAL_BASED_OUTPATIENT_CLINIC_OR_DEPARTMENT_OTHER): Payer: Medicaid Other | Admitting: Podiatrist

## 2016-06-25 DIAGNOSIS — Z9889 Other specified postprocedural states: Principal | ICD-10-CM

## 2016-06-25 DIAGNOSIS — S99922D Unspecified injury of left foot, subsequent encounter: Secondary | ICD-10-CM

## 2016-06-25 DIAGNOSIS — L905 Scar conditions and fibrosis of skin: Secondary | ICD-10-CM

## 2016-06-25 NOTE — Progress Notes (Signed)
Post operative note    CHIEF COMPLAINT    Post surgical visit #4    HPI    Amanda Cervantes is a 51 year old female who presents for continued follow-up status repair of plantar plate disturbance second metatarsophalangeal joint left.  Index surgery was performed on January 5.  She does endorse some residual pain when she rests the foot on a stool.  Otherwise she is doing well and has returned to activities of daily life without any notable restriction.  She does appreciate some asymmetry and color between the left and right foot with the left being somewhat darker.  She also appreciates the incision scar overlying the second toe left which is also somewhat darker.  06/25/2016  1:30 PM.    REVIEW OF SYSTEMS    See HPI for further details. Review of systems otherwise negative.       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 & was positive, never             actually took it    SOCIAL HISTORY  Social History    Marital status: Legally Separated   Spouse name:                       Years of education:                 Number of children:               Social History Main Topics    Smoking status: Former Smoker                                                                Packs/day: 0.33      Years: 10.00          Types: Cigarettes       Quit date: 10/15/2012    Comment: quit 3 months ago on 10/15/12    Alcohol use: Yes                Comment: minimal    Drug use: No              Sexual activity: Yes               Partners with: Female       Birth control/protection: Tubal  Ligation    Social History Narrative    she is originally from [place] ,[place].  She came to the [place] in 2005     In [place], she worked as a Musician.     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  Constitutional:  Well-developed, Well-nourished, No acute distress, Non-toxic appearance.   Cardiovascular: Exhibits a warm and well-perfused foot left.  Distal pulses are palpable.  Capillary filling time is immediate.  Skin temperature and gradient is normal.  There is no evidence of pitting edema.  Musculoskeletal: There is good anatomic position of the second metatarsophalangeal joint left.  Passive range of motion does not produce any pain or mechanical impedance.  She does not endorse any significant pain with palpation to the joint.  The toe does remain somewhat elevated but can purchase the ground with weightbearing.  She is seen independently ambulating without any impedance in daily foot wear in the clinic.  Skin: Incision cicatrix to the dorsum of the second metatarsophalangeal joint left is somewhat hypertrophic but not a keloid by presentation.  Tissues are locally supple.  There is no tenderness with applied pressure.  Lymphatic:  No lymphadenopathy noted.   Neurological:  Alert & oriented x 3.   Psychiatric:  Affect normal, Judgment normal, Mood normal.        RADIOLOGY  None    LABS  None    PROCEDURES  None    FINAL IMPRESSION  Post-operative state  (primary encounter diagnosis)  Plantar plate injury, left, subsequent encounter  Cicatrix of skin    Patient appears to be recovering appropriately.  I see no complication to the surgery.  I did explain it may take up to 6 months to fully recover from this ligament repair.  She is to continue with activity to  tolerance.  I have recommended Mederma ointment for the incision cicatrix.  She endorses full understanding and is in agreement.    FOLLOW-UP  Follow up in 3 month(s)    A majority of this note has been dictated with a voice recognition system. Please excuse any identified errors in spelling or syntax. Every effort has been made to appropriately edit and correct upon completion.      Electronically signed by: Astrid Divine. Lonetta Blassingame,DPM, 06/25/2016 1:41 PM

## 2016-09-06 ENCOUNTER — Ambulatory Visit (HOSPITAL_BASED_OUTPATIENT_CLINIC_OR_DEPARTMENT_OTHER): Payer: Self-pay | Admitting: Ambulatory Care

## 2016-09-06 NOTE — Telephone Encounter (Signed)
Having blisters on arms shoulders , back , these blisters burst and are itchy , kind of burn .     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 After she leaves those houses with trees she noted she broke out in these blisters . This has been happening for the last 2 week .     Offered appointment Monday , has to work .   Accepted an appointment with Dr Patrice Paradise 09/12/16 .     Advised to start Benadryl which she has at home and follow directions on bottle . May cause drowsiness     Purchase Claritin and start that tomorrow (non drowsy) .   If not improved or symptoms exacerbate , please call for sooner appointment .     Told patient if they are in any acute distress , please go to the Emergency room for evaluation . Patient agrees .     Will forward to Dr Patrice Paradise to review .

## 2016-09-06 NOTE — Telephone Encounter (Signed)
Regarding: boils on body  ----- Message from [name] sent at 09/06/2016  2:10 PM EDT -----  Amanda Cervantes [id number], 51 year old, female    Calls today:  Sick    What are the symptoms: patient is calling, has fluid boils on her back and shoulders, very painful, and popping open. Patient said every time one opens up, more come.   How long has patient been sick? Two weeks but getting worse, and spreading.   What has pt. tried at home: ointment   Person calling on behalf of patient: Patient (self)    CALL BACK NUMBER: [phone]    Patient's language of care: [place]  Patient needs a [place] interpreter.  Patient's PCP: Gavin Pound, MD

## 2016-09-12 ENCOUNTER — Ambulatory Visit (HOSPITAL_BASED_OUTPATIENT_CLINIC_OR_DEPARTMENT_OTHER): Payer: Medicaid Other | Admitting: Internal Medicine

## 2016-09-12 ENCOUNTER — Encounter (HOSPITAL_BASED_OUTPATIENT_CLINIC_OR_DEPARTMENT_OTHER): Payer: Self-pay | Admitting: Internal Medicine

## 2016-09-12 VITALS — BP 128/76 | HR 79 | Temp 99.2°F | Wt 207.0 lb

## 2016-09-12 DIAGNOSIS — R21 Rash and other nonspecific skin eruption: Principal | ICD-10-CM

## 2016-09-12 DIAGNOSIS — E669 Obesity, unspecified: Secondary | ICD-10-CM

## 2016-09-12 DIAGNOSIS — R7303 Prediabetes: Secondary | ICD-10-CM

## 2016-09-12 DIAGNOSIS — I10 Essential (primary) hypertension: Secondary | ICD-10-CM

## 2016-09-12 NOTE — Progress Notes (Signed)
Cc: rash & several issues    Rash  She notes that over the past 1-2 weeks she's had blistering rashes on her arms & legs  She notes that they'll start out small, red, swelling, then they'll be very itchy, some will become blisters, then the blisters will peel off  She doesn't recall any new meds or supplements   She doesn't recall any unusual contacts  She feels as if these come about when she is outside of her home & not worse on the weekend  The patient denies cough, chest pain, dyspnea, wheezing or hemoptysis.   No sick contacts  No systemic symptoms   She's been using a calamine-based lotion to help with the itching    HTN/pre-DM/obesity  See EPIC Problem List for HPI.  She reports taking the hypertension medications as on her EPIC med list below.  She reports no difficulty with compliance or side-effects  She denies chest pain, dyspnea, headache, lower extremity edema or localized weakness or paresthesias  Her last three blood pressures in clinic were:  Most Recent BP Reading(s)  09/12/16 : 128/76  05/28/16 : (!) 172/101  03/23/16 : 115/77    Her Most Recent Weight Reading(s)  09/12/16 : 93.9 kg (207 lb)  05/28/16 : 90.7 kg (200 lb)  03/23/16 : 92.5 kg (204 lb)  02/14/16 : 90.7 kg (200 lb)      last potassium was   POTASSIUM (mmol/L)   Date Value   11/21/2015 3.9   ----------  last creatinine was   CREATININE (mg/dL)   Date Value   11/21/2015 0.8   ----------    Social History Narrative    she is originally from [place] ,[place].  She came to the [place] in 2005     In [place], she worked as a Musician.     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 Regular rate and rhythm.   Lungs:  clear; no wheezes, rhonchi or rales.  Skin: lesion on her legs, forearms, and one on her right shoulder, a variety of stages from erythematous papules, to a few blisters each <2cm in diameter, to several that are shallow ulcers  No lesions between her fingers or toes     ASSESSMENT & PLAN:  (R21) Rash and other nonspecific skin eruption  (primary encounter diagnosis)  Comment: this is quite an unusual presentation - given the distribution & pattern of different lesions in a variety of stages I'm wondering if it might be bed bugs - however, the blisters seem to be unusual for that; it does not apear to be scabies as these are not linear in shape & not in typical location; I have also considered blistering diseases such as pemphigoid & pemphigus - a remote possibility but the blisters do not seem to be the predominant lesion - here they seem to be due to some sort of intense reaction in these specific locations rather than an illness where the skin is disrupted -  the blisters are all small <2cm  In short, I'm not sure what is the cause & a timely derm apt is the next best step.  For now, I recommended that she have her home treated for bed bugs. Also can continue with the calamine-based lotion she's been using for symptomatic treatment  Plan: REFERRAL TO DERMATOLOGY ( INT)            (R73.03) Pre-diabetes --???DM  Comment: we discussed that her increasing weight may be a problem leading to diabetes, discussed modest weight loss      (E66.9) Obesity (BMI 30-39.9)  Comment: as above      (I10) Essential hypertension, benign  Comment: well controlled continue current regimen, but her recent weight gain is concerning      The patient was ready to learn and no apparent learning or adherence barriers were identified. I explained the diagnosis and treatment plan, and the patient expressed understanding of the content. I attempted to answer any questions  regarding the diagnosis and the proposed treatment.    Possible side effects of the prescribed medication was explained. We discussed the patients current medications.  We discussed the importance of medication compliance. The patient expressed understanding and no barriers to adherence were identified.    follow-up will be scheduled for 1 week from now if there is any worsening  she has been advised to call or return with any worsening or new problems

## 2016-09-17 ENCOUNTER — Ambulatory Visit (HOSPITAL_BASED_OUTPATIENT_CLINIC_OR_DEPARTMENT_OTHER): Payer: Medicaid Other | Admitting: Dermatology

## 2016-09-24 ENCOUNTER — Ambulatory Visit (HOSPITAL_BASED_OUTPATIENT_CLINIC_OR_DEPARTMENT_OTHER): Payer: Medicaid Other | Admitting: Podiatrist

## 2016-10-15 ENCOUNTER — Ambulatory Visit (HOSPITAL_BASED_OUTPATIENT_CLINIC_OR_DEPARTMENT_OTHER): Payer: Medicaid Other | Admitting: Podiatrist

## 2016-10-22 ENCOUNTER — Ambulatory Visit (HOSPITAL_BASED_OUTPATIENT_CLINIC_OR_DEPARTMENT_OTHER): Payer: Medicaid Other | Admitting: Podiatrist

## 2016-10-22 ENCOUNTER — Ambulatory Visit (HOSPITAL_BASED_OUTPATIENT_CLINIC_OR_DEPARTMENT_OTHER): Payer: Medicaid Other | Admitting: Dermatology

## 2016-10-29 ENCOUNTER — Ambulatory Visit (HOSPITAL_BASED_OUTPATIENT_CLINIC_OR_DEPARTMENT_OTHER): Payer: Medicaid Other | Admitting: Dermatology

## 2016-10-29 DIAGNOSIS — W57XXXA Bitten or stung by nonvenomous insect and other nonvenomous arthropods, initial encounter: Secondary | ICD-10-CM

## 2016-10-29 DIAGNOSIS — L282 Other prurigo: Principal | ICD-10-CM

## 2016-10-29 MED ORDER — BETAMETHASONE DIPROPIONATE 0.05 % EX CREA: g | Freq: Two times a day (BID) | CUTANEOUS | 0 refills | 0 days | Status: AC | PRN

## 2016-10-29 MED ORDER — BETAMETHASONE DIPROPIONATE 0.05 % EX CREA
TOPICAL_CREAM | Freq: Two times a day (BID) | CUTANEOUS | 0 refills | Status: AC | PRN
Start: 2016-10-29 — End: 2017-10-29

## 2016-10-29 NOTE — Progress Notes (Signed)
Sent by Gavin Pound for urgent evaluation of: blistering lesions    HPI:   Amanda Cervantes is a very pleasant 51 year old female who presents for evaluation of itchy rash.  According to Shaune Leeks this has been present for approximately 2 months.  It is located on the trunk and extremities. Appears as red itchy bumps that become swollen. When it first started they would appear as little vesicles that would expand. She started taking claritin and since then she no longer gets vesicles but she does get the little bumps. They are very itchy for 2 days then start to resolve slowly and leave marks. She has been applying hydrocortisone to them which helps a little.  No other dermatologic complaints or concerns today.    Soc Hx: works with 3 other people, does not share a bedroom with anyone else, noone else has rash/itch. No recent travel. No visitors to her home. She works as a Engineer, building services. She has a cat who she bathes regularly, has not given flea treatment. Has looked for signs of bed bugs and has not found any. Changes her sheets q3 days.    Review of Systems -   No fevers, chills, NS, weight loss and no other skin complaints.    Problem List:  Patient Active Problem List:     Obesity (BMI 30-39.9)     Essential hypertension, benign     Tobacco dependence     Pre-diabetes --???DM     Left knee pain     Left leg pain     Thumb pain     Headache disorder     Diverticulitis of large intestine without perforation or abscess without bleeding     History of colonic polyps     Plantar plate injury, left, initial encounter     Metatarsal deformity, left      Current Medications:    Current Outpatient Prescriptions:  betamethasone dipropionate (DIPROSONE) 0.05 % cream Apply topically 2 (two) times daily as needed Disp: 45 g Rfl: 0   chlorthalidone (HYGROTEN) 25 MG tablet Take 1 tablet by mouth daily Disp: 90 tablet Rfl: 12   lisinopril (PRINIVIL,ZESTRIL) 40 MG tablet Take 1 tablet by mouth daily Disp: 90 tablet Rfl:  12   ergocalciferol (VITAMIN D2) 50000 UNIT capsule Take 1 capsule by mouth once a week. Disp: 4 capsule Rfl: 2     No current facility-administered medications for this visit.     Allergies:  Review of Patient's Allergies indicates:   Penicillins                 Comment:Took skin 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 & was positive, never             actually took it    Vitals:  Extended Vitals not filed for this encounter.    Pt is WD, WN, comfortable, alert and oriented    PHYSICAL EXAM  Examination was performed of the scalp, head, neck, ears, eyes, mouth, b/l upper extremities, hands, b/l lower extremities, feet, nails, chest, abdomen, back, groin  - asymmetric clusters of hyperpigmented round macules, erythematous round papules, and erythematous edematous round papules with surrounding round macular erythema on the upper outer arms, forearms, ankles, lateral back, inner thigh  - no vesicles, erosions, bullae      Assessment/Plan:  Overall clinical presentation and history is suggestive of:    Arthropod bite, initial encounter  (primary encounter diagnosis)  - flea bites (cat) v. Bed bugs v. Less  likely mosquito bites    For now I recommend:  - advised to empirically treat cat for fleas  - if continues to get bites, should have professional exterminator examine her apt  - betamethasone cream bid thin layer to help lesions resolve faster (warned of atrophy with chronic use)  - cont claritin as seems to be reducing the exuberance of her bite reactions  RTC 2-3 months      Wilburn Mylar, MD  10/29/2016

## 2016-10-29 NOTE — Patient Instructions (Signed)
Treat your cat for fleas.    If you still get the rash, I recommend having a professional exterminator examine your house for bed bugs or other biting bugs.

## 2016-10-31 ENCOUNTER — Encounter (HOSPITAL_BASED_OUTPATIENT_CLINIC_OR_DEPARTMENT_OTHER): Payer: Self-pay | Admitting: Internal Medicine

## 2016-10-31 DIAGNOSIS — R21 Rash and other nonspecific skin eruption: Secondary | ICD-10-CM | POA: Insufficient documentation

## 2017-01-14 ENCOUNTER — Ambulatory Visit (HOSPITAL_BASED_OUTPATIENT_CLINIC_OR_DEPARTMENT_OTHER): Payer: Medicaid Other | Admitting: Dermatology

## 2017-01-14 DIAGNOSIS — L282 Other prurigo: Principal | ICD-10-CM

## 2017-01-14 NOTE — Progress Notes (Signed)
HPI:   Amanda Cervantes is a very pleasant 51 year old female who presents for f/u arthropod bites. Pt says she had someone from her building examine for bed bugs and they did not find any. She treated her cat for fleas as well. Despite this she continues to get the itchy red bumps. She thinks it comes after eating certain foods, especially foods with pepper and sazon. When she cooks for herself and excludes these she does not get the bumps. Yesterday she went to her friend's house and had her cooking and then 1-2 hours later developed itchy red bumps on her L arm and abdomen (photos taken today). They last 2-3 days and heal with dark marks. claritin and the betamethasone cream both seem to help them heal. She takes claritin daily. She denies h/o lip swelling. She has had lesions on her eyelids and ear and palm before. Otherwise feels well without difficulty breathing or swallowing.    At last visit:  "Arthropod bite, initial encounter  (primary encounter diagnosis)  - flea bites (cat) v. Bed bugs v. Less likely mosquito bites  For now I recommend:  - advised to empirically treat cat for fleas  - if continues to get bites, should have professional exterminator examine her apt  - betamethasone cream bid thin layer to help lesions resolve faster (warned of atrophy with chronic use)  - cont claritin as seems to be reducing the exuberance of her bite reactions  RTC 2-3 months"    Review of Systems -   No fevers, chills, NS, weight loss and no other skin complaints.    Problem List:  Patient Active Problem List:     Obesity (BMI 30-39.9)     Essential hypertension, benign     Tobacco dependence     Pre-diabetes --???DM     Left knee pain     Left leg pain     Thumb pain     Headache disorder     Diverticulitis of large intestine without perforation or abscess without bleeding     History of colonic polyps     Plantar plate injury, left, initial encounter     Metatarsal deformity, left     Rash      Current  Medications:    Current Outpatient Medications:  betamethasone dipropionate (DIPROSONE) 0.05 % cream Apply topically 2 (two) times daily as needed Disp: 45 g Rfl: 0   chlorthalidone (HYGROTEN) 25 MG tablet Take 1 tablet by mouth daily Disp: 90 tablet Rfl: 12   lisinopril (PRINIVIL,ZESTRIL) 40 MG tablet Take 1 tablet by mouth daily Disp: 90 tablet Rfl: 12   ergocalciferol (VITAMIN D2) 50000 UNIT capsule Take 1 capsule by mouth once a week. Disp: 4 capsule Rfl: 2     No current facility-administered medications for this visit.     Allergies:  Review of Patient's Allergies indicates:   Penicillins                 Comment:Took skin 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 & was positive, never             actually took it    Vitals:  Extended Vitals not filed for this encounter.    Pt is WD, WN, comfortable, alert and oriented    PHYSICAL EXAM  Examination was performed of the scalp, head, neck, ears, eyes, mouth, b/l upper extremities, hands, b/l lower extremities, chest, abdomen, back  - 3 erythematous edematous round papules with peripheral erythema clustered on L  forearm  - few erythematous edematous papules clustered on abdomen  - numerous round hyperpigmented macules clustered on R distal arm, sides of hand      Assessment/Plan:  Overall clinical presentation and history is suggestive of:    Papular urticaria  - still looks most c/w arthropod bite but given history of flares after certain foods and that did not improve with treating cat for fleas (and no bed bugs found in apt), will refer to allergy for testing    For now I recommend:  - referral to allergy to r/o urticaria due to systemic food allergy   - in the meantime continue claritin daily, can use topical steroid for prn relief  RTC if above w/u is unrevealing  Wilburn Mylar, MD  01/14/2017

## 2017-02-21 ENCOUNTER — Telehealth (HOSPITAL_BASED_OUTPATIENT_CLINIC_OR_DEPARTMENT_OTHER): Payer: Self-pay

## 2017-02-21 ENCOUNTER — Other Ambulatory Visit (HOSPITAL_BASED_OUTPATIENT_CLINIC_OR_DEPARTMENT_OTHER): Payer: Self-pay | Admitting: Internal Medicine

## 2017-02-21 DIAGNOSIS — I10 Essential (primary) hypertension: Principal | ICD-10-CM

## 2017-02-21 NOTE — Progress Notes (Signed)
[facility]NAL MED    Person calling on behalf of patient: Patient (self)    May list multiple medications in this section    Medicine Name: LISINOPRIL                             CHLORTHALIDONE    Dosage:    Frequency (how many pills, how many times a day):    Number of pills left:     Documented patient preferred pharmacies:   [facility], [address]  Phone: [phone] Fax: [phone]          Patient's language of care: [place]

## 2017-02-21 NOTE — Progress Notes (Signed)
PER Pharmacy,Amanda Cervantes is a 51 year old female has requested a refill oflisinopril chlorthalidone .  ?  Last Office Visit: 09/12/2016 with Gavin Pound   Last Physical Exam: 01/09/2016  ?  Other Med Adult:  Most Recent BP Reading(s)  09/12/16 : 128/76        Cholesterol (mg/dl)   Date Value   10/29/2011 162     LOW DENSITY LIPOPROTEIN DIRECT (mg/dl)   Date Value   10/29/2011 101 (H)     HIGH DENSITY LIPOPROTEIN (mg/dl)   Date Value   10/29/2011 31 (L)     TRIGLYCERIDES (mg/dl)   Date Value   08/04/2009 99         THYROID SCREEN TSH REFLEX FT4 (uIU/mL)   Date Value   05/04/2014 1.530         No results found for: TSH    HEMOGLOBIN A1C (%)   Date Value   01/09/2016 6.3 (H)       No results found for: POCA1C      No results found for: INR    SODIUM (mmol/L)   Date Value   11/21/2015 140       POTASSIUM (mmol/L)   Date Value   11/21/2015 3.9           CREATININE (mg/dL)   Date Value   11/21/2015 0.8     Documented patient preferred pharmacies:    [facility], [address]  Phone: [phone] Fax: [phone]

## 2017-02-21 NOTE — Progress Notes (Signed)
Called and spoke with patient who said that will come in for labs next week.    Amanda Cervantes,   Please call patient to have a follow-up blood work done this week or next.   Much thanks,   [employer]

## 2017-02-22 MED ORDER — LISINOPRIL 40 MG PO TABS
40.0000 mg | ORAL_TABLET | Freq: Every day | ORAL | 12 refills | Status: DC
Start: 2017-02-22 — End: 2018-03-31

## 2017-02-22 MED ORDER — CHLORTHALIDONE 25 MG PO TABS: 25 mg | tablet | Freq: Every day | ORAL | 12 refills | 0 days | Status: AC

## 2017-02-22 MED ORDER — CHLORTHALIDONE 25 MG PO TABS
25.0000 mg | ORAL_TABLET | Freq: Every day | ORAL | 12 refills | Status: DC
Start: 2017-02-22 — End: 2018-03-31

## 2017-02-22 MED ORDER — LISINOPRIL 40 MG PO TABS: 40 mg | tablet | Freq: Every day | ORAL | 12 refills | 0 days | Status: AC

## 2017-02-25 ENCOUNTER — Ambulatory Visit (HOSPITAL_BASED_OUTPATIENT_CLINIC_OR_DEPARTMENT_OTHER): Payer: Medicaid Other | Admitting: Lab

## 2017-02-25 DIAGNOSIS — R7303 Prediabetes: Principal | ICD-10-CM

## 2017-02-25 NOTE — Progress Notes (Signed)
Labs drawn

## 2017-02-26 ENCOUNTER — Encounter (HOSPITAL_BASED_OUTPATIENT_CLINIC_OR_DEPARTMENT_OTHER): Payer: Self-pay | Admitting: Internal Medicine

## 2017-02-26 LAB — HEMOGLOBIN A1C
ESTIMATED AVERAGE GLUCOSE: 180 — ABNORMAL HIGH (ref 74–160)
HEMOGLOBIN A1C: 7.9 % — ABNORMAL HIGH (ref 4.0–5.6)

## 2017-02-26 NOTE — Progress Notes (Signed)
Hi Amanda Cervantes,  Let her know her blood test is not normal & she needs to come to see me soon to discuss - 1-2 weeks maximum.  Thanks, [employer]

## 2017-02-28 ENCOUNTER — Ambulatory Visit (HOSPITAL_BASED_OUTPATIENT_CLINIC_OR_DEPARTMENT_OTHER): Payer: Medicaid Other | Admitting: Otolaryngology

## 2017-02-28 DIAGNOSIS — Z91018 Allergy to other foods: Principal | ICD-10-CM

## 2017-02-28 NOTE — Progress Notes (Signed)
HPI: 51 year old female Deatra D Plancarte complaints of body rash with blisters 3 mo ago. Now she only gets rash. There is no respiratory sx. She suspects allergy to chili pepper and to eating chinese food. Pt has no dyspnea, no dysphagia, no fever today.    Past nasal or throat surgery: No  FH of nasal or pharyngeal condition: No     Sx Relief factors No  Sx Exacerbation factors No    Past Medical History:   Diagnosis Date    HTN (hypertension)        Review of Patient's Allergies indicates:   Penicillins                 Comment:Took skin 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 & was positive, never             actually took it      Current Outpatient Medications on File Prior to Visit:  chlorthalidone (HYGROTEN) 25 MG tablet Take 1 tablet by mouth daily Disp: 90 tablet Rfl: 12   lisinopril (PRINIVIL,ZESTRIL) 40 MG tablet Take 1 tablet by mouth daily Disp: 90 tablet Rfl: 12   betamethasone dipropionate (DIPROSONE) 0.05 % cream Apply topically 2 (two) times daily as needed Disp: 45 g Rfl: 0   ergocalciferol (VITAMIN D2) 50000 UNIT capsule Take 1 capsule by mouth once a week. Disp: 4 capsule Rfl: 2     No current facility-administered medications on file prior to visit.     Social and family history reviewed either in EPIC or with patient and are not contributory unless specifically noted in HPI.    Review of Systems  Constitutional normal.  CV/Palpitations No.  Resp/SOB No.  Integ/Skin lesions No.  MSk/Pain No.  Psych normal.    Physical Exam  There were no vitals filed for this visit.    Constitutional: normal.  Facial lesions: No.  Ears: Right: Obstructing cerumen:No. EAC dry and open. TM normal.    Left:  Obstructing cerumen:No. EAC dry and open. TM normal.   Facial Nerve: normal One out of 6 House-Brackmann scale bilaterally.  Nose:  normal.  Oral Cavity: normal.  O/P: Tonsils 1/4 in size bilateral.  Sinus: Pain No.  Neck: Soft. Trachea midline. No palpable masses.  Thyroid: Normal to palpation.  Lymph:  No palpable LN in the  neck.  Salivary glands:  Normal size, non-tender.  Communication: Voice normal.    Eyes: PERRLA and EOMI. Nystagmus No  Respiratory drive spontaneous and comfortable.  Cardiac rhythm regular.  Neuro: normal.  Mood/Psych: normal.    A/P  51 year old female with rash and blister, questionable food allergy. Recommend RAST testing on chili pepper and f/u with allergy staff. Recommend dermatology consultation.     This record was generated using voice recognition software. I proof-read and corrected any voice recognitions errors found. Please excuse any remaining voice recognition errors which were not detected.

## 2017-02-28 NOTE — Addendum Note (Signed)
Addended by: Quentin Cornwall on: 02/28/2017 04:02 PM     Modules accepted: Orders

## 2017-03-04 LAB — F279 CHILI PEPPER: F279 CHILI PEPPER: <0.1 kU/L

## 2017-03-06 ENCOUNTER — Ambulatory Visit (HOSPITAL_BASED_OUTPATIENT_CLINIC_OR_DEPARTMENT_OTHER): Payer: Medicaid Other | Admitting: Internal Medicine

## 2017-03-25 ENCOUNTER — Encounter (HOSPITAL_BASED_OUTPATIENT_CLINIC_OR_DEPARTMENT_OTHER): Payer: Self-pay | Admitting: Internal Medicine

## 2017-03-25 ENCOUNTER — Ambulatory Visit (HOSPITAL_BASED_OUTPATIENT_CLINIC_OR_DEPARTMENT_OTHER): Payer: Medicaid Other | Admitting: Internal Medicine

## 2017-03-25 VITALS — BP 120/82 | HR 75 | Temp 98.2°F | Ht 64.0 in | Wt 208.0 lb

## 2017-03-25 DIAGNOSIS — M79605 Pain in left leg: Secondary | ICD-10-CM

## 2017-03-25 DIAGNOSIS — Z0000 Encounter for general adult medical examination without abnormal findings: Principal | ICD-10-CM

## 2017-03-25 DIAGNOSIS — E1165 Type 2 diabetes mellitus with hyperglycemia: Secondary | ICD-10-CM

## 2017-03-25 DIAGNOSIS — E669 Obesity, unspecified: Secondary | ICD-10-CM

## 2017-03-25 DIAGNOSIS — E559 Vitamin D deficiency, unspecified: Secondary | ICD-10-CM

## 2017-03-25 DIAGNOSIS — R21 Rash and other nonspecific skin eruption: Secondary | ICD-10-CM

## 2017-03-25 DIAGNOSIS — Z860100 Personal history of colon polyps, unspecified: Secondary | ICD-10-CM

## 2017-03-25 DIAGNOSIS — I10 Essential (primary) hypertension: Secondary | ICD-10-CM

## 2017-03-25 DIAGNOSIS — Z23 Encounter for immunization: Secondary | ICD-10-CM

## 2017-03-25 LAB — BASIC METABOLIC PANEL
ANION GAP: 12 mmol/L (ref 5–15)
BUN (UREA NITROGEN): 13 mg/dL (ref 7–18)
CALCIUM: 10.1 mg/dL (ref 8.5–10.1)
CARBON DIOXIDE: 26 mmol/L (ref 21–32)
CHLORIDE: 103 mmol/L (ref 98–107)
CREATININE: 0.8 mg/dL (ref 0.4–1.2)
ESTIMATED GLOMERULAR FILT RATE: >60 mL/min (ref >60)
Glucose Random: 113 mg/dL (ref 74–160)
POTASSIUM: 3.4 mmol/L — ABNORMAL LOW (ref 3.5–5.1)
SODIUM: 141 mmol/L (ref 136–145)

## 2017-03-25 LAB — MICROALBUMIN RANDOM URINE
ALB/CREAT RATIO URINE RAN: 9 ug/mg (ref 0–30)
ALBUMIN URINE RANDOM: 1.1 mg/dL (ref 0.0–1.9)
CREATININE RANDOM URINE: 128 mg/dl

## 2017-03-25 LAB — LIPID PANEL
Cholesterol: 186 mg/dL (ref 0–239)
HIGH DENSITY LIPOPROTEIN: 41 mg/dL (ref 40–?)
LOW DENSITY LIPOPROTEIN DIRECT: 123 mg/dL (ref 0–189)
TRIGLYCERIDES: 156 mg/dL — ABNORMAL HIGH (ref 0–150)

## 2017-03-25 LAB — VITAMIN D,25 HYDROXY: VITAMIN D,25 HYDROXY: 17 ng/mL — CL (ref 30.0–100.0)

## 2017-03-25 MED ORDER — METFORMIN HCL 500 MG PO TABS
500.0000 mg | ORAL_TABLET | Freq: Every day | ORAL | 11 refills | Status: DC
Start: 2017-03-25 — End: 2017-04-14

## 2017-03-25 MED ORDER — METFORMIN HCL 500 MG PO TABS: 500 mg | tablet | Freq: Every day | ORAL | 11 refills | 0 days | Status: DC

## 2017-03-25 NOTE — Progress Notes (Signed)
03/25/2017  VIS given prior to administration and reviewed with the patient and or legal guardian. Patient understands the disease and the vaccine. See immunization/Injection module or chart review for date of publication and additional information.  Harrietta Guardian, RN

## 2017-03-26 ENCOUNTER — Telehealth (HOSPITAL_BASED_OUTPATIENT_CLINIC_OR_DEPARTMENT_OTHER): Payer: Self-pay

## 2017-03-26 LAB — HEMOGLOBIN A1C
ESTIMATED AVERAGE GLUCOSE: 180 — ABNORMAL HIGH (ref 74–160)
HEMOGLOBIN A1C: 7.9 % — ABNORMAL HIGH (ref 4.0–5.6)

## 2017-03-26 MED ORDER — CHOLECALCIFEROL 25 MCG (1000 UT) PO TABS
1000.0000 [IU] | ORAL_TABLET | Freq: Every day | ORAL | 5 refills | Status: DC
Start: 2017-03-26 — End: 2017-04-22

## 2017-03-26 MED ORDER — CHOLECALCIFEROL 1000 UNITS PO TABS: 1000 [IU] | tablet | Freq: Every day | ORAL | 5 refills | 0 days | Status: DC

## 2017-03-26 NOTE — Progress Notes (Signed)
Called Amanda Cervantes to let her know of her lab results  Informed her that aside from a slight elevation in triglycerides, her labs look as expected:  - Elevated A1C to 7.9 is confirmed  - No evidence of kidney dysfunction on BMP or microalbumin  - Mild hypokalemia per cutoffs looks normal for her, as she has a history of potassium readings around 3.2-3.4 without any symptoms (severe weakness, rhabdomyolysis, arrhythmias)  She also has a vitamin D down to 17, following reported several months of no supplementation  She has no evidence of CKD, malabsorption, or hyperparathyroidism, so she can be supplemented in the usual fashion  Per UpToDate: "For individuals with serum vitamin D levels of 10 to 20 ng/mL, initial supplementation with 800 to 1000 international units daily may be sufficient. A repeat serum 25(OH)D level should be obtained after approximately three months of therapy to assure obtaining the goal serum 25(OH)D level. If goal level is not achieved, higher doses may be necessary."  My plan is to prescribe her 1000 IU vitamin D3 QD and recheck level in a few months to see if she needs further supplementation.  She agreed with this plan and would like to pick up the prescription at [facility].    Devoria Glassing, 03/26/2017, 12:53 PM  [facility]-2  Pager [phone]

## 2017-03-26 NOTE — Progress Notes (Signed)
Agree w/ plan, if BP remains well controlled, given the low K+, I would also recommend trial of 12.5 mg dose of chlorthalidone

## 2017-03-27 DIAGNOSIS — E559 Vitamin D deficiency, unspecified: Secondary | ICD-10-CM | POA: Insufficient documentation

## 2017-03-27 NOTE — Progress Notes (Signed)
HPI  Amanda Cervantes is a 52 year old female with a history of HTN, obesity, and smoking who presents to discuss a recent blood test, on which she was found to have an HgA1c of 7.9%.    # New diagnosis of type 2 diabetes  Amanda Cervantes has been in her usual state of health since her last HgA1c of 6.3% in October 2017, and reports no new symptoms related to diabetes. In particular, she denies symptoms of excess thirst or urination, dry mouth, changes in vision, increased frequency of infections, or excessive skin darkening and itching. She additionally denies any new nausea or vomiting, changes in bowel movements, or abdominal pain, as well as changes to her urine or pain on urination. She also has not experienced any difficulty breathing, chest pain, or abnormal heart beat during the days; however, she has experienced some sensation of difficulty breathing at night, and is following up with ENT on 04/09/17 to evaluate for possible OSA. She does note that her diet has not been good, with poor content of fruits and vegetables, but she does not believe that she eats excessively sweet foods. Her weight has been normal and steady, and her menstrual periods have continued to come irregularly every few months as they had done throughout her life.    # HTN  Amanda Cervantes has been receiving treatment for HTN since 2013, first with chlorthalidone and later also with Lisinopril. She reports that she regularly measures her blood pressure at home, where it has been consistently normal, though it sometimes runs high when she comes to the doctor. She has generally been adherent to the medications, but every now and then she forgets to take one of them; particularly when she forgets to take the chlorthalidone, she notices that she urinates less frequently that day.    # Rash  Amanda Cervantes initially noticed a rash in August 2018, which presented as itchy blisters and sores, which healed to leave a dark spot on the skin. She saw  dermatology, who suggested that this might be arthropod bites (fleas vs bed bugs vs mosquitos). By October, the rash changed to a flatter red appearance with less blisters, but did not improve with treating her cat for fleas, and no bed bugs were found in her apartment. She also reports that she continues to have flares of the rash after she eats certain foods outside her home. She thinks that it is an allergy to a certain spice, as she never has it when she is eating at home, and she does not put many spices into the food that she cooks. She uses betamethasone cream as well as Claritin, which help resolve the rashes to leave dark spots, but she has been unable to stop the flares.    # Sadness  Amanda Cervantes has been experiencing some significant stress in her life recently. She has been working as a Electrical engineer, a job with long hours and lots of movement that exacerbates her leg pain; she is often feeling sleepy during the day and has trouble falling asleep, sleeping only from about 1am to 5am every night. Additionally, she has been living by herself since her husband was deported to [place] recently. She has a daughter in the [place] and a son in [place], and she is worried that if she moves to [place] to be with her husband, she will not come back. However, it has also been very difficult for her to maintain her marriage internationally. She is living day by day, and cant be  happy because there are no reasons for it. PHQ-9 score 11:  1. Little interest or pleasure in doing things     3  2. Feeling down, depressed, or hopeless      2  3. Trouble falling or staying asleep, or sleeping too much     2  4. Feeling tired or having little energy       1  5. Poor appetite or overeating        1  6. Feeling bad about yourself/failure       0  7. Trouble concentrating on things       1  8. Moving or speaking so slowly that other people could have noticed. Or being fidgety/restless.         1  9. Thoughts that you would be better  of dead, or thoughts of hurting yourself  0  10. If you checked off any problems, how difficult have these problems made it for you to do your work, take care of things at home, or get along with other people? somewhat    PMH   Left leg and knee pain  o Musculoskeletal pain x 10+ years, knee strengthening exercises have helped  o 2/14 initially had left knee pain  o 10/14 seen for Months of right leg pain -- hard to pin down an etiology -- suspect musculoskeletal due to work & obesity, tx with NSAID's & referred to PT  o 12/15 PT not helpful; now reports leg pain interfering with her life, has stopped working, not very active, pain appears to be musculoskeletal but neurologic is possible, referred to physiatry; use acetaminophen prn  o 4/16 Physiatry: bilateral knee osteoarthritis, left worse than right. Her pain has improved considerably with exercise and occasional medication. She also found that the injection also was beneficial. At this point it is too soon to repeat the injection.   o 12/16 bilateral knee osteoarthritis, left worse than right, left greater trochanteric bursa region pain; s/p corticosteroid injection   o 1/19: continues to have leg pain, left leg worse than right, starting behind the knee and extending down the back of the leg to the foot; pain is constant, jolting pain with movement, causes limping and difficulty walking up hills, worse with movement and feels like it is falling asleep. Pain is worst at the lateral aspects of the knees. Tylenol/ibuprofen help with the pain as before, but she is unhappy that she has to keep taking medications.   Plantar plate injury, left second toe  o 11/17: presented to podiatry with 1 year of left foot pain while walking, found to have plantar plate injury  o 2/59: surgery to repair plantar plate injury, has been recovering well since   Hand cramps  o Made worse by repetitive movements when she was doing a lot of vacuuming at work, has improved since she  stopped vacuuming   History of colonic polyps and diverticulitis  o 9/17 diagnosis of diverticulitis in ED following presentation for abdominal pain  o 11/17 colonoscopy, one diminutive polyp in the transverse colon, removed +adenomatous, diverticulosis in the ascending colon. GI recommended 5 yr follow up; but given one small polyp, 10 years might also be reasonable option   Vitamin D deficiency  o Remote history of vitamin D supplementation with 50,000 IU once a week, ran out of pills a few months ago and has not been taking more    Health Maintenance   Always stays up-to-date on her immunizations,  would like the flu and pneumococcal shots today   Up-to-date on colonoscopy, due for a lipid screen    Allergies  Penicillins  Comment: Took skin 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 & was positive, never actually took it    Medications  Current Outpatient Medications:     chlorthalidone (HYGROTEN) 25 MG tablet, Take 1 tablet by mouth daily, Disp: 90 tablet, Rfl: 12    lisinopril (PRINIVIL,ZESTRIL) 40 MG tablet, Take 1 tablet by mouth daily, Disp: 90 tablet, Rfl: 12    betamethasone dipropionate (DIPROSONE) 0.05 % cream, Apply topically 2 (two) times daily as needed, Disp: 45 g, Rfl: 0    ergocalciferol (VITAMIN D2) 50000 UNIT capsule, Take 1 capsule by mouth once a week., Disp: 4 capsule, Rfl: 2    Reports that she ran out of the ergocalciferol a few months ago, but is taking the chlorthalidone and Lisinopril and also putting the diprosone cream on her skin lesions. Also takes PRN ibuprofen and Tylenol for leg pain, and Claritin for rash.    Social History  Alcohol: occasionally drinks 1-2 beers on the weekends  Tobacco: used to smoke 1 pack every 3-4 days for 10-15 years, stopped 4 years ago  Drugs: never  Chemicals: standard cleaning chemicals like Windex, Clorox, nothing more toxic    Family History  Problem Relation Age of Onset    Heart Disease Father         died age 29 from MI; learned about HTN in 63's    Cancer - Other  Mother         stomach cancer, died    GI Sister         hernia    Thyroid Sister      Review of Systems  Constitutional: As per HPI  Skin: As per HPI  Eyes: As per HPI   Respiratory: As per HPI  Cardiovascular: As per HPI  GI: As per HPI  GU: As per HPI  Joints & Extremities: As per PMH  Endocrine: no heat/cold intolerance  Neurological: As per PMH  Psych: As per HPI    Vital Signs  BP 120/82 (Site: LA, Position: Sitting, Cuff Size: Lrg)  Pulse 75  Temp 98.2 F (36.8 C) (Oral)  Ht 5\' 4"  (1.626 m)  Wt 94.3 kg (208 lb)  LMP 02/22/2017 (Approximate)  BMI 35.7 kg/m2  Pain Score: 0 (0/10)    Physical Exam   General Appearance: Well appearing and comfortable, noticeably overweight, appears stated age, sitting in clinic, not in acute distress.     Head, Eyes, Ears, Nose, Throat:    Head is normocephalic and atruamatic.    Eyes: Conjunctiva are not injected. Sclera are anicteric. Pupils were equally round and reactive to light, with intact direct and consensual responses bilaterally. Gaze was conjugate. No ptosis.  Extra-ocular movements were intact with no nystagmus. Fundus visualized bilaterally with sharp optic disc margins and no evidence of hemorrhages, exudate, or vascular proliferation.  Ears: External ears without lesions. Acuity good to conversation and finger tap.  Throat: Oral mucosa pink, poor dentition; notes impending dental appointment.    Neck:     The neck is supple. There is no supraclavicular, submandibular, or cervical lymphadenopathy.  Thyroid was palpated and is slightly symmetrically enlarged with no notable nodularity.  The trachea is in the midline.    Cardiovascular:  Normal S1 and S2. No murmurs, rubs, or gallops. No JVD.     Pulmonary:  Both lung fields clear to auscultation. No wheezes,  crackles, or rhonchi. No use of accessory muscles.     Abdomen:   Normoactive bowel sounds and no abdominal bruits. Tympanic to percussion in 4 quadrants. On palpation, abdomen is soft, non-tender and  non-distended without guarding or rigidity. There were no palpable masses or hepatosplenomegaly.    Neuro:  MENTAL STATUS: Alert and oriented x 3. Short and long term memory, attention, and abstraction intact to conversation.   CRANIAL NERVES:  CN I-XII:     I: Not assessed.      II: PERRL  III,IV,VI: EOMI without ptosis or nystagmus.   V: Light-touch sensation on face was normal bilaterally with no EDSS.  VII: No facial asymmetry. Full strength of orbicularis oculi.  ?VIII: Hearing intact to conversation and finger tap.  IX,X: Palate elevates symmetrically.    XI: Strength of SCM and trapezius full.   XII: Tongue protrudes midline.     SENSATION:    Diffusely intact to light touch, including face, arms, and hands. Sensation symmetric in the feet with no stocking deficit bilaterally.  Vibration sense intact in great toes bilaterally, vibration threshold 16 and 18 sec on R and L toes, respectively.    GAIT:    Normal gait with no notable limping, intact toe and heel walking.    Extremities:  No pitting edema in lower extremities. Warm and well perfused, DP/PT pulses intact.    Skin:  Several erythematous papules on left shoulder and upper arm, about 5 mm in diameter on an erythematous base of about 1 cm in diameter. Also several hyperpigmented macules on left and right forearms, reportedly left from prior blisters and spots akin to the papules on the shoulder.     Labs  HgA1c (02/25/17): 7.9%  Chili pepper sensitivity test (02/28/17): negative    Assessment and Plan  ROBBY PIRANI is a 52 year old female with a history of HTN, obesity, and smoking who presents to discuss a recent blood test, on which she was found to have an HgA1c of 7.9%.    # New diagnosis of type 2 diabetes  Based on this evaluation, Amanda Cervantes currently exhibits no symptoms of diabetes. Specifically, she denies polydipsia, polyuria disproportional to what she experiences with the chlorthalidone, blurry vision, and recurrent infections or  poorly-healing wounds. Her skin changes are not consistent with acanthosis nigricans as they are papular in nature rather than patches, and localize to various parts of her arms, not just the skin folds. Her fundoscopy exam also shows no obvious dot and blot hemorrhages, cotton-wool spots, or vascular proliferation suggestive of retinopathy. While she does have a history of pain in her legs, she exhibits no sensory losses suggestive of neuropathy. Finally, she has no symptoms of hyperglycemic crisis, such as weight loss, N/V, hypotension, and altered mental status, or of hypoglycemia, such as tachycardia, diaphoresis, tremor, paresthesias, or palpitations.    Asymptomatic diabetes must first be confirmed with repeat testing of the glucose measure, in this case the HgA1c; however, her levels are quite high above the cutoff of 6.5%, so she very likely has true type 2 diabetes mellitus. Initial evaluation of a new case of T2DM further involves several lab tests, including BMP to check for BUN/Cr and electrolyte changes, lipid testing to check for hyperlipidemia, and urine microalbumin to check for early signs of nephropathy. In Amanda Cervantes case, while she currently displays no symptoms of diabetic neuropathy to warrant a referral to neurology, she does require an ophthalmology appointment to confirm that her retinae are healthy.  Treatment for T2DM begins with lifestyle modifications and metformin. Amanda Cervantes has been obese for some time, and while she considers her weight to be normal, her adiposity is a certain contributor to her disease. Although her work is quite rigid and intensive, Ezzie may at least be able to make some changes to her diet, as she herself already notes that it is not as good as she would like it to be. For this purpose, she should also be referred to a nutritionist, who could help Blue craft an actionable plan for a healthier diet. Exercise is another habit to encourage Amanda Cervantes to take on.  Although she does not have the flexibility in her schedule to start on a significant exercise program, even light to moderate exercise substantially improves outcomes in such patients; for patients like her with a BMI>35, a 10% reduction in weight is recommended for optimal results. Aside from lifestyle changes, Amanda Cervantes should also start on metformin, the first-line treatment for all patients with T2DM who lack contraindications such as renal failure, liver failure, chronic pancreatitis, heart failure, respiratory failure, or alcoholism. Metformin is initially prescribed at a dose of 500 mg QD, and then increased by 500 mg QD every one-two weeks as tolerated to a usual effective dose of 1500-2000 mg; common side effects of metformin include nausea, diarrhea, flatulence, and metallic taste in the mouth, and vitamin B12 deficiency as well as lactic acidosis are rarer findings. There is no risk of hypoglycemia on metformin, so it is safe to assess HgA1c every 3-6 months in such patients, along with serum creatinine annually and other studies only if new symptoms appear. The T2DM treatment algorithm recommends trialing new treatments for 3 months before rechecking the HgA1c, with a goal of under 7%. If Amanda Cervantes does not reach this goal with lifestyle changes and maximal dosage of metformin, a second oral drug such as a sulfonylurea, thiazolidinedione, SGLT-2 inhibitor, GLP-1 agonist, or DPP-4 inhibitor will need to be initiated depending on her preferences regarding price, hypoglycemia risk, and effects on weight among others. If this is still not sufficient to lower the HgA1c, additional oral medications may be tried, but it is likely that at this point she would require insulin, first a nightly basal dose and then potentially a mealtime dose as well if necessary. However, at the moment her HgA1c is not too elevated, so hopefully metformin along with good lifestyle adjustments will be enough to control her diabetes.  Additionally, Amanda Cervantes will need continued blood pressure control as well as improvement of blood lipid profile; in fact, for patients aged 10-75, statins should be initiated regardless of baseline lipid levels. Women older than 73 with cardiac risk factors also benefit from aspirin for primary prevention of cardiac problems, but this does not apply to Amanda Cervantes.    Plan:   Start metformin 500 mg QD, increase dose by 500 mg QD every one-two weeks as tolerated   Check labs for BMP, lipid panel, and urine microalbumin   Refer to nutrition and ophthalmology for lifestyle counseling and retinal exam, respectively   Follow up in four weeks to reassess her progress with lifestyle changes and tolerance of metformin, and discuss initiation of statin    # HTN  Amanda Cervantes HTN appears to be stable, with a presenting blood pressure of 120/82.    Plan:   Continue monitoring on current regimen and consider tapering blood pressure medications if the blood pressure persists or goes even lower, or if there are other indications for medication changes.    #  Rash  The differential for Amanda Cervantes rash remains arthropod bites vs urticarial response to some kind of trigger. Most notably, her rash appears to be recurrent, with symptoms persisting since August. As she has made some attempts to minimize arthropod exposure by treating her cat for fleas and checking her home for bed bugs, and the lesions continue to appear across seasons, I am more inclined to explore the possibility that this is some kind of allergic or pseudoallergic reaction. In particular, Amanda Cervantes reports a temporal association between eating out and the appearance of the rash, which would be consistent with a hypersensitivity reaction to something in the food. Her chili pepper sensitivity test was negative, but there are many other potential allergens that she could be exposed to in such a way. She might also have an allergy to a particular item of clothing that she  wears when she goes out, or perhaps could even be exhibiting a pseudoallergic reaction to an NSAID that she takes for leg pain that comes on when she goes out. The spectrum of allergens is broad, and it would be worthwhile for her to note in more detail what particular foods trigger this response. As she is not having a more intensive anaphylactic reaction, I do not think she is currently a candidate for broad allergen testing. In terms of more serious syndromes that may present with recurrent urticaria, she is unlikely to have SLE as she exhibits no other signs of the illness (fevers, weight loss, worsening arthritis, lymphadenopathy, renal/pulmonary/cardiac problems). Her lesions darken but do not bruise, and the papules are itchy rather than painful, so this is also unlikely to be urticarial vasculitis.    Plan:   Encourage her to take more careful note of the circumstances surrounding her next eruption, the specific foods that seem to trigger the response and any other details of the situations that could help distinguish the allergen.   Continue managing symptoms with Claritin, and only use the betamethasone cream for particularly symptomatic lesions to avoid steroid-induced skin atrophy.    # Sadness  According to the PHQ-9, Amanda Cervantes can be classified as exhibiting moderate depression. She reports frequent depressed mood and almost daily anhedonia, as well as insomnia and loss of energy. She also reports some appetite changes, difficulty concentrating, and psychomotor depression. Based on the DSM-V criteria, she meets the classification of having major depressive disorder, although many of her symptoms are mild. Amanda Cervantes precise family history of mental illness is unknown, but she certainly experiences some depressive stressors in her life. Psychologically, she has minimal time and energy for rest and recreation due to her grueling job. Socially, she has been left to her own devices in the [place], trying to  maintain a long-distance marriage and play the role of mother to her children in two different countries. Amanda Cervantes has no known history of manic or hypomanic episodes, but a more in-depth evaluation could uncover such nuances to clarify her diagnosis. Regardless, it is clear that Amanda Cervantes has a very stressful life, which has had a significant impact on her mood.    Plan:   Discuss the possibility of referrals to case management  at her next visit, as they might be able to help her with some of the decisions that she has to make regarding immigration and moving to [place], and could also more thoroughly assess her psychiatric health and suggest treatment.    # Vitamin D deficiency  Remote history of deficiency and supplementation, but most recent reading in 2016  and no repletion in several months.    Plan:   Check vitamin D level, and adjust repletion regimen accordingly.    [facility] - uptodate on cervical, breast & colon cancer screening, reviewed  Patient was seen in concert with Loetta Rough, HMS II    face-to-face 40 minutes with >50% of the visit  counseling regarding above issues

## 2017-04-04 ENCOUNTER — Telehealth (HOSPITAL_BASED_OUTPATIENT_CLINIC_OR_DEPARTMENT_OTHER): Payer: Self-pay

## 2017-04-04 NOTE — Progress Notes (Signed)
Called Trenia today with [place] interpreter to discuss metformin dosing  She says she has been tolerating the medication well, no N/V, abdominal pain, rashes, headaches  Does describe feeling dizzy for a few seconds when she bends over, notes that this has been consistent since she has started on the metformin and she notices it every time she bends  Also started on the cholecalciferol, takes it daily and reports no problems    Let her know that metformin is usually increased by 500 mg QD as tolerated every 1-2 weeks  Since she is noticing this dizziness, but not having more severe symptoms of medication intolerance, advised her to continue at 500 mg for now while I confirm the best way forward    My plan:  - call her back next week to check how she is adjusting to the metformin  - will suggest then that she trial 1000 mg QD metformin and check if this worsens the dizziness, but would first like to see if her body adjusts to the lower dose with a little more time    [employer], 04/04/2017, 12:43 PM  [facility]-2  Pager [phone]

## 2017-04-09 ENCOUNTER — Ambulatory Visit (HOSPITAL_BASED_OUTPATIENT_CLINIC_OR_DEPARTMENT_OTHER): Payer: Medicaid Other | Admitting: Medical

## 2017-04-14 ENCOUNTER — Telehealth (HOSPITAL_BASED_OUTPATIENT_CLINIC_OR_DEPARTMENT_OTHER): Payer: Self-pay

## 2017-04-14 NOTE — Progress Notes (Signed)
Called Jayliah today with [place] interpreter  She reports doing well, continues to feel some lightheadedness when she bends, otherwise a little abdominal pain that resolves with bowel movements  Reassured her that these are not due to the metformin, and she should not worry too much, but should call me if any sensations worsen  She is aware of nutrition appointment on Tuesday, but would like to reschedule to Thursday pm as that's when she has time off work - I will reschedule for her tomorrow and call her with update  She also agrees to begin taking the metformin twice a day, 500 mg in the morning and 500 mg more in the evening  Knows to call me with any new symptoms or discomfort    Devoria Glassing, 04/14/2017, 2:22 PM  [facility]-2  Pager [phone]

## 2017-04-15 MED ORDER — METFORMIN HCL 500 MG PO TABS
500.0000 mg | ORAL_TABLET | Freq: Two times a day (BID) | ORAL | 11 refills | Status: DC
Start: 2017-04-15 — End: 2017-04-22

## 2017-04-15 MED ORDER — METFORMIN HCL 500 MG PO TABS: 500 mg | tablet | Freq: Two times a day (BID) | ORAL | 11 refills | 0 days | Status: DC

## 2017-04-16 ENCOUNTER — Ambulatory Visit (HOSPITAL_BASED_OUTPATIENT_CLINIC_OR_DEPARTMENT_OTHER): Payer: Medicaid Other | Admitting: Registered"

## 2017-04-18 ENCOUNTER — Ambulatory Visit (HOSPITAL_BASED_OUTPATIENT_CLINIC_OR_DEPARTMENT_OTHER): Payer: Medicaid Other | Admitting: Registered"

## 2017-04-22 ENCOUNTER — Ambulatory Visit (HOSPITAL_BASED_OUTPATIENT_CLINIC_OR_DEPARTMENT_OTHER): Payer: Medicaid Other | Admitting: Internal Medicine

## 2017-04-22 ENCOUNTER — Encounter (HOSPITAL_BASED_OUTPATIENT_CLINIC_OR_DEPARTMENT_OTHER): Payer: Self-pay | Admitting: Internal Medicine

## 2017-04-22 VITALS — BP 118/76 | HR 84 | Temp 97.9°F | Ht 62.5 in | Wt 201.0 lb

## 2017-04-22 DIAGNOSIS — E785 Hyperlipidemia, unspecified: Secondary | ICD-10-CM

## 2017-04-22 DIAGNOSIS — E1169 Type 2 diabetes mellitus with other specified complication: Secondary | ICD-10-CM | POA: Insufficient documentation

## 2017-04-22 DIAGNOSIS — E1165 Type 2 diabetes mellitus with hyperglycemia: Principal | ICD-10-CM

## 2017-04-22 MED ORDER — METFORMIN HCL 500 MG PO TABS: 500 mg | tablet | Freq: Two times a day (BID) | ORAL | 11 refills | 0 days | Status: DC

## 2017-04-22 MED ORDER — ATORVASTATIN CALCIUM 10 MG PO TABS: 10 mg | tablet | Freq: Every day | ORAL | 12 refills | 0 days | Status: AC

## 2017-04-22 MED ORDER — METFORMIN HCL 500 MG PO TABS
500.0000 mg | ORAL_TABLET | Freq: Two times a day (BID) | ORAL | 11 refills | Status: DC
Start: 2017-04-22 — End: 2017-05-17

## 2017-04-22 MED ORDER — ATORVASTATIN CALCIUM 10 MG PO TABS
10.0000 mg | ORAL_TABLET | Freq: Every day | ORAL | 12 refills | Status: DC
Start: 2017-04-22 — End: 2018-04-21

## 2017-04-22 MED ORDER — CHOLECALCIFEROL 25 MCG (1000 UT) PO TABS
2000.0000 [IU] | ORAL_TABLET | Freq: Every day | ORAL | 5 refills | Status: DC
Start: 2017-04-22 — End: 2018-04-21

## 2017-04-22 MED ORDER — CHOLECALCIFEROL 1000 UNITS PO TABS: 2000 [IU] | tablet | Freq: Every day | ORAL | 5 refills | 0 days | Status: AC

## 2017-04-24 NOTE — Progress Notes (Signed)
CC  Follow-up on management of newly diagnosed T2DM and HLD    HPI  Amanda Cervantes is a 52 year old female with a history of T2DM, HTN, obesity, and smoking who presents to follow-up on diabetes management and recent labs.    [name] has been doing well since she was last seen about 1 month ago.  She notes that when she first began taking the metformin, she experienced significant stomach discomfort, including pain and diarrhea.  She reports that taking the pill after breakfast significantly relieved these symptoms, and she feels much better now with only a few bellyaches and infrequent episodes of diarrhea.  She increased to 500 mg twice daily a little over a week ago, but notes that she has been forgetting to take the second evening dose because it has not yet become part of her routine.  She denies financial barriers to getting the medication, but does request greater quantities to be prescribed at a time to reduce trips to the pharmacy.  She also has a nutrition appointment coming up this Thursday, which she is apprehensive about.  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    In terms of lifestyle changes, she has been trying to cook more food at home and eat more healthily.  She reports that this has also helped her avoid exacerbating the rash that she has been experiencing for several months.  She continues to take Claritin and use betamethasone cream, which she requires to control the symptoms, as a recent episode of coming off Claritin led to the skin lesions coming back.  With regards to her mood, she continues to feel stressed in light of her family situation and her husbands deportation, but she is doing her best to remain optimistic.  She does not currently desire to be referred to a  caseworker.  Otherwise, she continues to take her medications as prescribed.  No recent colds, fevers, chills, chest pain, shortness of breath.    PMH   Left leg and knee pain  o Musculoskeletal pain x 10+ years, knee strengthening exercises have helped  o 2/14 initially had left knee pain  o 10/14 seen for Months of right leg pain -- hard to pin down an etiology -- suspect musculoskeletal due to work & obesity, tx with NSAID's & referred to PT  o 12/15 PT not helpful; now reports leg pain interfering with her life, has stopped working, not very active, pain appears to be musculoskeletal but neurologic is possible, referred to physiatry; use acetaminophen prn  o 4/16 Physiatry: bilateral knee osteoarthritis, left worse than right. Her pain has improved considerably with exercise and occasional medication. She also found that the injection also was beneficial. At this point it is too soon to repeat the injection.   o 12/16 bilateral knee osteoarthritis, left worse than right, left greater trochanteric bursa region pain; s/p corticosteroid injection   o 1/19: continues to have leg pain, left leg worse than right, starting behind the knee and extending down the back of the leg to the foot; pain is constant, jolting pain with movement, causes limping and difficulty walking up hills, worse with movement and feels like it is falling asleep. Pain is worst at the lateral aspects of the knees. Tylenol/ibuprofen help with the pain as before,  but she is unhappy that she has to keep taking medications.   Plantar plate injury, left second toe  o 11/17: presented to podiatry with 1 year of left foot pain while walking, found to have plantar plate injury  o 4/25: surgery to repair plantar plate injury, has been recovering well since   Hand cramps  o Made worse by repetitive movements when she was doing a lot of vacuuming at work, has improved since she stopped vacuuming   History of colonic polyps and diverticulitis  o 9/17  diagnosis of diverticulitis in ED following presentation for abdominal pain  o 11/17 colonoscopy, one diminutive polyp in the transverse colon, removed +adenomatous, diverticulosis in the ascending colon. GI recommended 5 yr follow up; but given one small polyp, 10 years might also be reasonable option   Vitamin D deficiency  o Remote history of vitamin D supplementation with 50,000 IU once a week, ran out of pills a few months ago and has not been taking more    Health Maintenance   Up-to-date on medications and screens    Allergies  Penicillins  Comment: Took skin 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 & was positive, never actually took it    Medications    cholecalciferol (VITAMIN D3) 1000 UNIT tablet, Take 1 tablet by mouth daily, Disp: 60 tablet, Rfl: 5    metFORMIN (GLUCOPHAGE) 500 MG tablet, Take 1 tablet by mouth 2 (two) times daily with meals, Disp: 60 tablet, Rfl: 11    chlorthalidone (HYGROTEN) 25 MG tablet, Take 1 tablet by mouth daily, Disp: 90 tablet, Rfl: 12    lisinopril (PRINIVIL,ZESTRIL) 40 MG tablet, Take 1 tablet by mouth daily, Disp: 90 tablet, Rfl: 12    betamethasone dipropionate (DIPROSONE) 0.05 % cream, Apply topically 2 (two) times daily as needed, Disp: 45 g, Rfl: 0    Also takes PRN ibuprofen and Tylenol for leg pain, and Claritin for rash.    Social History  Alcohol: occasionally drinks 1-2 beers on the weekends  Tobacco: used to smoke 1 pack every 3-4 days for 10-15 years, stopped 4 years ago  Drugs: never  Chemicals: standard cleaning chemicals like Windex, Clorox, nothing more toxic    Family History  Problem Relation Age of Onset    Heart Disease Father         died age 82 from MI; learned about HTN in 68's    Cancer - Other Mother         stomach cancer, died    GI Sister         hernia    Thyroid Sister      Review of Systems  Constitutional: As per HPI  Skin: As per HPI  Eyes: As per HPI   Respiratory: As per HPI  Cardiovascular: As per HPI  GI: As per HPI  Psych: As per HPI    Vital  Signs  BP 118/76 (Site: RA, Position: Sitting, Cuff Size: Lrg)  Pulse 84  Temp 97.9 F (36.6 C) (Oral)  Ht 5' 2.5" (1.588 m)  Wt 91.2 kg (201 lb)  LMP 03/28/2017 (Approximate)  BMI 36.18 kg/m2    Physical Exam   General Appearance: Well appearing and comfortable, noticeably overweight, appears stated age, sitting in clinic, not in acute distress.     Neck:     The neck is supple. There is no supraclavicular, submandibular, or cervical lymphadenopathy.  Thyroid was palpated and is slightly symmetrically enlarged with no notable nodularity.  The trachea is in the  midline.    Cardiovascular:  Normal S1 and S2. No murmurs, rubs, or gallops. No JVD.     Pulmonary:  Both lung fields clear to auscultation. No wheezes, crackles, or rhonchi. No use of accessory muscles.     Abdomen:   Normoactive bowel sounds and no abdominal bruits. Tympanic to percussion in 4 quadrants. On palpation, abdomen is soft, non-tender and non-distended without guarding or rigidity. There were no palpable masses or hepatosplenomegaly.    Labs  Office visit on 03/25/2017  Component Value    SODIUM 141     POTASSIUM 3.4*    CHLORIDE 103     CARBON DIOXIDE 26     ANION GAP 12     CALCIUM 10.1     Glucose Random 113     BUN (UREA NITROGEN) 13     CREATININE 0.8     ESTIMATED GLOMERULAR FIL* > 60     Cholesterol 186     TRIGLYCERIDES 156*    HIGH DENSITY LIPOPROTEIN 41     LOW DENSITY LIPOPROTEIN * 123     VITAMIN D,25 HYDROXY 17*    HEMOGLOBIN A1C 7.9*    ESTIMATED AVERAGE GLUCOSE 180*    CREATININE RANDOM URINE 128     ALBUMIN URINE RANDOM 1.1     ALB/CREAT RATIO URINE RAN 9      Assessment and Plan  Pasty D Dapper is a 52 year old female with a history of T2DM, HTN, obesity, and smoking who presents to follow-up on diabetes management and recent labs.    # Diabetes  Lucineas repeat hemoglobin A1c of 7.9 confirms her diagnosis of diabetes.  It appears that she has been tolerating metformin reasonably well, although it has taken  her a little bit of time to adjust both to the abdominal side effects and to the medication administration schedule.  At the moment, since she has not been taking 1000 mg daily consistently, it is important to ensure that she can adhere to the regimen before increasing the dosage further.  If no reminders such as phone alarms are able to help Reka adhere to the regimen, it may be worth discussing the possibility of using extended release metformin instead, though this would be a more expensive option.  Otherwise, it appears that [place] area has made some mild lifestyle changes regarding her diet, and her weight today is 3 kg lighter than 1 month ago.  She is willing to attend the nutrition appointment on Thursday, so hopefully that will provide her some further ideas to improve her lifestyle and achieve more substantial weight loss.    Plan:   Continue metformin 500 mg BID for the next week, follow-up and increase the dose next week   Prescribe a larger quantity of 500 mg pills today such that she does not have to go to the pharmacy as frequently   Follow-up after the nutrition appointment to reinforce lifestyle recommendations   See her back in the office in 4 weeks to reassess her tolerance of metformin and monitor lifestyle changes    # HLD  Deniece is 51 and has T2DM with LDL>70 (123), and her 10-year CV risk is 11.35% by the 2008 Framingham calculator, so she qualifies for primary CVD prevention. Specifically, she should start on a moderate-dose statin.  She was counseled on the safety and benefits of statins, as well as their side effects, such as myalgias, myositis, and rhabdomyolysis.  She was notified that she would likely need to remain on these  medications for many years in order for them to be beneficial.  She confirmed that she would like to start on a statin and agreed to call if she notices any muscle pains or changes in her urine.  She was also further counseled on the effects of diet and  obesity on the risk of atherosclerotic vascular disease.    Plan:   Start on atorvastatin 10 mg QD, with increase to 20 mg if it is tolerated   See her back in the office in 4 weeks to assess tolerance and increase the dose   Plan to wait until a further month passes after the next visit to repeat lipid panel and assess for statin effect, as this will also be the time to repeat her Hg A1c and would thus reduce the number of blood draws needed    Patient was seen in concert with Loetta Rough, HMS II

## 2017-04-25 ENCOUNTER — Ambulatory Visit (HOSPITAL_BASED_OUTPATIENT_CLINIC_OR_DEPARTMENT_OTHER): Payer: Medicaid Other | Admitting: Registered"

## 2017-04-25 VITALS — Wt 202.0 lb

## 2017-04-25 DIAGNOSIS — E1165 Type 2 diabetes mellitus with hyperglycemia: Principal | ICD-10-CM

## 2017-04-25 NOTE — Patient Instructions (Signed)
1. Try using the step counter on your phone to increase exercise.   2. Try decaf coffee or herbal tea to see if it helps your sleep.   3. Could try plain yogurt with fruit if you want.

## 2017-04-25 NOTE — Progress Notes (Signed)
INITIAL NUTRITION DIABETES ASSESSMENT / INTERVENTION    Beginning Time: 1:23 PM  End Time: 2:29 PM     Total Minutes: 60 minutes      Amanda Cervantes is a 52 year old female who presents with new dx T2DM.  Visit via [place] interpreter, via phone. Ethnicity: [place].  Patient's language is [place]. Patient works Aeronautical engineer, every day, sometimes weekends too, leaves house around 6 am and back around 6 pm. Patient lives with 2 friends .     Subjective (including lifestyle changes): Patient reports dx of DM last month. Trying to avoid starch and sugar, has stopped eating [employer] or Brendolyn Patty for lunch. Has stopped juice and soda.     Family History   Problem Relation Age of Onset    Heart Disease Father         died age 37 from MI; learned about HTN in 70's    Cancer - Other Mother         stomach cancer, died    GI Sister         hernia    Thyroid Sister          Patient has had Medical Nutrition Therapy with this service in the past, for pre-DM.     Current Outpatient Medications:  cholecalciferol (VITAMIN D3) 1000 UNIT tablet Take 2 tablets by mouth daily Disp: 60 tablet Rfl: 5   atorvastatin (LIPITOR) 10 MG tablet Take 1 tablet by mouth daily Disp: 30 tablet Rfl: 12   metFORMIN (GLUCOPHAGE) 500 MG tablet Take 1 tablet by mouth 2 (two) times daily with meals Disp: 60 tablet Rfl: 11   chlorthalidone (HYGROTEN) 25 MG tablet Take 1 tablet by mouth daily Disp: 90 tablet Rfl: 12   lisinopril (PRINIVIL,ZESTRIL) 40 MG tablet Take 1 tablet by mouth daily Disp: 90 tablet Rfl: 12   betamethasone dipropionate (DIPROSONE) 0.05 % cream Apply topically 2 (two) times daily as needed Disp: 45 g Rfl: 0   ergocalciferol (VITAMIN D2) 50000 UNIT capsule Take 1 capsule by mouth once a week. Disp: 4 capsule Rfl: 2     No current facility-administered medications for this visit.     Vitamins/Minerals/Herbal Supplements: per outpatient prescriptions above    Social History    Tobacco Use      Smoking status: Former  Smoker        Packs/day: 0.33        Years: 10.00        Pack years: 3.3        Types: Cigarettes        Quit date: 10/15/2012        Years since quitting: 4.5      Smokeless tobacco: Never Used      Tobacco comment: quit 3 months ago on 10/15/12    Alcohol use: Yes      Comment: minimal      Review of Patient's Allergies indicates:   Penicillins                 Comment:Took skin 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 & was positive, never             actually took it    Labs reviewed: Yes    Blood Glucose Monitoring   Frequency: Does not have a glucometer      Vitals:  Most Recent BP Reading(s)  04/22/17 : 118/76            Most Recent Height Reading(s)  04/22/17 :  5' 2.5" (1.588 m)          Most Recent Weight Reading(s)  04/25/17 : 91.6 kg (202 lb)       Weight change: Lost 6 pounds in the past month.     Estimated body mass index is 36.36 kg/m as calculated from the following:    Height as of 04/22/17: 5' 2.5" (1.588 m).    Weight as of this encounter: 91.6 kg (202 lb).   BMI Category: 35-39.9 obesity class II       Physical Activity:  Type: physically demanding work     Barriers to physical activity include: work schedule     Activities of Daily Living: Independent    Chewing ability: no concerns   Swallowing ability: no concerns   Appetite: no concerns     Food Allergies: skin issue when eating out, unclear what exactly she is allergic to. Frustrated with not having a result.  Food Intolerances: None  Religious restrictions/Special diet: None  Food purchased by: self       Food prepared by: self  Meal sources:  home cooked, no restaurant food now because of the allergy  Economic factors and food assist: not asked  Psychosocial factors / Mental Status: interactive   Stress: Fair   Sleep some trouble 11 pm to 5:30 am, sometimes waking up   Readiness to learn: Good     Dietary history / usual intake and Food frequency reveals patient attempts to follow low carbohydrate and no concentrated sweets diet.  She eats 3 meals and 2-3 snacks per  day.  Green juice first thing--spinach, kale, parsley, lemon in the blender, not strained  Breakfast: Omelet  With 2 eggs and tapioca flour and coffee (1/2 whole milk 12/ coffee with 3 spoonfuls of sugar). Trying to avoid coffee right now because she is trying to avoid the sugar. Used to have a croissant with butter in the morning.  Snack: string cheese or yogurt (Activia)  Lunch, 12 pm: soup (carrots, cabbage, onions, chicken, spinach, yam, chayote)  6 pm: coffee with milk and 5-6 saltine crackers  Dinner: soup   Whole milk or fruit (usually 1 if oranges 2) later on     Juice stopped this 1 cup per day   Soda guarana 1-2x/week but stopped this   Water 3 bottles  Tea no   Coffee 2 (1/2 coffee 1/2 milk)  Alcohol rare 1x/month    Portion size is appropriate.  Eating frequency is appropriate  Eating habits are culturally based [place]    Assessment/Plan/Conclusion:  Patient presents with food and nutrition-related knowledge deficit related to dietary changes for DM as evidenced by new dx T2DM.     Patient presents with new dx T2DM, weight issue. Has made some positive dietary changes and lost some weight since dx 1 month ago. Eating more fruits and vegetables, less fast food. Diet is generally balanced. Physical activity may be inadequate, as work is active but patient does not do any additional exercise. Reviewed dietary changes to decrease insulin resistance, including decreasing saturated fat, increased activity. Discussed sources of carbohydrates, importance of fiber, importance of balance and consistency with carbohydrate intake. Reviewed plate method. Discussed importance of healthy sleep. Patient agrees to try using the step counter on her phone to assess and increase activity. Talked about barriers to sustaining healthy changes. Will assess progress, address barriers, and answer questions upon f/u.    Client's goals: See instruction sheet.    Material provided: DM packet in  Portuguese, Plate method      Follow-up: 6 weeks       Beather Arbour, RD, LDN

## 2017-04-30 ENCOUNTER — Encounter (HOSPITAL_BASED_OUTPATIENT_CLINIC_OR_DEPARTMENT_OTHER): Payer: Self-pay

## 2017-05-03 ENCOUNTER — Telehealth (HOSPITAL_BASED_OUTPATIENT_CLINIC_OR_DEPARTMENT_OTHER): Payer: Self-pay

## 2017-05-07 ENCOUNTER — Ambulatory Visit (HOSPITAL_BASED_OUTPATIENT_CLINIC_OR_DEPARTMENT_OTHER): Payer: Medicaid Other | Admitting: Medical

## 2017-05-17 NOTE — Progress Notes (Signed)
Called Mouna today with [place] interpreter to follow up on her medications  She has been doing very well on the 500 mg metformin BID and 10 mg atorvastatin QD  No side effects - no abdominal pain, no constipation/diarrhea  Found the nutrition appointment very helpful, seeing them again later this month  No longer able to come to the appointment on Monday 3/4, she will schedule followup in April after Dr. Towanda Malkin vacation  In the meantime, advised her to increase to metformin 500 mg TID, two in the morning and one at night  She will continue 10 mg atorvastatin QD until the followup visit  Will call her back in two weeks to reassess metformin, planning to increase to final dose of 1000 mg BID    Devoria Glassing, 05/17/2017, 6:29 PM  HMS-2  Pager [phone]

## 2017-05-20 ENCOUNTER — Ambulatory Visit (HOSPITAL_BASED_OUTPATIENT_CLINIC_OR_DEPARTMENT_OTHER): Payer: Medicaid Other | Admitting: Internal Medicine

## 2017-05-20 MED ORDER — METFORMIN HCL 500 MG PO TABS
500.0000 mg | ORAL_TABLET | Freq: Three times a day (TID) | ORAL | 11 refills | Status: DC
Start: 2017-05-20 — End: 2017-06-06

## 2017-05-20 MED ORDER — METFORMIN HCL 500 MG PO TABS: 500 mg | tablet | Freq: Three times a day (TID) | ORAL | 11 refills | 0 days | Status: DC

## 2017-06-06 ENCOUNTER — Ambulatory Visit (HOSPITAL_BASED_OUTPATIENT_CLINIC_OR_DEPARTMENT_OTHER): Payer: Medicaid Other | Admitting: Registered"

## 2017-06-06 ENCOUNTER — Telehealth (HOSPITAL_BASED_OUTPATIENT_CLINIC_OR_DEPARTMENT_OTHER): Payer: Self-pay

## 2017-06-06 NOTE — Progress Notes (Signed)
Called Amanda Cervantes today to follow up on her diabetes medicines  She has been doing well, tolerating metformin 500 mg 2x in am and 1x in pm well  No abdominal pain, no constipation/diarrhea  Had one isolated incident of abdominal pain 4 days ago which resolved  Doing her best to follow nutritionist's recommendations on diet, denies having any difficulties  Feels ready to increase to goal dose of 2000 mg metformin, sending prescription to pharmacy  Also has no problems with atorvastatin, denies muscle pain or weakness, just having usual pain in her legs  Not ready to make an appointment in April now, will check work schedule and call me back to schedule    [employer], 06/06/2017, 7:07 PM  HMS-2  Pager [phone]

## 2017-06-17 MED ORDER — METFORMIN HCL 500 MG PO TABS: 1000 mg | tablet | Freq: Two times a day (BID) | ORAL | 11 refills | 0 days | Status: AC

## 2017-06-17 MED ORDER — METFORMIN HCL 500 MG PO TABS
1000.0000 mg | ORAL_TABLET | Freq: Two times a day (BID) | ORAL | 11 refills | Status: DC
Start: 2017-06-17 — End: 2018-04-21

## 2017-06-22 ENCOUNTER — Encounter (HOSPITAL_BASED_OUTPATIENT_CLINIC_OR_DEPARTMENT_OTHER): Payer: Self-pay | Admitting: Ophthalmology

## 2017-06-22 ENCOUNTER — Ambulatory Visit (HOSPITAL_BASED_OUTPATIENT_CLINIC_OR_DEPARTMENT_OTHER): Payer: Medicaid Other | Admitting: Ophthalmology

## 2017-06-22 DIAGNOSIS — H524 Presbyopia: Secondary | ICD-10-CM | POA: Insufficient documentation

## 2017-06-22 DIAGNOSIS — E1165 Type 2 diabetes mellitus with hyperglycemia: Principal | ICD-10-CM

## 2017-06-22 DIAGNOSIS — H52203 Unspecified astigmatism, bilateral: Secondary | ICD-10-CM

## 2017-06-22 HISTORY — DX: Unspecified astigmatism, bilateral: H52.203

## 2017-06-22 NOTE — Progress Notes (Signed)
Patient presents with:  Diabetic Eye Exam: AODM II for approximately 3 months. She sayst that the blood sugar is well controlled. 03/25/17. Her last hemoglobin A1c was done 03/25/17 was 7.9 (high). For comprehensive diabetic eye exam.  She had a complete ophthalmologic examination , including dilated fundoscopic examination.  There is no diabetic retinopathy.  There is no neovascularization of the optic nerve, or elsewhere in the retina.  The patient is encouraged to work with the primary care doctor to get good control of her blood sugar.  The implications of diabetes in terms of eye pathologies such as glaucoma, diabetic retinopathy have been discussed with her at length.    Astigmatism with presbyopia. She's given a prescription for glasses.

## 2017-06-22 NOTE — Progress Notes (Signed)
Pt here for DM exam. BS stable    Decreased [facility] near.            HEMOGLOBIN A1C (%)   Date Value   03/25/2017 7.9 (H)   02/25/2017 7.9 (H)   01/09/2016 6.3 (H)       No results found for: POCA1C

## 2018-03-28 ENCOUNTER — Encounter (HOSPITAL_BASED_OUTPATIENT_CLINIC_OR_DEPARTMENT_OTHER): Payer: Self-pay

## 2018-03-28 NOTE — Progress Notes (Signed)
[facility]. Care Coordination Program. Pt outreached to inform about the program and to provide education about prevention and early detection of breast and cervical cancer.  Pt agreed to schedule a mammogram on January 30,2020 at 3:40 pm at [facility].  The patient verbally consented to the following statement:  I understand that [employer] is working with the [facility]) to offer education and assistance to me in order to get important health screening tests. I authorize [place] to share my enrollment and health history information and information about the screening tests, results and follow up services I received, to see if it helped me and to collect data so that the [facility] and [place] can obtain more funding to continue to provide services to patients. I also understand that my information is protected by state and federal laws. Only certain people from the [facility] program at the [facility] will see my information. I can also cancel my consent to share information at any time by contacting the clinical program manager, Dewayne Shorter, RN at [phone]

## 2018-03-31 ENCOUNTER — Other Ambulatory Visit (HOSPITAL_BASED_OUTPATIENT_CLINIC_OR_DEPARTMENT_OTHER): Payer: Self-pay | Admitting: Internal Medicine

## 2018-03-31 DIAGNOSIS — I10 Essential (primary) hypertension: Principal | ICD-10-CM

## 2018-03-31 NOTE — Progress Notes (Signed)
PER Pharmacy, Amanda Cervantes is a 53 year old female has requested a refill of chlorthalidone and lisinopril.      Last Office Visit: 04/22/2017 with Rosalie Doctor  Last Physical Exam: 03/25/2017    A1C due on 06/23/2017  DM EYE EXAM due on 06/23/2018    Other Med Adult:  Most Recent BP Reading(s)  04/22/17 : 118/76        Cholesterol (mg/dL)   Date Value   03/25/2017 186     LOW DENSITY LIPOPROTEIN DIRECT (mg/dL)   Date Value   03/25/2017 123     HIGH DENSITY LIPOPROTEIN (mg/dL)   Date Value   03/25/2017 41     TRIGLYCERIDES (mg/dL)   Date Value   03/25/2017 156 (H)         THYROID SCREEN TSH REFLEX FT4 (uIU/mL)   Date Value   05/04/2014 1.530         No results found for: TSH    HEMOGLOBIN A1C (%)   Date Value   03/25/2017 7.9 (H)       No results found for: POCA1C      No results found for: INR    SODIUM (mmol/L)   Date Value   03/25/2017 141       POTASSIUM (mmol/L)   Date Value   03/25/2017 3.4 (L)           CREATININE (mg/dL)   Date Value   03/25/2017 0.8       Documented patient preferred pharmacies:    [facility], [address]  Phone: [phone] Fax: [phone]

## 2018-04-16 ENCOUNTER — Encounter (HOSPITAL_BASED_OUTPATIENT_CLINIC_OR_DEPARTMENT_OTHER): Payer: Self-pay

## 2018-04-16 NOTE — Progress Notes (Signed)
[facility]. Care Coordination Program. Pt outreached to remind appointment for mammogram.

## 2018-04-17 ENCOUNTER — Ambulatory Visit
Admission: RE | Admit: 2018-04-17 | Discharge: 2018-04-17 | Disposition: A | Discharge status: Home / Self Care | Payer: No Typology Code available for payment source | Attending: Internal Medicine | Admitting: Internal Medicine

## 2018-04-17 ENCOUNTER — Encounter (HOSPITAL_BASED_OUTPATIENT_CLINIC_OR_DEPARTMENT_OTHER): Payer: Self-pay

## 2018-04-17 DIAGNOSIS — Z1231 Encounter for screening mammogram for malignant neoplasm of breast: Secondary | ICD-10-CM | POA: Diagnosis present

## 2018-04-17 DIAGNOSIS — R928 Other abnormal and inconclusive findings on diagnostic imaging of breast: Secondary | ICD-10-CM | POA: Diagnosis not present

## 2018-04-17 DIAGNOSIS — Z1239 Encounter for other screening for malignant neoplasm of breast: Secondary | ICD-10-CM

## 2018-04-18 DIAGNOSIS — R928 Other abnormal and inconclusive findings on diagnostic imaging of breast: Secondary | ICD-10-CM

## 2018-04-18 DIAGNOSIS — Z1231 Encounter for screening mammogram for malignant neoplasm of breast: Principal | ICD-10-CM

## 2018-04-21 ENCOUNTER — Ambulatory Visit: Payer: No Typology Code available for payment source | Attending: Internal Medicine | Admitting: Internal Medicine

## 2018-04-21 ENCOUNTER — Encounter (HOSPITAL_BASED_OUTPATIENT_CLINIC_OR_DEPARTMENT_OTHER): Payer: Self-pay | Admitting: Internal Medicine

## 2018-04-21 VITALS — BP 118/76 | HR 106 | Temp 98.1°F | Wt 200.0 lb

## 2018-04-21 DIAGNOSIS — E559 Vitamin D deficiency, unspecified: Secondary | ICD-10-CM | POA: Diagnosis present

## 2018-04-21 DIAGNOSIS — G8929 Other chronic pain: Secondary | ICD-10-CM | POA: Diagnosis present

## 2018-04-21 DIAGNOSIS — F17200 Nicotine dependence, unspecified, uncomplicated: Secondary | ICD-10-CM | POA: Diagnosis present

## 2018-04-21 DIAGNOSIS — I10 Essential (primary) hypertension: Secondary | ICD-10-CM | POA: Diagnosis present

## 2018-04-21 DIAGNOSIS — Z0000 Encounter for general adult medical examination without abnormal findings: Secondary | ICD-10-CM | POA: Diagnosis not present

## 2018-04-21 DIAGNOSIS — M25562 Pain in left knee: Secondary | ICD-10-CM

## 2018-04-21 DIAGNOSIS — E1165 Type 2 diabetes mellitus with hyperglycemia: Secondary | ICD-10-CM | POA: Diagnosis present

## 2018-04-21 DIAGNOSIS — Z23 Encounter for immunization: Secondary | ICD-10-CM

## 2018-04-21 LAB — LIPID PANEL
Cholesterol: 144 mg/dL (ref 0–239)
HIGH DENSITY LIPOPROTEIN: 37 mg/dL — ABNORMAL LOW (ref 40–?)
LOW DENSITY LIPOPROTEIN DIRECT: 90 mg/dL (ref 0–189)
TRIGLYCERIDES: 164 mg/dL — ABNORMAL HIGH (ref 0–150)

## 2018-04-21 LAB — MICROALBUMIN RANDOM URINE
ALB/CREAT RATIO URINE RAN: 9 ug/mg (ref 0–30)
ALBUMIN URINE RANDOM: 0.7 mg/dL (ref 0.0–1.9)
CREATININE RANDOM URINE: 80 mg/dl

## 2018-04-21 LAB — BASIC METABOLIC PANEL
ANION GAP: 11 mmol/L (ref 5–15)
BUN (UREA NITROGEN): 18 mg/dL (ref 7–18)
CALCIUM: 10.7 mg/dL — ABNORMAL HIGH (ref 8.5–10.1)
CARBON DIOXIDE: 26 mmol/L (ref 21–32)
CHLORIDE: 99 mmol/L (ref 98–107)
CREATININE: 0.8 mg/dL (ref 0.4–1.2)
ESTIMATED GLOMERULAR FILT RATE: >60 mL/min (ref >60)
Glucose Random: 276 mg/dL — ABNORMAL HIGH (ref 74–160)
POTASSIUM: 3.6 mmol/L (ref 3.5–5.1)
SODIUM: 136 mmol/L (ref 136–145)

## 2018-04-21 LAB — POC A1C: POC HEMOGLOBIN A1C: 10.8 % (ref 4–5.6)

## 2018-04-21 LAB — VITAMIN D,25 HYDROXY: VITAMIN D,25 HYDROXY: 30 ng/mL (ref 30.0–100.0)

## 2018-04-21 MED ORDER — METFORMIN HCL ER 750 MG PO TB24: 1500 mg | tablet | Freq: Every evening | ORAL | 11 refills | 0 days | Status: AC

## 2018-04-21 MED ORDER — CHOLECALCIFEROL 25 MCG (1000 UT) PO TABS
2000.0000 [IU] | ORAL_TABLET | Freq: Every day | ORAL | 5 refills | Status: DC
Start: 2018-04-21 — End: 2019-01-15

## 2018-04-21 MED ORDER — CHOLECALCIFEROL 25 MCG (1000 UT) PO TABS: 2000 [IU] | tablet | Freq: Every day | ORAL | 5 refills | 0 days | Status: AC

## 2018-04-21 MED ORDER — METFORMIN HCL ER 750 MG PO TB24
1500.0000 mg | ORAL_TABLET | Freq: Every evening | ORAL | 11 refills | Status: DC
Start: 2018-04-21 — End: 2019-05-11

## 2018-04-21 MED ORDER — ATORVASTATIN CALCIUM 10 MG PO TABS: 10 mg | tablet | Freq: Every day | ORAL | 12 refills | 0 days | Status: AC

## 2018-04-21 MED ORDER — ATORVASTATIN CALCIUM 10 MG PO TABS
10.0000 mg | ORAL_TABLET | Freq: Every day | ORAL | 12 refills | Status: DC
Start: 2018-04-21 — End: 2019-05-11

## 2018-04-21 NOTE — Progress Notes (Signed)
CC: left knee pain, HTN Diabetes follow-up & PE    left  KNEE PAIN    Knee pain began many months ago.    Pain is worse with activity especially going up stairs or bending  No locking or giving out.  She denies recent trauma to knee    Pain is not present at night  She does not report morning stiffness that last more than 30 minutes    She has used OTC analgesics for knee pain with minmal relief.    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    Her last HbA1c was     HEMOGLOBIN A1C (%)   Date Value   03/25/2017 7.9 (H)   02/25/2017 7.9 (H)   01/09/2016 6.3 (H)         HEMOGLOBIN A1C (%)   Date Value   03/25/2017 7.9 (H)   02/25/2017 7.9 (H)   01/09/2016 6.3 (H)       POC HEMOGLOBIN A1C (%)   Date Value   04/21/2018 10.8 (*H)          our primary goal is HgA1c is <8%      She notes FS at home fasting in the AM as not measured   goals are to have avg preprandial glucose between 90-130;     post prandial FS at home are not measured  if preprandial glucose is normal but HgA1c is still above goal then our goal is peak postprandial capillary plasma glucose <180 (measured 1-2 hour after starting a meal).    Her Most Recent Weight Reading(s)  04/21/18 : 90.7 kg (200 lb)  04/17/18 : 91.6 kg (202 lb)  04/25/17 : 91.6 kg (202 lb)  04/22/17 : 91.2 kg (201 lb)  03/25/17 : 94.3 kg (208 lb)    She denies polyuria, polydipsia, blurry vision, chest pain, dyspnea or claudication.  No foot burning, numbness or pain.       She denies feeling lightheaded or tremulous.    Please see EPIC RHM field for last visit to ophtho & podiatry.    Her last microalbumin was:   ALB/CREAT RATIO URINE RAN (ug/mg)   Date Value   03/25/2017 9     last potassium was   POTASSIUM (mmol/L)   Date Value   03/25/2017 3.4 (L)       last creatinine was   CREATININE (mg/dL)   Date Value   03/25/2017 0.8     last    ESTIMATED GLOMERULAR FILT RATE (ML/MIN)   Date Value   03/25/2017 > 60       She reports NOT taking medicines as below - she found that when she took the metformin during the morning it was making her feel sick at work - so she completely stopped  She is prescribed metformin and the dose is not greater than the maximum dose recommended given her renal function as follows:   GFR >60, 2500 mg;   GFR 45-59, 2000 mg;   GFR 30-44, 1000 mg; reassess need for metformin on yearly basis  GFR <30, avoid   (based on recommendations in JAMA 2014;312:2668).    Her goal LDL is to be on a moderate dose statin, her last LDL was   LOW DENSITY LIPOPROTEIN DIRECT (mg/dL)   Date Value  03/25/2017 123         Her goal BP is <140/85 based on my interpretation of the following trials of BP control in DM pts: ABCD, HOT, UKPDS & ACCORD (NEJM [id number]), her last BP was Most Recent BP Reading(s)  04/21/18 : 118/76  04/22/17 : 118/76     Patient Active Problem List    Rash         Priority: High [1]         Date Noted: 10/31/2016            8/18 derm: Arthropod bite, initial encounter             (primary encounter             diagnosis)            - flea bites (cat) v. Bed bugs v. 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            - still looks most c/w arthropod bite but given            history of flares after certain foods and that did            not improve with treating cat for fleas (and no            bed bugs found in apt), will refer to allergy for            testing            For now I recommend:            - referral to allergy to r/o urticaria due to            systemic food allergy - in the meantime continue            claritin daily, can use topical steroid for prn             relief                        1/19: Most recently, had a flare of the rash after            eating at a friends place for Christmas,            continues to think this is a food allergy. Uses             betamethasone cream as well as Claritin, which            help resolve blistering and redness, leaving            hyperpigmented spots.      History of colonic polyps         Priority: High [1]         Date Noted: 02/15/2016            11/17 colonoscopy,                  - One diminutive polyp in the transverse            colon, removed +adenomatous     - Diverticulosis            in the ascending colon.GI recommended 5 yr follow  up; but given one small polyp, 10 years might             also be reasonable option      Headache disorder         Priority: High [1]         Date Noted: 07/13/2015            4/17 ED visit for headache, CT head wnl      Thumb pain         Priority: High [1]         Date Noted: 07/14/2014            4/16 when at physiatry: her thumb pain, I asked            that she discuss this further with Dr. Gavin Pound as this appears to be mostly a vesicle             within the subcutaneous layer.       Left leg pain         Priority: High [1]         Date Noted: 01/15/2013            2/14 initially had left knee pain            10/14 seen for Months of right leg pain -- hard to            pin down an etiology --             suspect musculoskeletal due to work & obesity             tx with NSAID's & referred to PT                        12/15 PT not helpful; now reports leg pain            interfering with her life, has             stopped working, not very active            Pain appears to be musculoskeletal but neurologic            is possible            Referred to physiatry; use acetaminophen prn                        4/16 physiatry: bilateral knee osteoarthritis,            left worse than right. Her pain has improved            considerably with exercise and occasional            medication. She also found that the injection also            was beneficial. At this point it is             too soon to repeat the injection.             12/16 bilateral knee  osteoarthritis, left worse            than right, left greater trochanteric bursa region            pain. ; s/p corticosteroid injection  1/19: continues to have leg pain, left leg worse            than right, starting behind the knee and extending            down the back of the leg to the foot; pain is            constant, jolting pain with movement, causes            limping and difficulty walking up hills, worse            with movement and feels like it is falling asleep.            Tylenol/ibuprofen help with the pain as before,            but she is unhappy that she has to keep taking            medications.      Diabetes type 2, uncontrolled ([place])         Priority: High [1]         Date Noted: 10/31/2011            8/13 HgA1c 6%            8/14 HgA1c 6.3%                        1/16 into DM range 6.8%            10/17 back to pre-DM range, 6.3%            12/18 HgA1c 7.9%                        New dx - needs to get started on management &            discuss dx                        03/25/17: HgA1c confirmed 7.9%, started on metformin            500 mg QD and referred             to nutrition             4/19 ophtho wnl      Obesity (BMI 30-39.9)         Priority: High [1]         Date Noted: 07/25/2009            Took compounded diet pills from [place] -- in 2009,            took for 30 days                        Feb 2016 - 3rd mtg with RD. Strong knowledge of            what to eat. Always come to appt on self made            diet. Struggles to adhere d/t lack of family            agreement and patient "burn out" for cooking            separate meals. Discussed methods to blend both            dietary patterns and still see weight loss / a1c  improvement            3/16 she's been doing a lot of hard work to lose            weight in a healthy manner, has seen RD, not            thinking that returning is necessary at this             point      Essential hypertension,  benign         Priority: High [1]         Date Noted: 07/25/2009            BMP, HCT, microalb, EKG wnl 2011; Treatment            started 7/27/13Well controlled with            chlorthalidone, had one low K+, so changed to             lisinopril              6/ 2016 BP not well controlled on lisinopril --            added back chlorthalidone             7/16 BP well controlled      Diverticulitis of large intestine without perforation or abscess without bleeding         Priority: Medium [2]         Date Noted: 01/09/2016            9/17 dx in ED following presentation for abd pain            11/17 seen on colonoscopy       Tobacco dependence         Priority: Medium [2]         Date Noted: 10/29/2011            Quit 7/13            2/14 continues to smoke a few cigarettes when she            goes out with friends             drinking            Quit 6/14      Plantar plate injury, left, initial encounter         Priority: Low [3]         Date Noted: 03/23/2016      Metatarsal deformity, left         Priority: Low [3]         Date Noted: 03/23/2016      Left knee pain         Priority: Low [3]         Date Noted: 05/12/2012            musculoskeletal pain x 10+ years            Knee strengthening exercises have helped                        10/14 saw Dr. Ursula Alert for bilateral knee pain &            referred to PT      Astigmatism of both eyes with presbyopia         Date Noted: 06/22/2017      Hyperlipidemia associated with type  2 diabetes mellitus ([place])         Date Noted: 04/22/2017      Vitamin D deficiency         Date Noted: 03/27/2017            03/25/17: vitamin D level at 73, will start with            1000 IU QD vitamin D3             supplementation       Pixie [facility] D   Home Medication Instructions [id number]    Printed on:04/21/18 1133   Medication Information                      atorvastatin (LIPITOR) 10 MG tablet  Take 1 tablet by mouth daily             chlorthalidone (HYGROTEN) 25 MG  tablet  Take 1 tablet by mouth daily             cholecalciferol (VITAMIN D3) 1000 UNIT tablet  Take 2 tablets by mouth daily             lisinopril (ZESTRIL) 40 MG tablet  Take 1 tablet by mouth daily             metFORMIN (GLUCOPHAGE-XR) 750 MG 24 hr tablet  Take 2 tablets by mouth nightly               Review of Patient's Allergies indicates:   Penicillins                 Comment:Took skin 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 & was positive, never             actually took it  Past Medical History:  06/22/2017: Astigmatism of both eyes with presbyopia  No date: HTN (hypertension)  Past Surgical History:  No date: LAPAROSCOPY FULGURATION OVIDUCTS  No date: OB ANTEPARTUM CARE CESAREAN DLVR & POSTPARTUM  No date: TUBAL LIGATION  Social History     Socioeconomic History    Marital status: Legally Separated     Spouse name: Not on file    Number of children: Not on file    Years of education: Not on file    Highest education level: Not on file   Occupational History    Not on file   Social Needs    Financial resource strain: Not on file    Food insecurity:     Worry: Not on file     Inability: Not on file    Transportation needs:     Medical: Not on file     Non-medical: Not on file   Tobacco Use    Smoking status: Former Smoker     Packs/day: 0.33     Years: 10.00     Pack years: 3.30     Types: Cigarettes     Last attempt to quit: 10/15/2012     Years since quitting: 5.5    Smokeless tobacco: Never Used    Tobacco comment: quit 3 months ago on 10/15/12   Substance and Sexual Activity    Alcohol use: Yes     Comment: minimal    Drug use: No    Sexual activity: Yes     Partners: Male     Birth control/protection: Tubal Ligation   Lifestyle    Physical activity:     Days per week: Not on file     Minutes  per session: Not on file    Stress: Not on file   Relationships    Social connections:     Talks on phone: Not on file     Gets together: Not on file     Attends religious service: Not on file     Active member of club or  organization: Not on file     Attends meetings of clubs or organizations: Not on file     Relationship status: Not on file    Intimate partner violence:     Fear of current or ex partner: Not on file     Emotionally abused: Not on file     Physically abused: Not on file     Forced sexual activity: Not on file   Other Topics Concern    Not on file   Social History Narrative    she is originally from [place] ,[place].  She came to the [place] in 2005     In [place], she worked as a Musician.     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    She lives with boyfriend & daughter; no DV;      she has 2 children, age 53 y/o son in [place] & 19 y/o daughter lives with her     Review of patient's family history indicates:  Problem: Heart Disease      Relation: Father          Age of Onset: (Not Specified)          Comment: died age 53 from MI; learned about HTN in 64's  Problem: Cancer - Other      Relation: Mother          Age of Onset: (Not Specified)          Comment: stomach cancer, died  Problem: GI      Relation: Sister          Age of Onset: (Not Specified)          Comment: hernia  Problem: Thyroid      Relation: Sister          Age of Onset: (Not Specified)     BP 118/76 (Site: RA, Position: Sitting, Cuff Size: Lrg)  Pulse 106  Temp 98.1 F (36.7 C) (Oral)  Wt 90.7 kg (200 lb)  SpO2 98%  BMI 36 kg/m2  General:Patient appears well, alert and oriented x 3, pleasant, cooperative.   Eyes: anicteric conjunctiva, PERLA bilaterally.   Ears: tm's wnl bilaterally without erythema or exudate.   Oropharynx: no erythema or exudate.   Neck supple and free of adenopathy, or masses. No thyromegaly.  Lymph: no enlargement of anterior cervical, posterior cervical or supraclavicular lymph nodes   Cardiovascular: regular rate & rhythm, no murmurs, gallops or rubs appreciated.   Chest: clear to auscultation bilaterally, no crackles, rhonchi or wheezes.   Abdomen is obese, soft, no tenderness, masses  or organomegaly.  Extremities: no clubbing, cyanosis or edema.    Skin is normal without suspicious lesions noted.  Mental status exam: Normal thought content, speech, affect, mood and dress are noted.  Musculoskeletal exam: within normal limits without joint swelling or deformities  Screening neurological exam is normal without focal findings.   Knee exam: full range of motion, no pain on motion, no effusion, tenderness, masses, ligamentous instability or deformity noted.     Foot exam: no swelling, tenderness or skin or vascular lesions. Color  and temperature is normal. Sensation is intact to 5.07 (10-g) monofilament test at large toe, 5th toe, heel and fore foot. Peripheral pulses are palpable. Toenails are normal.     ASSESSMENT & PLAN:  (Z00.00) Routine general medical examination at a health care facility  (primary encounter diagnosis)  Comment: reviewed all routine health maintenance issues       (E11.65) Uncontrolled type 2 diabetes mellitus with hyperglycemia ([place])  Comment: very poorly controlled, fortunately she's not having any symptoms of hyperglycemia yet  We reviewed those & reviewed need to drink a lot of water  Will restart metformin trying long-acting formulation & only taking at night  Will take 750 mg for 1-2 weeks, then after has become accostomed, increase to 1500 mg nightly  This will not likely be enough, but will start with that  follow up with pharmacist in 1 month to make additional adjustments  Also try to lose a bit of weight   In future will call us whenever she stops a medication  Plan: LIPID PANEL, MICROALBUMIN RANDOM URINE,         COLLECTION VENOUS BLOOD VENIPUNCTURE, REFERRAL         TO PHARMACOTHERAPY SERVICES ([facility])            (I10) Essential hypertension, benign  Comment: well controlled continue current regimen   Plan: BASIC METABOLIC PANEL            (F17.200) Tobacco dependence  Comment: quit several years ago!        (E55.9) Vitamin D deficiency  Comment: will repeat level &  refilled scripts  Plan: VITAMIN D,25 HYDROXY            (M25.562,  G89.29) Chronic pain of left knee  Comment: appears to be musculoskeletal pain related to weight, age & work - will start with PT  Plan: REFERRAL TO PHYSICAL THERAPY ( [facility])            (Z23) Need for prophylactic vaccination and inoculation against influenza  Plan: IMMUNIZATION ADMIN SINGLE, IIV4 VACC PRESERV         FREE AGE 21 MONTHS AND OLDER, 0.5ML, IM              The patient was ready to learn and no apparent learning or adherence barriers were identified. I explained the diagnosis and treatment plan, and the patient expressed understanding of the content. I attempted to answer any questions regarding the diagnosis and the proposed treatment.    Possible side effects of the prescribed medication was explained. We discussed the patients current medications.  We discussed the importance of medication compliance. The patient expressed understanding and no barriers to adherence were identified.    follow-up will be scheduled for 1 month from now  she has been advised to call or return with any worsening or new problems

## 2018-04-21 NOTE — Progress Notes (Signed)
04/21/2018  VIS given prior to administration and reviewed with the patient and or legal guardian. Patient understands the disease and the vaccine. See immunization/Injection module or chart review for date of publication and additional information.  Ayana Imhof Youlanda Mighty, RN

## 2018-04-23 ENCOUNTER — Telehealth (HOSPITAL_BASED_OUTPATIENT_CLINIC_OR_DEPARTMENT_OTHER): Payer: Self-pay | Admitting: Internal Medicine

## 2018-04-23 DIAGNOSIS — R899 Unspecified abnormal finding in specimens from other organs, systems and tissues: Principal | ICD-10-CM

## 2018-04-23 DIAGNOSIS — R799 Abnormal finding of blood chemistry, unspecified: Secondary | ICD-10-CM

## 2018-04-23 DIAGNOSIS — E210 Primary hyperparathyroidism: Secondary | ICD-10-CM | POA: Insufficient documentation

## 2018-04-23 NOTE — Progress Notes (Signed)
Barb,   Please let her know we received the blood tests  Main issue is very poorly controlled DM    In addition, very slight abnormal test (calcium), we'll just repeat that in 1-2 months    This is what we discussed 2/3, can reinforce below  Thanks, Bennye Alm     very poorly controlled, fortunately she's not having any symptoms of hyperglycemia yet  We reviewed those & reviewed need to drink a lot of water  Will restart metformin trying long-acting formulation & only taking at night  Will take 750 mg for 1-2 weeks, then after has become accostomed, increase to 1500 mg nightly  This will not likely be enough, but will start with that  follow up with pharmacist in 1 month to make additional adjustments  Also try to lose a bit of weight   In future will call us whenever she stops a medication

## 2018-05-05 NOTE — Progress Notes (Signed)
Called pt with tel interpreter Norio  Reviewed test results. Is taking the metformin and tonight is increasing to 2 tabs as ordered..  Encouraged exercise says is tough during the week but will try walking on weekend.  Will keep appt with pharmacy next month  Encouraged decrease portion size carbs.Has started cutting down since last visit. Asked her not to eliminate rice as she is doing- explained difficult to stick with that but rather decrease portion size.  Says she prefers to do it this way to lose some weight right away.  Explained the purpose of the pharmacy appt. No further question.     Will repeat labs at pharmacy visit

## 2018-05-22 ENCOUNTER — Ambulatory Visit: Payer: No Typology Code available for payment source | Attending: Internal Medicine | Admitting: Pharmacist

## 2018-05-22 VITALS — BP 120/80

## 2018-05-22 DIAGNOSIS — E1165 Type 2 diabetes mellitus with hyperglycemia: Principal | ICD-10-CM | POA: Diagnosis present

## 2018-05-22 DIAGNOSIS — R899 Unspecified abnormal finding in specimens from other organs, systems and tissues: Secondary | ICD-10-CM | POA: Diagnosis present

## 2018-05-22 LAB — BASIC METABOLIC PANEL
ANION GAP: 10 mmol/L (ref 5–15)
BUN (UREA NITROGEN): 16 mg/dL (ref 7–18)
CALCIUM: 10.7 mg/dL — ABNORMAL HIGH (ref 8.5–10.1)
CARBON DIOXIDE: 28 mmol/L (ref 21–32)
CHLORIDE: 102 mmol/L (ref 98–107)
CREATININE: 0.8 mg/dL (ref 0.4–1.2)
ESTIMATED GLOMERULAR FILT RATE: >60 mL/min (ref >60)
Glucose Random: 117 mg/dL (ref 74–160)
POTASSIUM: 3.6 mmol/L (ref 3.5–5.1)
SODIUM: 140 mmol/L (ref 136–145)

## 2018-05-22 MED ORDER — GLUCOSE BLOOD VI STRP: strip | 3 refills | 0 days | Status: AC

## 2018-05-22 MED ORDER — LANCETS
3 refills | Status: AC
Start: 2018-05-22 — End: 2019-05-22

## 2018-05-22 MED ORDER — GLUCOMETER SYSTEM KIT
PACK | 0 refills | Status: AC
Start: 2018-05-22 — End: 2019-05-22

## 2018-05-22 MED ORDER — GLUCOMETER SYSTEM KIT: each | 0 refills | 0 days | Status: AC

## 2018-05-22 MED ORDER — GLUCOSE BLOOD VI STRP
ORAL_STRIP | 3 refills | Status: AC
Start: 2018-05-22 — End: 2019-05-22

## 2018-05-22 MED ORDER — LANCETS: each | 3 refills | 0 days | Status: AC

## 2018-05-22 NOTE — Progress Notes (Signed)
Diabetes Management Initial Visit    Pharmacotherapy tracking for: Diabetes      Subjective:    Translator used for this visit: Yes, [place].    Amanda Cervantes is a 53 year old female with Type 2, uncontrolled DM who presents to clinic for initial diabetes education and management.    Medication reconciliation was completed at the beginning of the visit verbally. Patient reports (+) adherence to metformin XR 1500mg  once daily at bedtime since last seen by her PCP on 04/21/18. Patient reports self discontinuing metformin about 3 months prior to PCP visit due to GI upset. Patient denies GI issues since restarting metformin on 2/3. Marland Kitchen      S/sx of hypoglycemia: denies    S/sx of hyperglycemia: Fatigue/lack of energy. Patient also reports occasional tingling in her hands and feet.    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    Smokes: denies   Alcohol: social      Last ophthalmology exam: 06/24/17 negative for retinopathy. Due April, 2020.    SMBG: Not testing.      Diet:     Breakfast: eggs and/or toast    Lunch/Dinner: veggie soup with rice or pasta, chicken, beef, or pork    Snacks: crackers    Drinks: soda, water, juice, coffee with a "lot" of sugar    Patient report eating less pasta and rice and is no longer adding sugar to her coffee and stopped drinking juice and soda.    Exercise: active as a house cleaner, otherwise non-active.        Objective:        Most Recent BP Reading(s)  04/21/18 : 118/76  04/22/17 : 118/76  03/25/17 : 120/82        Cholesterol   Date Value   04/21/2018 144 mg/dL   03/25/2017 186 mg/dL   10/29/2011 162 mg/dl     LOW DENSITY LIPOPROTEIN DIRECT   Date Value   04/21/2018 90 mg/dL   03/25/2017 123 mg/dL   10/29/2011 101 mg/dl (H)     HIGH DENSITY LIPOPROTEIN   Date Value   04/21/2018 37 mg/dL (L)   03/25/2017 41 mg/dL   10/29/2011 31 mg/dl (L)     TRIGLYCERIDES   Date Value   04/21/2018 164 mg/dL (H)   03/25/2017 156  mg/dL (H)   08/04/2009 99 mg/dl         CREATININE (mg/dL)   Date Value   04/21/2018 0.8   03/25/2017 0.8   11/21/2015 0.8         CREATININE RANDOM URINE   Date Value Ref Range Status   04/21/2018 80 NOT ESTAB mg/dl Final   03/25/2017 128 NOT ESTAB mg/dl Final   10/13/2011 33 NOT ESTAB mg/dl Final     ALBUMIN URINE RANDOM   Date Value Ref Range Status   04/21/2018 0.7 0.0 - 1.9 mg/dL Final   03/25/2017 1.1 0.0 - 1.9 mg/dL Final   10/13/2011 0.3 0.0 - 1.9 mg/dl Final     ALB/CREAT RATIO URINE RAN   Date Value Ref Range Status   04/21/2018 9 0 - 30 ug/mg Final     Comment:             Interpretive Information for Microalbumin  Normal :               < 30 ug Microalbumin / mg Creatinine  Microalbuminuria:  30-300 ug Microalbumin / mg Creatinine  Clinical Albuminuria:  >300 ug Microalbumin / mg Creatinine         No results found for: B12        HEMOGLOBIN A1C (%)   Date Value   03/25/2017 7.9 (H)   02/25/2017 7.9 (H)   01/09/2016 6.3 (H)       POC HEMOGLOBIN A1C (%)   Date Value   04/21/2018 10.8 (*H)           Assessment/Recommendations:  1. Diabetes  a. Last A1c was above goal (< 8% per referral) at 10.8. Increased from last value.  Next due: 07/20/18  b. Medications: continue metformin XR 1500mg  daily at bedtime.   c. SMBG: patient will start testing every other day as an aide to lifestyle changes. Meter ordered and patient will return next week for education.   d. Diet: patient will avoid juice, soda, and sugar,  continue to limit intake of carbs, and increase intake of lean protein and vegetables.   e. Activity: encourage patient to increase activity level to at least 30 min/day via light exercises and walking   f. Last urine albumin/SCr ratio: negative for albuminuria.  ACE-I/ARB? Yes. lisinopril.  Next due: 04/21/18  g. Education:   i. What is diabetes?  ii. S/sx of hyperglycemia  iii. A1c/SMBG goals  iv. Complications of uncontrolled DM  v. Importance of medication and visit compliance  vi. High vs low  carbohydrate food  vii. Diet/exercise as a means of controlling BG  viii. Importance of foot care and how to deal with sick days  ix. Importance of limiting alcohol intake      2. Blood Pressure  a. Blood pressure was at goal (<140/90 per JNC 8 guidelines).   b. Interventions: continue lisinopril 40mg  daily and chlorthalidone 25mg  daily      3. Lipids  a. Per most recent ADA guidelines: If > 30 years old without ASCVD - use moderate intensity statin or consider high if ASCVD risk factors present   b. Last lipid panel revealed LDL 90 mg/dL. Next due: February, 2021  c. Interventions: continue atorvastatin 10mg  daily along with lifestyle changes.         Planned return date: 3/12 at 2pm for meter education.        Provided verbal and written dosing instructions. Patient verbalized understanding of everything discussed and agrees with plan. Patient was given the opportunity to ask questions/voice concerns.    The majority of this visit was spent counseling the patient regarding medication management and chronic disease education.

## 2018-05-23 ENCOUNTER — Other Ambulatory Visit (HOSPITAL_BASED_OUTPATIENT_CLINIC_OR_DEPARTMENT_OTHER): Payer: Self-pay | Admitting: Internal Medicine

## 2018-05-23 DIAGNOSIS — R899 Unspecified abnormal finding in specimens from other organs, systems and tissues: Principal | ICD-10-CM | POA: Insufficient documentation

## 2018-05-23 NOTE — Progress Notes (Signed)
Amanda Cervantes, please let her know that her calcium was a bit high  Nothing to worry about but we need to wait ~3 months then she should drop by for another blood draw.  Thanks, [employer]

## 2018-05-27 NOTE — Progress Notes (Signed)
Telephone call to pt. Left voicemail message on answering machine.

## 2018-05-29 ENCOUNTER — Encounter (HOSPITAL_BASED_OUTPATIENT_CLINIC_OR_DEPARTMENT_OTHER): Payer: Self-pay | Admitting: Pharmacist

## 2018-06-30 ENCOUNTER — Ambulatory Visit (HOSPITAL_BASED_OUTPATIENT_CLINIC_OR_DEPARTMENT_OTHER): Payer: Medicaid Other | Admitting: Ophthalmology

## 2018-07-31 ENCOUNTER — Other Ambulatory Visit (HOSPITAL_BASED_OUTPATIENT_CLINIC_OR_DEPARTMENT_OTHER): Payer: Self-pay | Admitting: Pharmacist

## 2018-07-31 NOTE — Progress Notes (Signed)
This patient was identified as meeting criteria for a pharmacotherapy televisit rather than an in person visit due to public health concerns around COVID-19. A complete assessment and plan is detailed in the note, all of which were conducted remotely using virtual visit technology. Patient identity was verbally confirmed with 2 identifiers at the start of the visit. Patient was located home during the visit. Provider was located in an Ambulatory exam room/outside the office at a secure location during the visit.       Diabetes Management Follow Up Televisit       Subjective:    Translator used for this visit: Yes, [place].    Amanda Cervantes is a 53 year old female with type 2 diabetes who is follow by pharmacotherapy for diabetes management.   Patient reports (+) adherence to all medications.    S/sx of hypoglycemia: denies and shakiness    S/sx of hyperglycemia: denies    Other symptoms/complaints: none      SMBG: Tests occasionally. Patient unable to recall when she last tested, but does recall a BS of 95.     Diet: patient denies change in diet, continues to limit carbs and eat lean protein and vegetables.       Exercise: non-active, lives sedentary lifestyle.  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         Objective:        Most Recent BP Reading(s)  05/22/18 : 120/80  04/21/18 : 118/76  04/22/17 : 118/76        HEMOGLOBIN A1C (%)   Date Value   03/25/2017 7.9 (H)   02/25/2017 7.9 (H)   01/09/2016 6.3 (H)       POC HEMOGLOBIN A1C (%)   Date Value   04/21/2018 10.8 (*H)           Assessment/Recommendations:  Diabetes  a. Medications: continue metformin XR 1500mg  daily  b. SMBG: recommended checking at least once weekly (am fasting or 2 hrs after food) to help as aide to lifestyle changes.  Bring logbook and/or glucometer in to next clinic visit.  c. Diet: commended patient on diet change and encouraged to continue   d. Activity: encouraged patient to increase activity level to at least 61min/day via  walking and light exercises   e. Education topics reviewed:   i. Importance of medication and visit compliance  ii. Diet/exercise as a means of controlling BG  iii. Advised patient to call clinic prior to next appt for med adjustments should they experience readings <70 or >200.         Blood Pressure  Interventions: continue lisinopril 40mg  daily and chlorthalidone 25mg  daily        Patient was asked if any refills were needed/medication list reviewed for refills:     Due to the COVID-19 outbreak, reminded patients of the following:    ? You are at higher risk, due to your diagnosis of DM/HTN.  ? The virus is passed person to person through droplets in the air and can live on surfaces  ? Stay home as much as possible to further reduce your risk of being exposed.  ? Take everyday precautions to keep 6 feet between yourself and others. Limit visitors.   ? When you go out in public, limit close contact and wash your hands often and when returning to the house.   ? Wash your hands frequently with an alcohol based sanitizer, or with soap and water for 20 seconds.  Avoid touching your face.   ? Sneeze or cough into a kleenex, then throw it away and wash your hands.  ? Commonly touched surfaces in the house should be cleaned at least once a day with approved cleaners: handles, table tops, door knobs, surfaces  Additionally, reminded patients to:  ? Please call us with any health questions, refills or other concerns  ? While in person visits are limited to reduce the chance of spreading the virus, televisits are available. We are still here to take care of you.   ? If you start feeling sick (with cough, fever, trouble breathing, sore throat, runny nose, or you suddenly cant smell anymore) call us immediately to be evaluated over the phone for COVID-19.   ? If you have severe symptoms at any point, especially severe trouble breathing, call 911. Let the operator know that you might have [place].        Planned return date:  7/20 at 2pm.     Patient verbalized understanding of everything discussed and agrees with plan. Patient was given the opportunity to ask questions/voice concerns.    Majority of this visit was spent counseling the patient regarding medication management and chronic disease education.    Pharmacotherapy tracking for: Diabetes

## 2018-08-29 ENCOUNTER — Other Ambulatory Visit: Payer: Self-pay

## 2018-08-29 ENCOUNTER — Other Ambulatory Visit (HOSPITAL_BASED_OUTPATIENT_CLINIC_OR_DEPARTMENT_OTHER): Payer: Self-pay

## 2018-08-29 DIAGNOSIS — E1165 Type 2 diabetes mellitus with hyperglycemia: Secondary | ICD-10-CM

## 2018-08-29 NOTE — Telephone Encounter (Signed)
Primary Care COVID Screening for L2aboratory Studies:    "Hello, my name is xxx, and I am calling from your clinic at [facility].  I am calling because our records indicate you have diabetes that is not well controlled, meaning that you have high blood sugars, and your primary care provider Gavin Pound, MD is concerned about your health. Diabetes that is not well controlled can lead to multiple health problems, as well as putting you at higher risk for complications if you get COVID.  We would like to help you manage your diabetes. Would that be OK with you?"    Screening:     "First, to help keep everyone safe, Im going to ask you a few screening questions. We ask all of our patients these questions before they come into clinic. Just because you answer yes does not mean that you have COVID, but we ask these questions to help make sure that we know how you are doing and can do our best to help you."     1. In the past 2 weeks, have you had any of the following:  Fever? No  Cough? No  Shortness of breath? No  Sore throat? No  Runny nose? No  Muscle aches? No  Diarrhea? No  Rash? No  Inability to smell or taste (food, etc.)? No  2.  Have you ever tested positive for COVID? No   ? If yes,   Has it been < 14 days since your first day of symptoms?  No OR    Has it been > 14 days but you still have a fever, or worsening cough or shortness of breath? No    If yes to any of the above, advise the patient that you would like to transfer them to the [place] to review these symptoms with a nurse: "Given your symptoms, we recommend waiting to do any blood work or other tests for your diabetes. I do think it is important that you speak with a nurse in our [place] to talk about the symptoms you are having. Is it OK with you if I transfer you to the Triage Center now?"    ? If yes, transfer to [place] [phone])   ? If no, proceed to Counseling and wrap up    Labs:    "We are  offering patients with diabetes an appointment at the [facility] lab to get a diabetes blood test called a hemoglobin A1C. This test tells [place] what your average blood sugar has been over the past 3 months. We have been working hard to make sure that we are able to provide people the care they need in a safe way, with appropriate personal protective equipment like gloves, masks, and gowns, based on recommendations from the state of [place]."    3.  Would you like me to schedule a visit for you at [facility]? YES  ? If yes but prefers different lab site, say: "Right now we are only performing labs like this one at [facility] to make sure we do them as safely as possible. Would you like to schedule at [facility] or would you prefer to wait?"   ? If yes, schedule appointment and place future lab orders   ? If no, proceed to Televisit     Televisit:    "We also recommend that you have a full appointment by phone, called a televisit, with your PCP or a member of his or  her team, to talk about managing your diabetes. Televisits are a good way to be able to speak with your provider without having to come into the clinic."    3.  Can I schedule a televisit for you today?  Yes -      Counseling and wrap up:     A) If lab scheduled:    Counseled that patients should, with appropriate exceptions (e.g. parents) come to their appointments by themselves and family members should wait in the car Yes   All patients must wear masks from home or they will be provided a mask on arrival to clinic Yes    B) If televisit scheduled:     Reminded patient of televisit date and time, and explained again that a televisit is a full appointment with a provider Yes   Encouraged patient to monitor and record blood sugars at least twice a day (upon awakening, before bed, before meals) on a piece of paper to discuss at the visit Yes   Encourage patient to have all of their medications near them for the televisit, so the provider  can review Yes

## 2018-09-01 ENCOUNTER — Other Ambulatory Visit: Payer: Self-pay

## 2018-09-04 ENCOUNTER — Ambulatory Visit
Admission: RE | Admit: 2018-09-04 | Discharge: 2018-09-04 | Disposition: A | Discharge status: Home / Self Care | Payer: No Typology Code available for payment source | Attending: Internal Medicine | Admitting: Internal Medicine

## 2018-09-04 ENCOUNTER — Other Ambulatory Visit: Payer: Self-pay

## 2018-09-04 DIAGNOSIS — R899 Unspecified abnormal finding in specimens from other organs, systems and tissues: Secondary | ICD-10-CM | POA: Diagnosis present

## 2018-09-04 DIAGNOSIS — E1165 Type 2 diabetes mellitus with hyperglycemia: Secondary | ICD-10-CM | POA: Insufficient documentation

## 2018-09-04 LAB — PTH INTACT WITH CALCIUM
PTH INTACT CALCIUM: 10 mg/dL (ref 8.5–10.1)
PTH INTACT: 49.7 pg/mL (ref 18.5–88.0)

## 2018-09-08 LAB — HEMOGLOBIN A1C
ESTIMATED AVERAGE GLUCOSE: 217 — ABNORMAL HIGH (ref 74–160)
HEMOGLOBIN A1C: 9.2 % (ref 4.0–5.6)

## 2018-09-09 ENCOUNTER — Telehealth (HOSPITAL_BASED_OUTPATIENT_CLINIC_OR_DEPARTMENT_OTHER): Payer: Self-pay | Admitting: Internal Medicine

## 2018-09-11 ENCOUNTER — Telehealth (HOSPITAL_BASED_OUTPATIENT_CLINIC_OR_DEPARTMENT_OTHER): Payer: Self-pay | Admitting: Pharmacist

## 2018-09-11 ENCOUNTER — Ambulatory Visit (HOSPITAL_BASED_OUTPATIENT_CLINIC_OR_DEPARTMENT_OTHER): Payer: No Typology Code available for payment source | Admitting: Pharmacist

## 2018-09-11 NOTE — Progress Notes (Signed)
Reason: Diabetes    Translator needed: Yes    Left a voicemail message for the patient regarding missed Pharmacotherapy DM televisit and the need to reschedule. Left the phone number to [facility] along with the clinic hours. To note, this is the 1st outreach attempt. Will continue to outreach.

## 2018-09-15 ENCOUNTER — Telehealth (HOSPITAL_BASED_OUTPATIENT_CLINIC_OR_DEPARTMENT_OTHER): Payer: Self-pay | Admitting: Internal Medicine

## 2018-09-16 ENCOUNTER — Telehealth (HOSPITAL_BASED_OUTPATIENT_CLINIC_OR_DEPARTMENT_OTHER): Payer: Self-pay | Admitting: Internal Medicine

## 2018-09-16 NOTE — Progress Notes (Signed)
2nd Outreach to pt, LVM in [place] to call [facility] to reschedule an appt with Dr. Donovan Kail and with Elberta Fortis.

## 2018-09-17 ENCOUNTER — Telehealth (HOSPITAL_BASED_OUTPATIENT_CLINIC_OR_DEPARTMENT_OTHER): Payer: Self-pay | Admitting: Internal Medicine

## 2018-10-20 ENCOUNTER — Ambulatory Visit (HOSPITAL_BASED_OUTPATIENT_CLINIC_OR_DEPARTMENT_OTHER): Payer: No Typology Code available for payment source | Admitting: Ophthalmology

## 2019-01-15 ENCOUNTER — Other Ambulatory Visit (HOSPITAL_BASED_OUTPATIENT_CLINIC_OR_DEPARTMENT_OTHER): Payer: Self-pay | Admitting: Internal Medicine

## 2019-01-15 MED ORDER — CHOLECALCIFEROL 25 MCG (1000 UT) PO TABS
2000.0000 [IU] | ORAL_TABLET | Freq: Every day | ORAL | 5 refills | Status: DC
Start: 2019-01-15 — End: 2019-08-13

## 2019-01-15 NOTE — Telephone Encounter (Signed)
PER Pharmacy, Amanda Cervantes is a 53 year old female has requested a refill of vit d       Last Office Visit: 04/21/2018 with Gavin Pound   Last Physical Exam: 04/21/2018    DM EYE EXAM due on 06/23/2018  A1C due on 12/05/2018    ADHD Med: Most Recent BP Reading(s)  05/22/18 : 120/80   Most Recent Weight Reading(s)  04/21/18 : 90.7 kg (200 lb)      Documented patient preferred pharmacies:    [place], [place] - [address]  Phone: [phone] Fax: [phone]

## 2019-02-11 ENCOUNTER — Ambulatory Visit (HOSPITAL_BASED_OUTPATIENT_CLINIC_OR_DEPARTMENT_OTHER): Payer: No Typology Code available for payment source | Admitting: Pharmacist

## 2019-02-25 ENCOUNTER — Ambulatory Visit (HOSPITAL_BASED_OUTPATIENT_CLINIC_OR_DEPARTMENT_OTHER): Payer: No Typology Code available for payment source | Admitting: Pharmacist

## 2019-02-25 ENCOUNTER — Telehealth (HOSPITAL_BASED_OUTPATIENT_CLINIC_OR_DEPARTMENT_OTHER): Payer: Self-pay | Admitting: Pharmacist

## 2019-02-25 NOTE — Telephone Encounter (Signed)
Encounter reason for Pharmacotherapy tracking: Outreach     Translator needed: Yes: [phone]    Left a voicemail message for the patient regarding missed Pharmacotherapy DM televisit and the need to reschedule. Will continue to outreach.

## 2019-04-02 ENCOUNTER — Other Ambulatory Visit (HOSPITAL_BASED_OUTPATIENT_CLINIC_OR_DEPARTMENT_OTHER): Payer: Self-pay | Admitting: Internal Medicine

## 2019-04-02 DIAGNOSIS — I10 Essential (primary) hypertension: Secondary | ICD-10-CM

## 2019-04-02 NOTE — Telephone Encounter (Signed)
PER Pharmacy, Amanda Cervantes is a 54 year old female has requested a refill of LISINOPRIL,, CHLORTHALIDONE.      Last Office Visit: 04/21/18 with PCP  Last Physical Exam: 04/21/18    DM EYE EXAM due on 06/23/2018  A1C due on 12/05/2018    Other Med Adult:  Most Recent BP Reading(s)  05/22/18 : 120/80        Cholesterol (mg/dL)   Date Value   04/21/2018 144     LOW DENSITY LIPOPROTEIN DIRECT (mg/dL)   Date Value   04/21/2018 90     HIGH DENSITY LIPOPROTEIN (mg/dL)   Date Value   04/21/2018 37 (L)     TRIGLYCERIDES (mg/dL)   Date Value   04/21/2018 164 (H)         THYROID SCREEN TSH REFLEX FT4 (uIU/mL)   Date Value   05/04/2014 1.530         No results found for: TSH    HEMOGLOBIN A1C (%)   Date Value   09/04/2018 9.2 (*H)       POC HEMOGLOBIN A1C (%)   Date Value   04/21/2018 10.8 (*H)         No results found for: INR    SODIUM (mmol/L)   Date Value   05/22/2018 140       POTASSIUM (mmol/L)   Date Value   05/22/2018 3.6           CREATININE (mg/dL)   Date Value   05/22/2018 0.8       Documented patient preferred pharmacies:    [facility], [address] [place]  Phone: [phone] Fax: [phone]

## 2019-04-08 ENCOUNTER — Telehealth (HOSPITAL_BASED_OUTPATIENT_CLINIC_OR_DEPARTMENT_OTHER): Payer: Self-pay | Admitting: Pharmacist

## 2019-04-08 NOTE — Telephone Encounter (Signed)
Encounter reason for Pharmacotherapy tracking: Outreach     Left a voicemail message for the patient regarding missed Pharmacotherapy DM televisit and the need to reschedule. Left the phone number to [facility] along with the clinic hours.

## 2019-05-11 ENCOUNTER — Other Ambulatory Visit (HOSPITAL_BASED_OUTPATIENT_CLINIC_OR_DEPARTMENT_OTHER): Payer: Self-pay | Admitting: Internal Medicine

## 2019-05-11 NOTE — Telephone Encounter (Signed)
PER Patient (self), Amanda Cervantes is a 54 year old female has requested a refill of metFORMIN (GLUCOPHAGE-XR) 750 MG 24 hr tablet.  atorvastatin (LIPITOR) 10 MG tablet    Last Office Visit: 04/21/2018 with Rosalie Doctor  Last Physical Exam: 04/21/2018      Other Med Adult:  Most Recent BP Reading(s)  05/22/18 : 120/80        Cholesterol (mg/dL)   Date Value   04/21/2018 144     LOW DENSITY LIPOPROTEIN DIRECT (mg/dL)   Date Value   04/21/2018 90     HIGH DENSITY LIPOPROTEIN (mg/dL)   Date Value   04/21/2018 37 (L)     TRIGLYCERIDES (mg/dL)   Date Value   04/21/2018 164 (H)         THYROID SCREEN TSH REFLEX FT4 (uIU/mL)   Date Value   05/04/2014 1.530         No results found for: TSH    HEMOGLOBIN A1C (%)   Date Value   09/04/2018 9.2 (*H)       POC HEMOGLOBIN A1C (%)   Date Value   04/21/2018 10.8 (*H)         No results found for: INR    SODIUM (mmol/L)   Date Value   05/22/2018 140       POTASSIUM (mmol/L)   Date Value   05/22/2018 3.6           CREATININE (mg/dL)   Date Value   05/22/2018 0.8       Documented patient preferred pharmacies:    [facility], [address]  Phone: [phone] Fax: [phone]

## 2019-06-23 ENCOUNTER — Encounter (HOSPITAL_BASED_OUTPATIENT_CLINIC_OR_DEPARTMENT_OTHER): Payer: Self-pay

## 2019-08-13 ENCOUNTER — Other Ambulatory Visit (HOSPITAL_BASED_OUTPATIENT_CLINIC_OR_DEPARTMENT_OTHER): Payer: Self-pay | Admitting: Internal Medicine

## 2019-08-13 NOTE — Telephone Encounter (Signed)
PER Patient (self), Amanda Cervantes is a 54 year old female has requested a refill of vitamin d.      Last Office Visit: 04/21/2018 with Cohen,P  Last Physical Exam: 04/21/2018    DM EYE EXAM Completed  A1C due on 12/05/2018  PAP SMEAR due on 09/14/2019  HPV SCREENING due on 09/14/2019    Other Med Adult:  Most Recent BP Reading(s)  05/22/18 : 120/80        Cholesterol (mg/dL)   Date Value   04/21/2018 144     LOW DENSITY LIPOPROTEIN DIRECT (mg/dL)   Date Value   04/21/2018 90     HIGH DENSITY LIPOPROTEIN (mg/dL)   Date Value   04/21/2018 37 (L)     TRIGLYCERIDES (mg/dL)   Date Value   04/21/2018 164 (H)         THYROID SCREEN TSH REFLEX FT4 (uIU/mL)   Date Value   05/04/2014 1.530         No results found for: TSH    HEMOGLOBIN A1C (%)   Date Value   09/04/2018 9.2 (*H)       POC HEMOGLOBIN A1C (%)   Date Value   04/21/2018 10.8 (*H)         No results found for: INR    SODIUM (mmol/L)   Date Value   05/22/2018 140       POTASSIUM (mmol/L)   Date Value   05/22/2018 3.6           CREATININE (mg/dL)   Date Value   05/22/2018 0.8       Documented patient preferred pharmacies:    [facility], [address]. [address]  Phone: [phone] Fax: [phone]

## 2019-10-05 ENCOUNTER — Other Ambulatory Visit: Payer: Self-pay

## 2019-10-05 ENCOUNTER — Encounter (HOSPITAL_BASED_OUTPATIENT_CLINIC_OR_DEPARTMENT_OTHER): Payer: Self-pay | Admitting: Internal Medicine

## 2019-10-05 ENCOUNTER — Ambulatory Visit: Payer: No Typology Code available for payment source | Attending: Internal Medicine | Admitting: Internal Medicine

## 2019-10-05 VITALS — BP 126/82 | HR 104 | Temp 98.1°F | Wt 203.0 lb

## 2019-10-05 DIAGNOSIS — G8929 Other chronic pain: Secondary | ICD-10-CM | POA: Diagnosis present

## 2019-10-05 DIAGNOSIS — M25512 Pain in left shoulder: Secondary | ICD-10-CM | POA: Insufficient documentation

## 2019-10-05 DIAGNOSIS — E1165 Type 2 diabetes mellitus with hyperglycemia: Secondary | ICD-10-CM | POA: Diagnosis present

## 2019-10-05 DIAGNOSIS — E559 Vitamin D deficiency, unspecified: Secondary | ICD-10-CM | POA: Insufficient documentation

## 2019-10-05 DIAGNOSIS — L989 Disorder of the skin and subcutaneous tissue, unspecified: Secondary | ICD-10-CM | POA: Diagnosis present

## 2019-10-05 LAB — MICROALBUMIN RANDOM URINE
ALB/CREAT RATIO URINE RAN: 19 ug/mg (ref 0–30)
ALBUMIN URINE RANDOM: 1.9 mg/dL (ref 0.0–1.9)
CREATININE RANDOM URINE: 98 mg/dl

## 2019-10-05 NOTE — Progress Notes (Signed)
CC: Diabetes follow-up & several issues    left leg skin lesion  She notes for many years has a bump on her left leg, but now it's more sensitive to touch    left shoulder pain  For a year or so she has discomfort when trying to pick her left arm above her head  Hurts with repetitive motion, no swelling or trauma  No paresthesias or weakness of extremities    FOLLOW-UP DIABETES  See EPIC Problem List for HPI.    Her last HbA1c was     HEMOGLOBIN A1C (%)   Date Value   09/04/2018 9.2 (*H)   03/25/2017 7.9 (H)   02/25/2017 7.9 (H)         HEMOGLOBIN A1C (%)   Date Value   09/04/2018 9.2 (*H)   03/25/2017 7.9 (H)   02/25/2017 7.9 (H)       POC HEMOGLOBIN A1C (%)   Date Value   04/21/2018 10.8 (*H)          our primary goal is HgA1c is <8%          Her Most Recent Weight Reading(s)  04/21/18 : 90.7 kg (200 lb)  04/17/18 : 91.6 kg (202 lb)  04/25/17 : 91.6 kg (202 lb)  04/22/17 : 91.2 kg (201 lb)  03/25/17 : 94.3 kg (208 lb)    She does not report polyuria, polydipsia, blurry vision, chest pain, dyspnea or claudication.  No foot burning, numbness or pain.       She does not report feeling lightheaded or tremulous.    Please see EPIC RHM field for last visit to ophtho & podiatry.    Her last microalbumin was:   ALB/CREAT RATIO URINE RAN (ug/mg)   Date Value   04/21/2018 9     last potassium was   POTASSIUM (mmol/L)   Date Value   05/22/2018 3.6       last creatinine was   CREATININE (mg/dL)   Date Value   05/22/2018 0.8     last   ESTIMATED GLOMERULAR FILT RATE (ML/MIN)   Date Value   05/22/2018 > 60       She reports taking medicines as below    Miray, Manc[place] D   Home Medication Instructions [id number]    Printed on:10/05/19 1412   Medication Information                      atorvastatin (LIPITOR) 10 MG tablet  TAKE 1 TABLET BY MOUTH DAILY             chlorthalidone (HYGROTEN) 25 MG tablet  TAKE 1 TABLET BY MOUTH DAILY             D3-1000 25 MCG (1000 UT) tablet  TAKE 2 TABLETS BY MOUTH DAILY              lisinopril (ZESTRIL) 40 MG tablet  TAKE 1 TABLET BY MOUTH DAILY             metFORMIN (GLUCOPHAGE-XR) 750 MG 24 hr tablet  TAKE 2 TABLETS BY MOUTH NIGHTLY                 She is prescribed metformin and the dose is not greater than the maximum dose recommended given her renal function as follows:   GFR >60, 2500 mg;   GFR 45-59, 2000 mg;   GFR 30-44, 1000 mg; reassess need for metformin on yearly basis  GFR <30,  avoid   (based on recommendations in [place] 2014;312:2668).    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  She came to the [place] in 2005       In [place], she worked as a Musician.       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      She lives with boyfriend & daughter; no DV;        she has 2 children, age 72 y/o son in [place] & 16 y/o daughter lives with her    See vitals in Epic  Heart: S1 and S2 normal, no murmurs, clicks, gallops or rubs. Regular rate and rhythm.   Lungs:  clear; no wheezes, rhonchi or rales.   Skin: left posterior thigh with 58mm erythematous, slightly elevated lesion  Shoulder exam - left normal; full range of motion, no pain on motion, no tenderness or deformity noted.    MMSE: well groomed, affect normal, no tangential thoughts, no pressured speech, no agitation or psychomotor slowing, normal response-time to questions    ASSESSMENT & PLAN:  (E11.65) Uncontrolled type 2 diabetes  mellitus with hyperglycemia (HCC)  (primary encounter diagnosis)  Comment: she has not followed up for >1 year, she is in need of tighter glycemic control - her metformin dose is close to max, so additional agents will be likely necessary - but first we'll await the results of updated test results,then in the near future meet with pharmacist to start on next agent  Plan: BASIC METABOLIC PANEL, HEMOGLOBIN A1C, LOW         DENSITY LIPOPROTEIN DIRECT, MICROALBUMIN RANDOM        URINE, REFERRAL TO PHARMACOTHERAPY SERVICES         (INT), REFERRAL TO OPHTHALMOLOGY ( INT),         REFERRAL TO PODIATRY ( INT), PTH INTACT WITH         CALCIUM, HEPATITIS C ANTIBODY            (E55.9) Vitamin D deficiency  Comment: will repeat  Plan: VITAMIN D,25 HYDROXY            (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     The patient was ready to learn and no apparent learning or adherence barriers were identified. I explained the diagnosis and treatment plan, and the patient expressed understanding of the content. I attempted to answer any questions regarding the diagnosis and the proposed treatment.    Possible side effects of the prescribed medication was explained. We discussed the patients current medications.  We discussed the importance of medication compliance. The patient expressed understanding and no barriers to adherence were identified.    follow-up will be scheduled for 1 month from now  she has been advised to call or return with any worsening or new problems

## 2019-10-06 LAB — HEMOGLOBIN A1C
ESTIMATED AVERAGE GLUCOSE: 309 — ABNORMAL HIGH (ref 74–160)
HEMOGLOBIN A1C: 12.4 % (ref 4.0–5.6)

## 2019-10-12 ENCOUNTER — Encounter (HOSPITAL_BASED_OUTPATIENT_CLINIC_OR_DEPARTMENT_OTHER): Payer: Self-pay | Admitting: Primary Podiatric Medicine

## 2019-10-15 ENCOUNTER — Telehealth (HOSPITAL_BASED_OUTPATIENT_CLINIC_OR_DEPARTMENT_OTHER): Payer: Self-pay | Admitting: Internal Medicine

## 2019-10-15 ENCOUNTER — Encounter (HOSPITAL_BASED_OUTPATIENT_CLINIC_OR_DEPARTMENT_OTHER): Payer: Self-pay | Admitting: Internal Medicine

## 2019-10-15 LAB — BASIC METABOLIC PANEL
ANION GAP: 7 mmol/L (ref 5–15)
BUN (UREA NITROGEN): 19 mg/dL — ABNORMAL HIGH (ref 7–18)
CALCIUM: 10.6 mg/dL — ABNORMAL HIGH (ref 8.5–10.1)
CARBON DIOXIDE: 25 mmol/L (ref 21–32)
CHLORIDE: 105 mmol/L (ref 98–107)
CREATININE: 1 mg/dL (ref 0.4–1.2)
ESTIMATED GLOMERULAR FILT RATE: >60 mL/min (ref >60)
Glucose Random: 327 mg/dL — ABNORMAL HIGH (ref 74–160)
POTASSIUM: 3.4 mmol/L — ABNORMAL LOW (ref 3.5–5.1)
SODIUM: 137 mmol/L (ref 136–145)

## 2019-10-15 LAB — LOW DENSITY LIPOPROTEIN DIRECT: LOW DENSITY LIPOPROTEIN DIRECT: 91 mg/dL (ref 0–189)

## 2019-10-15 LAB — PTH INTACT WITH CALCIUM
PTH INTACT CALCIUM: 10.6 mg/dL — ABNORMAL HIGH (ref 8.5–10.1)
PTH INTACT: 38.2 pg/mL (ref 18.5–88.0)

## 2019-10-15 LAB — HEPATITIS C ANTIBODY: HEPATITIS C ANTIBODY: NONREACTIVE

## 2019-10-15 LAB — VITAMIN D,25 HYDROXY: VITAMIN D,25 HYDROXY: 30 ng/mL (ref 30.0–100.0)

## 2019-10-15 NOTE — Telephone Encounter (Signed)
Hi RN's-     Please call pt to let her know that I received her lab tests.    1) DM very high, very important to keep upcoming apt with Elberta Fortis    2) level of calcium is a bit high, we need to do more testing to figure out why, the next serious of tests need to be drawn at [facility] [place] lab - can't be drawn elsewhere, please have her arrange for that blood test, future studies ordered    Thanks, [employer]

## 2019-10-15 NOTE — Telephone Encounter (Signed)
1st outreach. LVM to call and speak with RN

## 2019-10-22 ENCOUNTER — Ambulatory Visit: Payer: No Typology Code available for payment source | Attending: Internal Medicine | Admitting: Pharmacist

## 2019-10-22 DIAGNOSIS — E1165 Type 2 diabetes mellitus with hyperglycemia: Secondary | ICD-10-CM | POA: Diagnosis present

## 2019-10-22 MED ORDER — TRULICITY 0.75 MG/0.5ML SC SOPN
0.7500 mg | PEN_INJECTOR | SUBCUTANEOUS | 5 refills | Status: DC
Start: 2019-10-22 — End: 2020-03-31

## 2019-10-22 NOTE — Progress Notes (Signed)
Diabetes Management Follow Up Televisit       Subjective:    Translator used for this visit: Yes, [place].    Amanda Cervantes is a 54 year old female with type 2 diabetes who is followed by pharmacotherapy for diabetes management.   Medication reconciliation was completed and the patient reports (+) adherence to all medications including metformin XR 750mg  - 2 tabs daily at HS (unable to tolerate higher doses d/t gi upset).    S/sx of hypoglycemia: denies    S/sx of hyperglycemia: increased urination    Other symptoms/complaints: none      SMBG: has meter, but not testing.    Diet: patient reports eating more carbs and over-eating d/t the pandemic.      Exercise: active cleaning homes, otherwise no formal exercise.         Objective:    Most Recent BP Reading(s)  10/05/19 : 126/82  05/22/18 : 120/80  04/21/18 : 118/76        Cholesterol   Date Value   04/21/2018 144 mg/dL   03/25/2017 186 mg/dL   10/29/2011 162 mg/dl     LOW DENSITY LIPOPROTEIN DIRECT (mg/dL)   Date Value   10/05/2019 91   04/21/2018 90   03/25/2017 123     HIGH DENSITY LIPOPROTEIN   Date Value   04/21/2018 37 mg/dL (L)   03/25/2017 41 mg/dL   10/29/2011 31 mg/dl (L)     TRIGLYCERIDES   Date Value   04/21/2018 164 mg/dL (H)   03/25/2017 156 mg/dL (H)   08/04/2009 99 mg/dl         HEMOGLOBIN A1C (%)   Date Value   10/05/2019 12.4 (*H)   09/04/2018 9.2 (*H)   03/25/2017 7.9 (H)       POC HEMOGLOBIN A1C (%)   Date Value   04/21/2018 10.8 (*H)           Assessment/Recommendations:  Diabetes  a. Last A1c was above goal (< 8% per referral) at 12.4 %. Increased from last value.  Next due: 01/22/20  b. Last urine albumin/SCr ratio: negative for albuminuria.  ACE-I/ARB? Yes. lisinopril.  Next due: July, 2022   c. Medications:   i. Start Trulicity 0.75mg  weekly  1. Discussed how to use  a. Encouraged pt to review with pharmacy at pick-up and/or to contact writer with any questions  2. Discussed most common adverse effects  ii. Continue metformin XR 1500mg   daily  d. SMBG: encouraged patient to check daily (am fasting and/or 2 hrs after food).    e. Diet: Encourage patient to increase intake of lean protein and veggies and decrease intake of carbs/sweets.  f. Activity: Encouraged patient to increase activity level to at least 52min/day via walking and light exercises   g. Education topics reviewed:   i. A1c/SMBG goals  i. Complications of uncontrolled DM  ii. Importance of medication and visit compliance  iii. Diet/exercise as a means of controlling BG  iv. Advised patient to call clinic prior to next appt for med adjustments should they experience readings <70 or >300.         Blood Pressure  a. Blood pressure goal <140/90 per JNC 8 guidelines.   b. Interventions:   i. Continue lisinopril 40mg  daily  ii. Continue lisinopril 40 mg daily          Lipids  a. Per most recent ADA guidelines: If > 48 years old without ASCVD - use moderate intensity statin or consider  high if ASCVD risk factors present   b. Last lipid panel revealed LDL 91 mg/dL. Next due: July, 2022  c. Interventions: continue atorvastatin 10 mg daily along with lifestyle changes.           Planned follow-up date: 9/2 at 2pm       Patient verbalized understanding of everything discussed and agrees with plan. Patient was given the opportunity to ask questions/voice concerns.    Majority of this visit was spent counseling the patient regarding medication management and chronic disease education.      Pharmacotherapy tracking for: Diabetes, Hyperlipidemia and Hypertension/Elevated BP

## 2019-10-22 NOTE — Telephone Encounter (Signed)
Call to patient, advised as below  States understanding and agrees with plan  To front desk to schedule for blood work at [facility]

## 2019-10-22 NOTE — Telephone Encounter (Signed)
LVM for the pt to call us back to schedule a Lab only appointment.    Pt states she wants to go [place], 10/22/2019

## 2019-10-29 ENCOUNTER — Telehealth (HOSPITAL_BASED_OUTPATIENT_CLINIC_OR_DEPARTMENT_OTHER): Payer: Self-pay | Admitting: Pharmacist

## 2019-10-29 ENCOUNTER — Other Ambulatory Visit (HOSPITAL_BASED_OUTPATIENT_CLINIC_OR_DEPARTMENT_OTHER): Payer: Self-pay

## 2019-10-30 ENCOUNTER — Ambulatory Visit (HOSPITAL_BASED_OUTPATIENT_CLINIC_OR_DEPARTMENT_OTHER): Payer: No Typology Code available for payment source | Admitting: Internal Medicine

## 2019-11-03 ENCOUNTER — Ambulatory Visit (HOSPITAL_BASED_OUTPATIENT_CLINIC_OR_DEPARTMENT_OTHER): Payer: Self-pay | Admitting: Internal Medicine

## 2019-11-17 ENCOUNTER — Encounter (HOSPITAL_BASED_OUTPATIENT_CLINIC_OR_DEPARTMENT_OTHER): Payer: Self-pay

## 2019-11-17 NOTE — Progress Notes (Signed)
[facility]. Care Coordination Program. Pt outreached to reschedule PAP. Left message to return a call.

## 2019-11-19 ENCOUNTER — Ambulatory Visit: Payer: No Typology Code available for payment source | Attending: Internal Medicine | Admitting: Pharmacist

## 2019-11-19 DIAGNOSIS — E1165 Type 2 diabetes mellitus with hyperglycemia: Secondary | ICD-10-CM | POA: Insufficient documentation

## 2019-11-19 NOTE — Progress Notes (Signed)
Diabetes Management Follow Up Televisit       Subjective:    Translator used for this visit: Yes, [place].    Amanda Cervantes is a 54 year old female with type 2 diabetes who is followed by pharmacotherapy for diabetes management.   Today's visit was scheduled to follow-up on starting Trulicity.   Patient reports (+) adherence to all medications including Trulicity 0.75mg  weekly and metformin XR 1500mg  daily.    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         Objective:    HEMOGLOBIN A1C (%)   Date Value   10/05/2019 12.4 (*H)   09/04/2018 9.2 (*H)   03/25/2017 7.9 (H)       POC HEMOGLOBIN A1C (%)   Date Value   04/21/2018 10.8 (*H)           Assessment/Recommendations:  Diabetes  a. Last A1c was above goal (< 8% per referral, but reasonable to aim for 7%) at 12.4 %. Increased from last value.  Next due: 01/05/20  b. Last urine albumin/SCr ratio: negative for albuminuria.  ACE-I/ARB? Yes. lisinopril.  Next due: July, 2022   c. Medications:   i. Continue metformin XR 1500mg  daily  ii. Continue Trulicity 0.75mg  weekly  d. SMBG: continue checking daily (am fasting and/or 2 hrs after food).    e. Diet: Encourage patient to increase intake of lean protein and veggies and decrease intake of carbs/sweets.  f. Activity: Encouraged patient to increase activity level to at least 44min/day via walking and light exercises   g. Education topics reviewed:   i. What is diabetes?  ii. S/sx of hypoglycemia and how to treat  iii. S/sx of hyperglycemia  i. A1c/SMBG goals  iv. Complications of uncontrolled DM  v. Importance of medication and visit compliance  vi. High vs low carbohydrate food  vii. Diet/exercise as a means of  controlling BG  viii. Advised patient to call clinic prior to next appt for med adjustments should they experience readings <70 or >300.   ix. Importance of limiting alcohol   x. Advised smoker that quitting smoking will reduce his risk of diabetic heart disease, stroke, and amputations            Planned follow-up date: 11/8 at 2pm       Patient verbalized understanding of everything discussed and agrees with plan. Patient was given the opportunity to ask questions/voice concerns.    Majority of this visit was spent counseling the patient regarding medication management and chronic disease education.      Pharmacotherapy tracking for: Diabetes

## 2019-11-30 ENCOUNTER — Ambulatory Visit
Admission: RE | Admit: 2019-11-30 | Discharge: 2019-11-30 | Disposition: A | Discharge status: Home / Self Care | Payer: No Typology Code available for payment source | Attending: Internal Medicine | Admitting: Internal Medicine

## 2019-11-30 ENCOUNTER — Other Ambulatory Visit: Payer: Self-pay

## 2019-12-01 ENCOUNTER — Encounter (HOSPITAL_BASED_OUTPATIENT_CLINIC_OR_DEPARTMENT_OTHER): Payer: Self-pay

## 2019-12-02 NOTE — Progress Notes (Signed)
[facility]. Care Coordination Program.  Pt outreached for PAP/HPV.  Pt agreed to schedule it on 10/25 at 11 am at [facility].

## 2019-12-07 ENCOUNTER — Other Ambulatory Visit (HOSPITAL_BASED_OUTPATIENT_CLINIC_OR_DEPARTMENT_OTHER): Payer: Self-pay | Admitting: Internal Medicine

## 2019-12-07 LAB — PTH RELATED PEPTIDE: PTH-RELATED PEPTIDE: <2 pmol/L

## 2019-12-07 NOTE — Progress Notes (Signed)
Katio, please let her know her blood test from last week was normal - there are still additional tests we need to do to try to figure out why her calcium is high - please have her schedule a next blood test.   Thanks, Bennye Alm

## 2019-12-09 ENCOUNTER — Telehealth (HOSPITAL_BASED_OUTPATIENT_CLINIC_OR_DEPARTMENT_OTHER): Payer: Self-pay

## 2019-12-09 NOTE — Telephone Encounter (Signed)
Pt. Reached. Pt. Wants to do test at [facility]. Number to lab given to call and schedule.

## 2019-12-09 NOTE — Telephone Encounter (Signed)
Amanda Cervantes, please let her know her blood test from last week was normal - there are still additional tests we need to do to try to figure out why her calcium is high - please have her schedule a next blood test.   Thanks, Bennye Alm

## 2019-12-10 ENCOUNTER — Ambulatory Visit
Admission: RE | Admit: 2019-12-10 | Discharge: 2019-12-10 | Disposition: A | Discharge status: Home / Self Care | Payer: No Typology Code available for payment source | Attending: Internal Medicine | Admitting: Internal Medicine

## 2019-12-10 ENCOUNTER — Other Ambulatory Visit: Payer: Self-pay

## 2019-12-10 LAB — BASIC METABOLIC PANEL
ANION GAP: 10 mmol/L (ref 5–15)
BUN (UREA NITROGEN): 20 mg/dL — ABNORMAL HIGH (ref 7–18)
CALCIUM: 9.9 mg/dL (ref 8.5–10.1)
CARBON DIOXIDE: 27 mmol/L (ref 21–32)
CHLORIDE: 106 mmol/L (ref 98–107)
CREATININE: 0.7 mg/dL (ref 0.4–1.2)
ESTIMATED GLOMERULAR FILT RATE: >60 mL/min (ref >60)
Glucose Random: 164 mg/dL — ABNORMAL HIGH (ref 74–160)
POTASSIUM: 4 mmol/L (ref 3.5–5.1)
SODIUM: 143 mmol/L (ref 136–145)

## 2019-12-10 LAB — THYROID SCREEN TSH REFLEX FT4: THYROID SCREEN TSH REFLEX FT4: 1.803 u[IU]/mL (ref 0.358–3.740)

## 2019-12-10 LAB — VITAMIN D,25 HYDROXY: VITAMIN D,25 HYDROXY: 47 ng/mL (ref 30.0–100.0)

## 2019-12-14 ENCOUNTER — Encounter (HOSPITAL_BASED_OUTPATIENT_CLINIC_OR_DEPARTMENT_OTHER): Payer: Self-pay | Admitting: Internal Medicine

## 2019-12-14 LAB — VITAMIN D 1,25-DIHYDROXY: VITAMIN D 1,25-DIHYDROXY: 43.9 pg/mL (ref 19.9–79.3)

## 2020-01-11 ENCOUNTER — Encounter (HOSPITAL_BASED_OUTPATIENT_CLINIC_OR_DEPARTMENT_OTHER): Payer: Self-pay | Admitting: Internal Medicine

## 2020-01-14 ENCOUNTER — Other Ambulatory Visit (HOSPITAL_BASED_OUTPATIENT_CLINIC_OR_DEPARTMENT_OTHER): Payer: Self-pay | Admitting: Internal Medicine

## 2020-01-14 ENCOUNTER — Other Ambulatory Visit: Payer: Self-pay

## 2020-01-14 ENCOUNTER — Ambulatory Visit: Payer: No Typology Code available for payment source | Attending: Ophthalmology | Admitting: Ophthalmology

## 2020-01-14 DIAGNOSIS — H52203 Unspecified astigmatism, bilateral: Secondary | ICD-10-CM | POA: Insufficient documentation

## 2020-01-14 DIAGNOSIS — E119 Type 2 diabetes mellitus without complications: Secondary | ICD-10-CM | POA: Diagnosis present

## 2020-01-14 DIAGNOSIS — H524 Presbyopia: Secondary | ICD-10-CM

## 2020-01-14 DIAGNOSIS — E1165 Type 2 diabetes mellitus with hyperglycemia: Secondary | ICD-10-CM | POA: Diagnosis present

## 2020-01-14 DIAGNOSIS — H5203 Hypermetropia, bilateral: Secondary | ICD-10-CM | POA: Diagnosis not present

## 2020-01-14 NOTE — Telephone Encounter (Signed)
PER Pharmacy, Amanda Cervantes is a 54 year old female has requested a refill of      -  VIT D3       Last Office Visit: 10/05/2019 with PCP  Last Physical Exam: 04/21/2018     DM EYE EXAM Never done  PAP SMEAR due on 09/14/2019  HPV SCREENING due on 09/14/2019  A1C due on 01/05/2020     Other Med Adult:  Most Recent BP Reading(s)  10/05/19 : 126/82        Cholesterol (mg/dL)   Date Value   04/21/2018 144     LOW DENSITY LIPOPROTEIN DIRECT (mg/dL)   Date Value   10/05/2019 91     HIGH DENSITY LIPOPROTEIN (mg/dL)   Date Value   04/21/2018 37 (L)     TRIGLYCERIDES (mg/dL)   Date Value   04/21/2018 164 (H)         THYROID SCREEN TSH REFLEX FT4 (uIU/mL)   Date Value   12/10/2019 1.803         No results found for: TSH    HEMOGLOBIN A1C (%)   Date Value   10/05/2019 12.4 (*H)       POC HEMOGLOBIN A1C (%)   Date Value   04/21/2018 10.8 (*H)         No results found for: INR    SODIUM (mmol/L)   Date Value   12/10/2019 143       POTASSIUM (mmol/L)   Date Value   12/10/2019 4.0           CREATININE (mg/dL)   Date Value   12/10/2019 0.7        Documented patient preferred pharmacies:    [facility], [place] - [place]. [address]  Phone: [phone] Fax: [phone]

## 2020-01-14 NOTE — Progress Notes (Signed)
For diabetic eye exam to evaluate for retinopathy    BS poorly controlled.    HEMOGLOBIN A1C (%)   Date Value   10/05/2019 12.4 (*H)   09/04/2018 9.2 (*H)   03/25/2017 7.9 (H)       POC HEMOGLOBIN A1C (%)   Date Value   04/21/2018 10.8 (*H)     Impression,    1. Poorly controlled AODM (Metformin, Trulicity) No gross retinopathy    Macula OCT-no thickening/DME)    Fundus photos-No heme or exudates    Plan-stressed importance of getting better control of BS with aim of HBA1c <7.0    RTC 1 year/prn    2.  Hyperopia, astigmatism, and presbyopia-    she was refracted and given a prescription to get glasses that she can fill at any optical shop.    She was encouraged to try +2.25 or +2.50 OTC readers.

## 2020-01-14 NOTE — Progress Notes (Signed)
Pt here for DM exam.    BS at home 99-120.     Last A1C high      DM no retinopathy  Astigmatism/presbyopia      Mac OCT/photos done today    HEMOGLOBIN A1C (%)   Date Value   10/05/2019 12.4 (*H)   09/04/2018 9.2 (*H)   03/25/2017 7.9 (H)       POC HEMOGLOBIN A1C (%)   Date Value   04/21/2018 10.8 (*H)

## 2020-01-21 ENCOUNTER — Encounter (HOSPITAL_BASED_OUTPATIENT_CLINIC_OR_DEPARTMENT_OTHER): Payer: Self-pay

## 2020-01-21 NOTE — Progress Notes (Signed)
[facility]. Care Coordination Program.  Pt outreached for PAP.  Pt agreed to schedule it on 12/06 at 4:40 pm at [facility].

## 2020-01-25 ENCOUNTER — Ambulatory Visit (HOSPITAL_BASED_OUTPATIENT_CLINIC_OR_DEPARTMENT_OTHER): Payer: Self-pay | Admitting: Pharmacist

## 2020-01-28 ENCOUNTER — Telehealth (HOSPITAL_BASED_OUTPATIENT_CLINIC_OR_DEPARTMENT_OTHER): Payer: Self-pay | Admitting: Pharmacist

## 2020-01-28 NOTE — Telephone Encounter (Signed)
Outreach    Date of missed visit: 01/25/20      Attempt: 1st    For in person visit     Outcome:   Left message to schedule via clinic    Should pt return my call:  Please schedule with Amanda Cervantes , rx consult 35

## 2020-02-22 ENCOUNTER — Ambulatory Visit: Payer: No Typology Code available for payment source | Attending: Internal Medicine | Admitting: Internal Medicine

## 2020-02-22 ENCOUNTER — Encounter (HOSPITAL_BASED_OUTPATIENT_CLINIC_OR_DEPARTMENT_OTHER): Payer: Self-pay | Admitting: Internal Medicine

## 2020-02-22 ENCOUNTER — Other Ambulatory Visit: Payer: Self-pay

## 2020-02-22 VITALS — BP 110/70 | HR 91 | Temp 97.8°F | Ht 62.5 in | Wt 192.0 lb

## 2020-02-22 DIAGNOSIS — Z7189 Other specified counseling: Secondary | ICD-10-CM | POA: Insufficient documentation

## 2020-02-22 DIAGNOSIS — Z124 Encounter for screening for malignant neoplasm of cervix: Secondary | ICD-10-CM | POA: Diagnosis present

## 2020-02-22 DIAGNOSIS — Z01419 Encounter for gynecological examination (general) (routine) without abnormal findings: Secondary | ICD-10-CM | POA: Insufficient documentation

## 2020-02-22 NOTE — Progress Notes (Signed)
54 year old woman here for pelvic part of annual exam.     OB History   G2  P2  T2  P0  A0  L0    SAB0  TAB0  Ectopic0  Molar0  Multiple0  Live Births0      CC/HPI: here for pap only, no particular concerns      Current Outpatient Medications   Medication Sig    D3-1000 25 MCG (1000 UT) tablet TAKE 2 TABLETS BY MOUTH DAILY    dulaglutide (TRULICITY) 2.53 GU/4.4IH injection Inject 0.75 mg under the skin once a week    atorvastatin (LIPITOR) 10 MG tablet TAKE 1 TABLET BY MOUTH DAILY    metFORMIN (GLUCOPHAGE-XR) 750 MG 24 hr tablet TAKE 2 TABLETS BY MOUTH NIGHTLY    lisinopril (ZESTRIL) 40 MG tablet TAKE 1 TABLET BY MOUTH DAILY    chlorthalidone (HYGROTEN) 25 MG tablet TAKE 1 TABLET BY MOUTH DAILY     No current facility-administered medications for this visit.       Penicillins    Patient Active Problem List:     Obesity (BMI 30-39.9)     Essential hypertension, benign     Tobacco dependence     Diabetes type 2, uncontrolled (HCC)     Left knee pain     Left leg pain     Headache disorder     Diverticulitis of large intestine without perforation or abscess without bleeding     History of colonic polyps     Plantar plate injury, left, initial encounter     Metatarsal deformity, left     Rash     Vitamin D deficiency     Hyperlipidemia associated with type 2 diabetes mellitus (HCC)     Astigmatism of both eyes with presbyopia     h/o Hypercalcemia      Past Surgical History:  No date: LAPAROSCOPY FULGURATION OVIDUCTS  No date: OB ANTEPARTUM CARE CESAREAN DLVR & POSTPARTUM  No date: TUBAL LIGATION    Social History     Socioeconomic History    Marital status: Legally Separated     Spouse name: Not on file    Number of children: Not on file    Years of education: Not on file    Highest education level: Not on file   Occupational History    Not on file   Tobacco Use    Smoking status: Former Smoker     Packs/day: 0.33     Years: 10.00     Pack years: 3.30     Types: Cigarettes     Quit date:  10/15/2012     Years since quitting: 7.3    Smokeless tobacco: Never Used    Tobacco comment: quit 3 months ago on 10/15/12   Substance and Sexual Activity    Alcohol use: Yes     Comment: minimal    Drug use: No    Sexual activity: Yes     Partners: Male     Birth control/protection: Tubal Ligation   Other Topics Concern    Not on file   Social History Narrative    she is originally from [place] ,[place].  She came to the [place] in 2005     In [place], she worked as a Musician.     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    She lives with boyfriend & daughter; no DV;      she has  2 children, age 2 y/o son in [place] & 48 y/o daughter lives with her   Social Determinants of Librarian, academic Strain:     Difficulty of Paying Living Expenses: Not on file  Food Insecurity:     Worried About Charity fundraiser in the Last Year: Not on file    [employer] of Food in the Last Year: Not on file  Transportation Needs:     Film/video editor (Medical): Not on file    Lack of Transportation (Non-Medical): Not on file  Physical Activity:     Days of Exercise per Week: Not on file    Minutes of Exercise per Session: Not on file  Stress:     Feeling of Stress : Not on file  Social Connections:     Frequency of Communication with Friends and Family: Not on file    Frequency of Social Gatherings with Friends and Family: Not on file    Attends Religious Services: Not on file    Active Member of Clubs or Organizations: Not on file    Attends Archivist Meetings: Not on file    Marital Status: Not on file  Intimate Partner Violence:     Fear of Current or Ex-Partner: Not on file    Emotionally Abused: Not on file    Physically Abused: Not on file    Sexually Abused: Not on file              Review of patient's family history indicates:  Problem: Heart Disease      Relation: Father          Age of Onset: (Not Specified)           Comment: died age 67 from MI; learned about HTN in 31's  Problem: Cancer - Other      Relation: Mother          Age of Onset: (Not Specified)          Comment: stomach cancer, died  Problem: GI      Relation: Sister          Age of Onset: (Not Specified)          Comment: hernia  Problem: Thyroid      Relation: Sister          Age of Onset: (Not Specified)      ROS:  negative    PHYSICAL EXAM:  BP 110/70    Pulse 91    Temp 97.8 F (36.6 C) (Temperature probe)    Ht 5' 2.5" (1.588 m)    Wt 87.1 kg (192 lb)    SpO2 100%    BMI 34.56 kg/m         PELVIC:  External Genitalia: normal architecture  Urethral Meatus: normal size and location, without lesions or prolapse  Urethra: without masses, tenderness or scarring  Bladder: without fullness, masses or tenderness  Vagina: well rugated and no lesions  Vaginal Discharge: normal appearing, white and clumpy  Pelvic supports: normal  Cervix: no lesions  Uterus: anteverted, normal size and non-tender      ASSESSMENT & PLAN:  (Z12.4) Screening for cervical cancer  (primary encounter diagnosis)  Comment:   Plan: CYTOPATH, C/V, THIN LAYER, OBTAINING SCREEN PAP        SMEAR, HUMAN PAPILLOMAVIRUS (HPV)            (Z01.419) Encounter for gynecological examination without abnormal finding  Comment:   Plan:

## 2020-02-22 NOTE — Patient Instructions (Signed)
Call [phone]: ask to schedule COVID vaccine.

## 2020-02-23 LAB — HUMAN PAPILLOMAVIRUS (HPV): HUMAN PAPILLOMAVIRUS: NEGATIVE

## 2020-02-27 LAB — CYTOPATH, C/V, THIN LAYER

## 2020-02-29 ENCOUNTER — Telehealth (HOSPITAL_BASED_OUTPATIENT_CLINIC_OR_DEPARTMENT_OTHER): Payer: Self-pay | Admitting: Internal Medicine

## 2020-02-29 NOTE — Telephone Encounter (Signed)
Pap and HPV are normal, but pathology results show yeast and signs of BV.    To Rns to call and ask about symptoms, if having any itching or pelvic discomfort will treat both, if not having any symptoms no need to treat, sometimes found incidentally.

## 2020-02-29 NOTE — Telephone Encounter (Signed)
Amanda Cervantes Stann Mainland, [place] Rn Triage Pool 26 minutes ago (9:40 AM)     RR    Hi, can someone call her re: pap that shows yeast and BV? See note. Thanks!    Routing comment

## 2020-02-29 NOTE — Telephone Encounter (Signed)
Call to patient and advised as below.states understanding and agrees with plan. Denies symptoms as per provider below at this time.  Advised no need to treat at this time, call us back prn if any issues. She agrees.

## 2020-03-08 ENCOUNTER — Encounter (HOSPITAL_BASED_OUTPATIENT_CLINIC_OR_DEPARTMENT_OTHER): Payer: Self-pay

## 2020-03-08 NOTE — Progress Notes (Signed)
[facility]. Care Coordination Program. Pt outreached for Mammogram. Pt agreed to schedule it on 05/05/2020 at 03:20 pm at [facility].

## 2020-03-30 ENCOUNTER — Other Ambulatory Visit (HOSPITAL_BASED_OUTPATIENT_CLINIC_OR_DEPARTMENT_OTHER): Payer: Self-pay | Admitting: Internal Medicine

## 2020-03-30 DIAGNOSIS — I10 Essential (primary) hypertension: Secondary | ICD-10-CM

## 2020-03-31 NOTE — Telephone Encounter (Signed)
PER Pharmacy, Amanda Cervantes is a 55 year old female has requested a refill of chlorthalideone, lisinopril and trulicity.      Last Office Visit: [id number] with [name]  Last Physical Exam: [id number]      Other Med Adult:  Most Recent BP Reading(s)  02/22/20 : 110/70        Cholesterol (mg/dL)   Date Value   04/21/2018 144     LOW DENSITY LIPOPROTEIN DIRECT (mg/dL)   Date Value   10/05/2019 91     HIGH DENSITY LIPOPROTEIN (mg/dL)   Date Value   04/21/2018 37 (L)     TRIGLYCERIDES (mg/dL)   Date Value   04/21/2018 164 (H)         THYROID SCREEN TSH REFLEX FT4 (uIU/mL)   Date Value   12/10/2019 1.803         No results found for: TSH    HEMOGLOBIN A1C (%)   Date Value   10/05/2019 12.4 (*H)       POC HEMOGLOBIN A1C (%)   Date Value   04/21/2018 10.8 (*H)         No results found for: INR    SODIUM (mmol/L)   Date Value   12/10/2019 143       POTASSIUM (mmol/L)   Date Value   12/10/2019 4.0           CREATININE (mg/dL)   Date Value   12/10/2019 0.7       Documented patient preferred pharmacies:    [facility], [place] - [address] [place]. [address]  Phone: [phone] Fax: [phone]

## 2020-04-28 ENCOUNTER — Other Ambulatory Visit (HOSPITAL_BASED_OUTPATIENT_CLINIC_OR_DEPARTMENT_OTHER): Payer: Self-pay | Admitting: Internal Medicine

## 2020-04-28 MED ORDER — METFORMIN HCL ER 750 MG PO TB24
1500.0000 mg | ORAL_TABLET | Freq: Every evening | ORAL | 10 refills | Status: DC
Start: 2020-04-28 — End: 2021-03-25

## 2020-04-28 NOTE — Telephone Encounter (Signed)
PER Patient (self), Amanda Cervantes is a 55 year old female has requested a refill of      -  Metformin        Last Office Visit: 02/22/20 with [name]  Last Physical Exam: 02/22/20     A1C due on 01/05/2020     Other Med Adult:  Most Recent BP Reading(s)  02/22/20 : 110/70        Cholesterol (mg/dL)   Date Value   04/21/2018 144     LOW DENSITY LIPOPROTEIN DIRECT (mg/dL)   Date Value   10/05/2019 91     HIGH DENSITY LIPOPROTEIN (mg/dL)   Date Value   04/21/2018 37 (L)     TRIGLYCERIDES (mg/dL)   Date Value   04/21/2018 164 (H)         THYROID SCREEN TSH REFLEX FT4 (uIU/mL)   Date Value   12/10/2019 1.803         No results found for: TSH    HEMOGLOBIN A1C (%)   Date Value   10/05/2019 12.4 (*H)       POC HEMOGLOBIN A1C (%)   Date Value   04/21/2018 10.8 (*H)         No results found for: INR    SODIUM (mmol/L)   Date Value   12/10/2019 143       POTASSIUM (mmol/L)   Date Value   12/10/2019 4.0           CREATININE (mg/dL)   Date Value   12/10/2019 0.7        Documented patient preferred pharmacies:    [facility], [place] - [address]  Phone: [phone] Fax: [phone]

## 2020-05-05 ENCOUNTER — Other Ambulatory Visit: Payer: Self-pay

## 2020-05-05 ENCOUNTER — Encounter (HOSPITAL_BASED_OUTPATIENT_CLINIC_OR_DEPARTMENT_OTHER): Payer: Self-pay

## 2020-05-05 ENCOUNTER — Ambulatory Visit
Admission: RE | Admit: 2020-05-05 | Discharge: 2020-05-05 | Disposition: A | Discharge status: Home / Self Care | Payer: No Typology Code available for payment source | Attending: Internal Medicine | Admitting: Internal Medicine

## 2020-05-05 DIAGNOSIS — Z1239 Encounter for other screening for malignant neoplasm of breast: Secondary | ICD-10-CM

## 2020-05-05 DIAGNOSIS — Z1231 Encounter for screening mammogram for malignant neoplasm of breast: Secondary | ICD-10-CM | POA: Diagnosis not present

## 2020-05-26 ENCOUNTER — Other Ambulatory Visit (HOSPITAL_BASED_OUTPATIENT_CLINIC_OR_DEPARTMENT_OTHER): Payer: Self-pay | Admitting: Internal Medicine

## 2020-05-26 NOTE — Telephone Encounter (Signed)
PER Patient (self), Amanda Cervantes is a 55 year old female has requested a refill of atorvastatin        Last Office Visit: 02/22/2020 with Rogers,R  Last Physical Exam: 02/22/2020    A1C due on 01/05/2020    Other Med Adult:  Most Recent BP Reading(s)  02/22/20 : 110/70        Cholesterol (mg/dL)   Date Value   04/21/2018 144     LOW DENSITY LIPOPROTEIN DIRECT (mg/dL)   Date Value   10/05/2019 91     HIGH DENSITY LIPOPROTEIN (mg/dL)   Date Value   04/21/2018 37 (L)     TRIGLYCERIDES (mg/dL)   Date Value   04/21/2018 164 (H)         THYROID SCREEN TSH REFLEX FT4 (uIU/mL)   Date Value   12/10/2019 1.803         No results found for: TSH    HEMOGLOBIN A1C (%)   Date Value   10/05/2019 12.4 (*H)       POC HEMOGLOBIN A1C (%)   Date Value   04/21/2018 10.8 (*H)         No results found for: INR    SODIUM (mmol/L)   Date Value   12/10/2019 143       POTASSIUM (mmol/L)   Date Value   12/10/2019 4.0           CREATININE (mg/dL)   Date Value   12/10/2019 0.7       Documented patient preferred pharmacies:    [facility], [place] - [place]. [address]  Phone: [phone] Fax: [phone]

## 2020-05-26 NOTE — Telephone Encounter (Signed)
PER Pharmacy, Amanda Cervantes is a 55 year old female has requested a refill of      -  Vitamin D3 1000u       Last Office Visit: 02/22/20 with [name]  Last Physical Exam: NA     A1C due on 01/05/2020     Other Med Adult:  Most Recent BP Reading(s)  02/22/20 : 110/70        Cholesterol (mg/dL)   Date Value   04/21/2018 144     LOW DENSITY LIPOPROTEIN DIRECT (mg/dL)   Date Value   10/05/2019 91     HIGH DENSITY LIPOPROTEIN (mg/dL)   Date Value   04/21/2018 37 (L)     TRIGLYCERIDES (mg/dL)   Date Value   04/21/2018 164 (H)         THYROID SCREEN TSH REFLEX FT4 (uIU/mL)   Date Value   12/10/2019 1.803         No results found for: TSH    HEMOGLOBIN A1C (%)   Date Value   10/05/2019 12.4 (*H)       POC HEMOGLOBIN A1C (%)   Date Value   04/21/2018 10.8 (*H)         No results found for: INR    SODIUM (mmol/L)   Date Value   12/10/2019 143       POTASSIUM (mmol/L)   Date Value   12/10/2019 4.0           CREATININE (mg/dL)   Date Value   12/10/2019 0.7        Documented patient preferred pharmacies:    [facility], [place] - [place]. [address]  Phone: [phone] Fax: [phone]

## 2020-05-27 ENCOUNTER — Other Ambulatory Visit (HOSPITAL_BASED_OUTPATIENT_CLINIC_OR_DEPARTMENT_OTHER): Payer: Self-pay | Admitting: Pharmacist

## 2020-05-27 DIAGNOSIS — E1165 Type 2 diabetes mellitus with hyperglycemia: Secondary | ICD-10-CM

## 2020-05-30 ENCOUNTER — Other Ambulatory Visit: Payer: Self-pay

## 2020-05-30 ENCOUNTER — Ambulatory Visit: Payer: No Typology Code available for payment source | Attending: Internal Medicine | Admitting: Pharmacist

## 2020-05-30 VITALS — BP 116/76 | HR 64

## 2020-05-30 DIAGNOSIS — E785 Hyperlipidemia, unspecified: Secondary | ICD-10-CM | POA: Diagnosis present

## 2020-05-30 DIAGNOSIS — E1165 Type 2 diabetes mellitus with hyperglycemia: Secondary | ICD-10-CM | POA: Insufficient documentation

## 2020-05-30 DIAGNOSIS — Z23 Encounter for immunization: Secondary | ICD-10-CM | POA: Insufficient documentation

## 2020-05-30 DIAGNOSIS — E1169 Type 2 diabetes mellitus with other specified complication: Secondary | ICD-10-CM | POA: Diagnosis present

## 2020-05-30 NOTE — Progress Notes (Signed)
Diabetes Management Follow Up Visit      Translator used for this visit: Yes, [place].    Amanda Cervantes is a 55 year old female with Type 2, uncontrolled DM who presents to clinic for follow up diabetes management.       Medication reconciliation was completed at the beginning of the visit verbally.  Patient reports (+) adherence to all medications including:    Diabetes Regimen:   Metformin XR 750mg  - 2 tabs daily (pt unable to tolerate higher doses)   Trulicity [id number] mg weekly    Hypertension Regimen:   Lisinopril 40 mg daily   chlorthalidone 25mg  daily    Lipid Regimen:   atorvastatin 10 mg daily      S/sx of hypoglycemia: denies    S/sx of hyperglycemia: denies    Other symptoms/complaints: occasionally has numbness in 2 toes on her left foot        SMBG: Tests am fasting and PP. Brought glucometer/log? No.   Patient reports a fasting range of 70-118 with the majority of readings between 90-118 and PP range of 120-160.       BP Self-monitoring: tests occasionally, pt reports readings are always around 112/80      Diet: patient denies change in diet, continues to increase lean protein and vegetable intake and limiting carbs.       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      Most Recent BP Reading(s)  05/30/20:  116/76  02/22/20 : 110/70  10/05/19 : 126/82  05/22/18 : 120/80        Cholesterol   Date Value   04/21/2018 144 mg/dL   03/25/2017 186 mg/dL   10/29/2011 162 mg/dl     LOW DENSITY LIPOPROTEIN DIRECT (mg/dL)   Date Value   10/05/2019 91   04/21/2018 90   03/25/2017 123     HIGH DENSITY LIPOPROTEIN   Date Value   04/21/2018 37 mg/dL (L)   03/25/2017 41 mg/dL   10/29/2011 31 mg/dl (L)     TRIGLYCERIDES   Date Value   04/21/2018 164 mg/dL (H)   03/25/2017 156 mg/dL (H)   08/04/2009 99 mg/dl         HEMOGLOBIN A1C (%)   Date Value   10/05/2019 12.4 (*H)   09/04/2018 9.2 (*H)   03/25/2017 7.9 (H)       POC HEMOGLOBIN A1C (%)   Date Value   04/21/2018 10.8 (*H)            Assessment/Recommendations:  1. Diabetes  a. Last A1c was above goal (< 8% per referral) at 12.4 %.  Next due: today - will get via veni and contact patient on 3/17 to discuss  b. Last urine albumin/SCr ratio: negative for albuminuria.  ACE-I/ARB? Yes. lisinopril.  Next due: 10/04/20   c. Medications:   i. Continue metformin XR 750mg  twice daily  ii. Continue Trulicity 0.75mg  weekly  d. SMBG: Continue to check daily (am fasting and/or 2 hrs after food)   i. Advised patient to call clinic prior to next appt for med adjustments should they experience readings <70 or >250      2. Blood Pressure  a. Blood pressure was at goal (<140/90 per JNC 8 guidelines).   b. Interventions:   i. Continue lisinopril 40mg  daily  ii. Continue chlorthalidone 25mg  daily  c. Instructed patient to check BP weekly and contact clinic with abnormal readings      3. Lipids  a. Last  lipid panel revealed LDL 91 mg/dL. Next due: 10/04/20  b. 10-year ASCVD risk score (Goff DC Jr.,et al.,2013) is: 3.96 %.         c. Interventions: continue atorvastatin 10mg  daily        Education:   Importance of medication and visit compliance   What is diabetes?   S/sx of hypoglycemia and how to treat   S/sx of hyperglycemia   A1c/SMBG goals   Blood pressure goals   Complications of uncontrolled DM, hypertension, and hyperlipidemia   High vs low carbohydrate food   Diet/exercise as a means of controlling BG  o Encourage patient to increase intake of lean protein and veggies and decrease intake of carbs/sweets  o Encouraged patient to get at least 77min of exercise a day via walking and/or light exercises   o Importance of limiting alcohol           Will discuss lab results on 3/17 or sooner if clinically indicated.           Instructed patient to call clinic or go to the ER if they are experiencing unusual symptoms.    Patient verbalized understanding of everything discussed and agrees with plan. Patient was given the opportunity to ask  questions/voice concerns.    Majority of this visit was spent counseling the patient regarding medication management and chronic disease education.    Pharmacotherapy tracking for: Diabetes, Hyperlipidemia and Hypertension/Elevated BP                     Influenza Vaccine Procedure  May 30, 2020    1. Has the patient received the information for the influenza vaccine? Yes    2. Does the patient have any of the following contraindications?  Allergy to eggs? No  Allergic reaction to previous influenza vaccines? No  Any other problems to previous influenza vaccines? No  Paralyzed by Guillain-Barre syndrome?  No  Current moderate or severe illness? No  Allergy to contact lens solution? No    3. The vaccine has been administered in the usual fashion.     Immunization information reviewed. Current VIS reviewed and given to patient/ guardian. Verbal assent obtained from patient/ guardian.  See immunization/Injection module or chart review for date of publication and additional information. Verbal assent obtained from patient/guardian. Comfort measures for possible side effects reviewed.

## 2020-05-31 LAB — HEMOGLOBIN A1C
ESTIMATED AVERAGE GLUCOSE: 192 mg/dL — ABNORMAL HIGH (ref 74–160)
HEMOGLOBIN A1C: 8.3 % (ref 4.0–5.6)

## 2020-06-02 ENCOUNTER — Ambulatory Visit: Payer: No Typology Code available for payment source | Attending: Internal Medicine | Admitting: Pharmacist

## 2020-06-02 DIAGNOSIS — E1165 Type 2 diabetes mellitus with hyperglycemia: Secondary | ICD-10-CM | POA: Diagnosis present

## 2020-06-02 MED ORDER — TRULICITY 1.5 MG/0.5ML SC SOPN
1.5000 mg | PEN_INJECTOR | SUBCUTANEOUS | 11 refills | Status: DC
Start: 2020-06-02 — End: 2021-05-26

## 2020-06-02 NOTE — Progress Notes (Signed)
Diabetes Management Follow Up Televisit       Translator used for this visit: Yes, [place].    Amanda Cervantes is a 55 year old female with type 2 diabetes who is followed by pharmacotherapy for diabetes management.     The patient was last seen on 05/30/20 and today's visit was scheduled to follow-up on her A1c result.    Medication reconciliation was completed and the patient reports (+) adherence to all medications including:    DM Regimen:   Metformin XR 750mg  - 2 tabs daily (pt unable to tolerate higher doses)   Trulicity 0.75mg  weekly        HEMOGLOBIN A1C (%)   Date Value   05/30/2020 8.3 (*H)   10/05/2019 12.4 (*H)         Assessment/Recommendations:  Diabetes  a. Last A1c was above goal (< 8% per referral, however reasonable to aim for 7%) at 8.3 %.  Next due: 08/30/20  b. Last urine albumin/SCr ratio: negative for albuminuria.  ACE-I/ARB? Yes. lisinopril.  Next due: July, 2022   c. Medications:   i. Increase Trulicity to 1.5mg  weekly  ii. Continue metformin XR 750mg  twice daily  d. SMBG: Continue checking daily (am fasting and/or 2 hrs after food)    i. Advised patient to call clinic prior to next appt for med adjustments should they experience readings <70 or >250          Education:    Importance of medication and visit compliance   S/sx of hypoglycemia and how to treat   A1c/SMBG goals        Planned follow-up date: 4/21 at 3:15pm       Patient verbalized understanding of everything discussed and agrees with plan. Patient was given the opportunity to ask questions/voice concerns.    Majority of this visit was spent counseling the patient regarding medication management and chronic disease education.      Pharmacotherapy tracking for: Diabetes

## 2020-07-07 ENCOUNTER — Ambulatory Visit (HOSPITAL_BASED_OUTPATIENT_CLINIC_OR_DEPARTMENT_OTHER): Payer: No Typology Code available for payment source | Admitting: Pharmacist

## 2020-07-07 ENCOUNTER — Telehealth (HOSPITAL_BASED_OUTPATIENT_CLINIC_OR_DEPARTMENT_OTHER): Payer: Self-pay | Admitting: Pharmacist

## 2020-07-07 NOTE — Telephone Encounter (Signed)
Outreach    Date of missed visit: 07/07/20    Attempt: 1st    For televisit    Outcome:   Left message to schedule via clinic    Should pt return my call:  Please schedule with Eliane Decree , televisit 30 min

## 2020-08-16 ENCOUNTER — Ambulatory Visit: Payer: No Typology Code available for payment source | Attending: Internal Medicine | Admitting: Pharmacist

## 2020-08-16 DIAGNOSIS — E1165 Type 2 diabetes mellitus with hyperglycemia: Secondary | ICD-10-CM | POA: Insufficient documentation

## 2020-08-16 NOTE — Progress Notes (Signed)
Diabetes Management Follow Up Televisit       Translator used for this visit: Yes, [place].    Amanda Cervantes is a 55 year old female with type 2 diabetes who is followed by pharmacotherapy for diabetes management.     The patient was last seen on 06/02/20 and today's visit was scheduled to follow-up on increasing Trulicity.    Patient reports (+) adherence to all medications including:    DM Regimen:   Metformin XR 750mg  - 2 tabs daily (pt unable to tolerate higher doses)   Trulicity 1.5mg  weekly      S/sx of hypoglycemia: denies    S/sx of hyperglycemia: denies    Other symptoms/complaints: nausea x1 day about 2 weeks        SMBG: Usually tests once or twice daily, however the past few months she has been testing roughly every other day about 2 hours after dinner.    Patient reports a range of 88-110 with the majority between 100-110.       Diet: patient denies change in diet, however does report eating less.        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        HEMOGLOBIN A1C (%)   Date Value   05/30/2020 8.3 (*H)   10/05/2019 12.4 (*H)         Assessment/Recommendations:  Diabetes  a. Last A1c was above goal (< 8% per referral, however reasonable to aim for 7%) at 8.3 %.  Next due: 08/30/20  b. Last urine albumin/SCr ratio: negative for albuminuria.  ACE-I/ARB? Yes. lisinopril.  Next due: July, 2022   c. Medications:   i. Continue Trulicity to 1.5mg  weekly  ii. Continue metformin XR 750mg  twice daily  d. SMBG: Continue checking daily (am fasting and/or 2 hrs after food)    i. Advised patient to call clinic prior to next appt for med adjustments should they experience readings <70 or >250        Education:   Importance of medication and visit compliance   S/sx of hypoglycemia and how to treat   S/sx of hyperglycemia   A1c/SMBG goals   Diet/exercise as a means of controlling BG  ? Encourage patient to increase intake of lean protein and veggies and decrease intake of  carbs/sweets  ? Encouraged patient to get at least 94min of exercise a day via walking and/or light exercises         Planned follow-up date: 7/6 at 4 pm       Patient verbalized understanding of everything discussed and agrees with plan. Patient was given the opportunity to ask questions/voice concerns.    Majority of this visit was spent counseling the patient regarding medication management and chronic disease education.      Encounter reason for Pharmacotherapy tracking: Diabetes

## 2020-08-27 ENCOUNTER — Other Ambulatory Visit (HOSPITAL_BASED_OUTPATIENT_CLINIC_OR_DEPARTMENT_OTHER): Payer: Self-pay | Admitting: Internal Medicine

## 2020-08-28 NOTE — Telephone Encounter (Signed)
[facility], Amanda Cervantes is a 55 year old female has requested a refill of vit d.      Last Office Visit: 02/22/20 with rebecca rogers   Last Physical Exam: 02/22/20      Other Med Adult:  Most Recent BP Reading(s)  05/30/20 : 116/76        Cholesterol (mg/dL)   Date Value   04/21/2018 144     LOW DENSITY LIPOPROTEIN DIRECT (mg/dL)   Date Value   10/05/2019 91     HIGH DENSITY LIPOPROTEIN (mg/dL)   Date Value   04/21/2018 37 (L)     TRIGLYCERIDES (mg/dL)   Date Value   04/21/2018 164 (H)         THYROID SCREEN TSH REFLEX FT4 (uIU/mL)   Date Value   12/10/2019 1.803         No results found for: TSH    HEMOGLOBIN A1C (%)   Date Value   05/30/2020 8.3 (*H)       POC HEMOGLOBIN A1C (%)   Date Value   04/21/2018 10.8 (*H)         No results found for: INR    SODIUM (mmol/L)   Date Value   12/10/2019 143       POTASSIUM (mmol/L)   Date Value   12/10/2019 4.0           CREATININE (mg/dL)   Date Value   12/10/2019 0.7       Documented patient preferred pharmacies:    [facility], [place] [address]  Phone: [phone] Fax: [phone]

## 2020-09-21 ENCOUNTER — Institutional Professional Consult (permissible substitution) (HOSPITAL_BASED_OUTPATIENT_CLINIC_OR_DEPARTMENT_OTHER): Payer: Self-pay | Admitting: Pharmacist

## 2020-10-11 ENCOUNTER — Emergency Department
Admission: EM | Admit: 2020-10-11 | Discharge: 2020-10-11 | Disposition: A | Discharge status: Home / Self Care | Payer: No Typology Code available for payment source | Attending: Student in an Organized Health Care Education/Training Program | Admitting: Student in an Organized Health Care Education/Training Program

## 2020-10-11 DIAGNOSIS — U071 COVID-19: Secondary | ICD-10-CM | POA: Diagnosis not present

## 2020-10-11 DIAGNOSIS — R519 Headache, unspecified: Secondary | ICD-10-CM | POA: Insufficient documentation

## 2020-10-11 MED ORDER — NAPROXEN 500 MG PO TABS
500.0000 mg | ORAL_TABLET | Freq: Two times a day (BID) | ORAL | 0 refills | Status: DC | PRN
Start: 2020-10-11 — End: 2020-10-20

## 2020-10-11 MED ORDER — ACETAMINOPHEN 500 MG PO TABS
1000.0000 mg | ORAL_TABLET | Freq: Once | ORAL | Status: AC
Start: 2020-10-11 — End: 2020-10-11
  Administered 2020-10-11: 1000 mg via ORAL
  Filled 2020-10-11: qty 2

## 2020-10-11 MED ORDER — IBUPROFEN 600 MG PO TABS
600.0000 mg | ORAL_TABLET | Freq: Once | ORAL | Status: AC
Start: 2020-10-11 — End: 2020-10-11
  Administered 2020-10-11: 600 mg via ORAL
  Filled 2020-10-11: qty 1

## 2020-10-11 NOTE — ED Triage Note (Signed)
Pt arrives w/ cc of HA that started yesterday. Pt states having pain to the touch on left side of face and denies any trauma to the area. Pt states testing Covid positive last Monday.

## 2020-10-11 NOTE — ED Provider Notes (Signed)
EMERGENCY DEPARTMENT  NOTE        The patient was seen primarily by me. The Emergency Department nursing record was reviewed. Prior records as available through the electronic health record were reviewed.    Chief Complaint: Headache      History of Present Illness:    This 55 year old female patient presents with headache, COVID infection.  Patient states she tested positive for COVID last week on Monday.  Patient presents because she developed yesterday which has continued today, gradual onset left-sided headache, associated with sensitivity to touch to the left side of her scalp and face.  Denies any facial weakness, extremity weakness/numbness, hearing changes/visual changes, neck pain.  Patient did not take any medications today, took some Tylenol yesterday.    Review of Systems: Pertinent positives were reviewed as per the HPI above. All other systems were reviewed and are negative.      Past Medical History/Problem list:  Past Medical History:  06/22/2017: Astigmatism of both eyes with presbyopia  No date: HTN (hypertension)  Patient Active Problem List:     Obesity (BMI 30-39.9)     Essential hypertension, benign     Tobacco dependence     Diabetes type 2, uncontrolled (HCC)     Left knee pain     Left leg pain     Headache disorder     Diverticulitis of large intestine without perforation or abscess without bleeding     History of colonic polyps     Plantar plate injury, left, initial encounter     Metatarsal deformity, left     Rash     Vitamin D deficiency     Hyperlipidemia associated with type 2 diabetes mellitus ([place])     Astigmatism of both eyes with presbyopia     h/o Hypercalcemia        Past Surgical History:   Past Surgical History:  No date: LAPAROSCOPY FULGURATION OVIDUCTS  No date: OB ANTEPARTUM CARE CESAREAN DLVR & POSTPARTUM  No date: TUBAL LIGATION      Medications:   No current facility-administered medications for this encounter.     Current Outpatient Medications   Medication Sig     D3-1000 25 MCG (1000 UT) tablet TAKE 2 TABLETS BY MOUTH DAILY    dulaglutide (TRULICITY) 1.5 0000000 injection Inject 1.5 mg under the skin once a week    atorvastatin (LIPITOR) 10 MG tablet TAKE 1 TABLET BY MOUTH DAILY    metFORMIN (GLUCOPHAGE-XR) 750 MG 24 hr tablet Take 2 tablets by mouth nightly    lisinopril (ZESTRIL) 40 MG tablet TAKE 1 TABLET BY MOUTH DAILY    chlorthalidone (HYGROTEN) 25 MG tablet TAKE 1 TABLET BY MOUTH DAILY       Immunizations:    Social History:   Social History     Socioeconomic History    Marital status: Legally Separated     Spouse name: Not on file    Number of children: Not on file    Years of education: Not on file    Highest education level: Not on file   Occupational History    Not on file   Tobacco Use    Smoking status: Former Smoker     Packs/day: 0.33     Years: 10.00     Pack years: 3.30     Types: Cigarettes     Quit date: 10/15/2012     Years since quitting: 7.9    Smokeless tobacco: Never Used  Tobacco comment: quit 3 months ago on 10/15/12   Substance and Sexual Activity    Alcohol use: Yes     Comment: minimal    Drug use: No    Sexual activity: Yes     Partners: Male     Birth control/protection: Tubal Ligation   Other Topics Concern    Not on file   Social History Narrative    she is originally from [place] ,[place].  She came to the [place] in 2005     In [place], she worked as a Musician.     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    She lives with boyfriend & daughter; no DV;      she has 2 children, age 66 y/o son in [place] & 74 y/o daughter lives with her   Social Determinants of Librarian, academic Strain: Not on file  Food Insecurity: Not on file  Transportation Needs: Not on file  Physical Activity: Not on file  Stress: Not on file  Social Connections: Not on file  Intimate Partner Violence: Not on file  Housing Stability: Not on file      Allergies:  Review of Patient's Allergies indicates:    Penicillins                 Comment:Took skin 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 & was positive, never             actually took it      Physical Exam:  GENERAL:  55 year old female in no apparent distress.    VITAL SIGNS:  BP 128/97    Pulse 98    Temp 98 F (Oral)    Resp 20    SpO2 99%     HEENT:  Head atraumatic.  PERRL.  Extraocular muscles intact.  Neck is supple.  Clear tympanic membranes bilaterally, no focal tenderness to the face, patient reports increased sensitivity to light touch to left upper face and scalp  CHEST:  Lungs are clear to auscultation bilaterally.  No wheeze or crackles.   CARDIAC:  RRR, S1/S2 present, no m/r/g     EXTREMITIES:  Warm and well perfused.   SKIN:  Warm and dry.  No rash.  No facial rash  NEUROLOGIC: Cranial nerves 2-12 intact, no focal motor/sensory deficits.  Alert and appropriate.  No facial tenderness/weakness       ED Course and Medical Decision Making: 55 year old female presents with gradual onset left-sided headache in the setting of COVID infection, headache is left-sided and associated with sensitivity to touch, reassuringly patient does not have any skin rash to suggest herpes zoster, facial cellulitis.  No signs of sinusitis.  Patient does not have any signs of facial nerve palsy, however pain could be an early sign of facial nerve involvement related to COVID infection, so I did speak with patient regarding monitoring her symptoms and discussed return precautions.    Patient was given ibuprofen and Tylenol, her headache was gradual onset and not concerning for sudden onset severe headache or intracranial hemorrhage.    Very reassuring evaluation, patient discharged to follow-up with her PCP          ED Visit History:  Prior ED visits          Orders:   Orders Placed This Encounter      acetaminophen (TYLENOL) tablet 1,000 mg      ibuprofen (ADVIL) tablet 600 mg  naproxen (NAPROSYN) 500 MG tablet          Sig: Take 1 tablet by mouth every 12 (twelve) hours as needed for Pain as  needed for pain. Take with food  for up to 10 days          Dispense:  20 tablet          Refill:  0          Results:  No results found for this visit on 10/11/20 (from the past 24 hour(s)).          Critical Care Time: 0 minutes        PROVISIONARY DIAGNOSIS:  Left-sided headache  COVID-19 virus infection              Konrad Saha, MD, 10/11/2020

## 2020-10-11 NOTE — Narrator Note (Signed)
Patient Disposition  Patient education for diagnosis, medications, activity, diet and follow-up.  Patient left ED 12:05 PM.  Patient rep received written instructions.    Interpreter to provide instructions: No    Patient belongings with patient: YES    Have all existing LDAs been addressed? N/A    Have all IV infusions been stopped? N/A    Destination: Discharged to home

## 2020-10-11 NOTE — Discharge Instructions (Addendum)
Your evaluation was reassuring, follow with your PCP regarding ongoing headache.  Continue Tylenol and he can also take naproxen for headache relief.      Return to the emergency department if you develop any weakness to your face, skin rash, or any other severe/worsening symptoms.

## 2020-10-17 ENCOUNTER — Ambulatory Visit: Payer: No Typology Code available for payment source | Attending: Internal Medicine | Admitting: Pharmacist

## 2020-10-17 ENCOUNTER — Other Ambulatory Visit: Payer: Self-pay

## 2020-10-17 ENCOUNTER — Encounter (HOSPITAL_BASED_OUTPATIENT_CLINIC_OR_DEPARTMENT_OTHER): Payer: Self-pay | Admitting: Ophthalmology

## 2020-10-17 ENCOUNTER — Emergency Department
Admission: EM | Admit: 2020-10-17 | Discharge: 2020-10-17 | Disposition: A | Discharge status: Home / Self Care | Payer: No Typology Code available for payment source | Attending: Physician Assistant | Admitting: Physician Assistant

## 2020-10-17 ENCOUNTER — Ambulatory Visit: Payer: No Typology Code available for payment source | Attending: Ophthalmology | Admitting: Ophthalmology

## 2020-10-17 ENCOUNTER — Encounter (HOSPITAL_BASED_OUTPATIENT_CLINIC_OR_DEPARTMENT_OTHER): Payer: Self-pay | Admitting: Internal Medicine

## 2020-10-17 DIAGNOSIS — H40053 Ocular hypertension, bilateral: Secondary | ICD-10-CM | POA: Diagnosis present

## 2020-10-17 DIAGNOSIS — B0230 Zoster ocular disease, unspecified: Secondary | ICD-10-CM | POA: Diagnosis present

## 2020-10-17 DIAGNOSIS — B0239 Other herpes zoster eye disease: Secondary | ICD-10-CM | POA: Insufficient documentation

## 2020-10-17 DIAGNOSIS — H538 Other visual disturbances: Secondary | ICD-10-CM | POA: Insufficient documentation

## 2020-10-17 DIAGNOSIS — B0050 Herpesviral ocular disease, unspecified: Secondary | ICD-10-CM

## 2020-10-17 DIAGNOSIS — E1165 Type 2 diabetes mellitus with hyperglycemia: Secondary | ICD-10-CM | POA: Diagnosis not present

## 2020-10-17 DIAGNOSIS — R21 Rash and other nonspecific skin eruption: Secondary | ICD-10-CM | POA: Diagnosis present

## 2020-10-17 LAB — POC A1C: POC HEMOGLOBIN A1C: 6.5 % — ABNORMAL HIGH (ref 4–5.6)

## 2020-10-17 LAB — SCREENING TEST VISUAL ACUITY QUANTITATIVE BILAT

## 2020-10-17 MED ORDER — IBUPROFEN 600 MG PO TABS
600.0000 mg | ORAL_TABLET | Freq: Three times a day (TID) | ORAL | 0 refills | Status: DC | PRN
Start: 2020-10-17 — End: 2020-10-20

## 2020-10-17 MED ORDER — VALACYCLOVIR HCL 1 G PO TABS
1.00 g | ORAL_TABLET | Freq: Two times a day (BID) | ORAL | 0 refills | Status: AC
Start: 2020-10-17 — End: 2020-10-27

## 2020-10-17 MED ORDER — TETRACAINE HCL 0.5 % OP SOLN
2.0000 [drp] | Freq: Once | OPHTHALMIC | Status: DC
Start: 2020-10-17 — End: 2020-10-17
  Filled 2020-10-17: qty 4

## 2020-10-17 MED ORDER — ERYTHROMYCIN 5 MG/GM OP OINT
TOPICAL_OINTMENT | Freq: Four times a day (QID) | OPHTHALMIC | 0 refills | Status: AC
Start: 2020-10-17 — End: 2020-10-27

## 2020-10-17 MED ORDER — PREDNISONE 20 MG PO TABS
40.00 mg | ORAL_TABLET | Freq: Once | ORAL | Status: AC
Start: 2020-10-17 — End: 2020-10-17
  Administered 2020-10-17: 40 mg via ORAL
  Filled 2020-10-17: qty 2

## 2020-10-17 MED ORDER — VALACYCLOVIR HCL 500 MG PO TABS
1000.0000 mg | ORAL_TABLET | Freq: Two times a day (BID) | ORAL | Status: DC
Start: 2020-10-17 — End: 2020-10-17
  Administered 2020-10-17: 1000 mg via ORAL
  Filled 2020-10-17: qty 2

## 2020-10-17 MED ORDER — KETOROLAC TROMETHAMINE 30 MG/ML INJ
30.0000 mg | Freq: Once | Status: AC
Start: 2020-10-17 — End: 2020-10-17
  Administered 2020-10-17: 30 mg via INTRAMUSCULAR
  Filled 2020-10-17: qty 1

## 2020-10-17 MED ORDER — FLUORESCEIN SODIUM 1 MG OP STRP
1.0000 | ORAL_STRIP | Freq: Once | OPHTHALMIC | Status: DC
Start: 2020-10-17 — End: 2020-10-17
  Filled 2020-10-17: qty 1

## 2020-10-17 MED ORDER — PREDNISONE 20 MG PO TABS
40.00 mg | ORAL_TABLET | Freq: Every day | ORAL | 0 refills | Status: AC
Start: 2020-10-17 — End: 2020-10-21

## 2020-10-17 MED ORDER — OXYCODONE HCL 5 MG PO TABS
5.0000 mg | ORAL_TABLET | Freq: Every evening | ORAL | 0 refills | Status: DC | PRN
Start: 2020-10-17 — End: 2020-10-20

## 2020-10-17 NOTE — Progress Notes (Signed)
Diabetes Management Follow Up     Translator used for this visit: Yes, [place].    WAYNETTE KELMAN is a 55 year old female with type 2 diabetes who is followed by pharmacotherapy for Diabetes management. Last seen 08/16/20 via TV.     Was not able to complete appointment. Patient c/o bumps on left side of face/head where she had throbbing headache and left eye swelling. Previously went to ED visit for pain on 7/26 however the bumps were not present at this time. RN further evaluated to triage patient. D/t eye involvement and degree of pain patient was advised to return to the emergency department.     Note* Was able to obtain POC A1c today. A1c was at goal (<7) at 6.5% improved from last check. Discussed results and commended patient on improvement.     DM follow up via TV with pharmacotherapy rescheduled: 8/30

## 2020-10-17 NOTE — Progress Notes (Signed)
Impression:  Herpes zoster ophthalmicus, left side.  Small corneal dendrite.  No intraocular inflammation.  Diabetes without retinopathy 10/21  Ocular hypertension  Presbyopia    Plan:  Continue Valtrex 1 g bid X 10 days and Prednisone 40 mg po X 5 days per ED.  Begin erythromycin ointment OS qid  See 7-10 days.  Call with worsening.

## 2020-10-17 NOTE — Progress Notes (Signed)
POC A1c discussed with patient during clinic visit. See pharm documentation from 10/17/20.

## 2020-10-17 NOTE — Progress Notes (Signed)
pt here for urgent care visit    Pt was seen in [facility] ED today and diagnosed with HSV with OS involvement    Pt c/o having a painful rash left side of her forehead and OS x 5 days   Pt c/o pain and swelling OS and the entire left side of her head.  Pt has been given an RX for   Prednisolone 40 mg   Valacyclovir 1000 mg BID  Oxycodone  5 mg QHS  Ibuprofen 600 mg Q8H      Pt hasn't started taking anything yet, she will pick up after her visit today      Ocular HX-Type 2 NIDDM  Poorly controlled Type 2 NIDDM  Hyperopia,astigmatism and presbyopia    LEE 01-14-20

## 2020-10-17 NOTE — ED Triage Note (Signed)
Patient complains of rash on right side of forehead causing swelling and redness to right eye since Thursday.  Patient states she was seen last Tuesday for right sided facial pain but was told it was most likely due to previous positive COVID test.

## 2020-10-17 NOTE — ED Provider Notes (Signed)
ED nursing record was reviewed. Prior records as available electronically through the Epic record were reviewed.    Patient is [place] speaking and interview, exam, and final disposition were conducted via an official hospital interpreter.    HPI:    This 55 year old female patient presents to the Emergency Department with chief complaint of patient here complaining of a very painful rash along her left forehead extending into the left eye with eye swelling.  Patient reports that about a week ago she started with headache, pain and burning sensation in this area and then noticed the rash about 2 to 3 days ago.  Patient reports that she is not having fevers or chills but does feel like the eye is excessively tearing, she notes there is redness in the eye and while she does not think she is having vision loss she feels like she is not seeing as well out of the eye.  Patient reports she does have a history of type 2 diabetes, she is on metformin and Trulicity.  She also has a history of hypertension, history of allergies to penicillins.  Reports that she recently had COVID-19 about 2-1/2 weeks ago at this time.  Reports it was a very unimpressive case, she only had runny nose mild congestion.    ROS: Pertinent positives were reviewed as per the HPI above. All other systems were reviewed and are negative.      Past Medical History/Problem list:  Past Medical History:  06/22/2017: Astigmatism of both eyes with presbyopia  No date: HTN (hypertension)  Patient Active Problem List:     Obesity (BMI 30-39.9)     Essential hypertension, benign     Tobacco dependence     Diabetes type 2, uncontrolled (HCC)     Left knee pain     Left leg pain     Headache disorder     Diverticulitis of large intestine without perforation or abscess without bleeding     History of colonic polyps     Plantar plate injury, left, initial encounter     Metatarsal deformity, left     Rash     Vitamin D deficiency     Hyperlipidemia associated with  type 2 diabetes mellitus (HCC)     Astigmatism of both eyes with presbyopia     h/o Hypercalcemia     Zoster ophthalmicus        Past Surgical History: Past Surgical History:  No date: LAPAROSCOPY FULGURATION OVIDUCTS  No date: OB ANTEPARTUM CARE CESAREAN DLVR & POSTPARTUM  No date: TUBAL LIGATION      Medications: No current facility-administered medications on file prior to encounter.  naproxen (NAPROSYN) 500 MG tablet, Take 1 tablet by mouth every 12 (twelve) hours as needed for Pain as needed for pain. Take with food  for up to 10 days, Disp: 20 tablet, Rfl: 0  D3-1000 25 MCG (1000 UT) tablet, TAKE 2 TABLETS BY MOUTH DAILY, Disp: 60 tablet, Rfl: 1  dulaglutide (TRULICITY) 1.5 0000000 injection, Inject 1.5 mg under the skin once a week, Disp: 2 mL, Rfl: 11  atorvastatin (LIPITOR) 10 MG tablet, TAKE 1 TABLET BY MOUTH DAILY, Disp: 30 tablet, Rfl: 10  metFORMIN (GLUCOPHAGE-XR) 750 MG 24 hr tablet, Take 2 tablets by mouth nightly, Disp: 60 tablet, Rfl: 10  lisinopril (ZESTRIL) 40 MG tablet, TAKE 1 TABLET BY MOUTH DAILY, Disp: 30 tablet, Rfl: 11  chlorthalidone (HYGROTEN) 25 MG tablet, TAKE 1 TABLET BY MOUTH DAILY, Disp: 30 tablet, Rfl:  11          Social History:   Social History     Socioeconomic History    Marital status: Legally Separated     Spouse name: Not on file    Number of children: Not on file    Years of education: Not on file    Highest education level: Not on file   Occupational History    Not on file   Tobacco Use    Smoking status: Former Smoker     Packs/day: 0.33     Years: 10.00     Pack years: 3.30     Types: Cigarettes     Quit date: 10/15/2012     Years since quitting: 8.0    Smokeless tobacco: Never Used    Tobacco comment: quit 3 months ago on 10/15/12   Substance and Sexual Activity    Alcohol use: Yes     Comment: minimal    Drug use: No    Sexual activity: Yes     Partners: Male     Birth control/protection: Tubal Ligation   Other Topics Concern    Not on file   Social History  Narrative    she is originally from [place] ,[place].  She came to the [place] in 2005     In [place], she worked as a Musician.     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    She lives with boyfriend & daughter; no DV;      she has 2 children, age 8 y/o son in [place] & 74 y/o daughter lives with her   Social Determinants of Librarian, academic Strain: Not on file  Food Insecurity: Not on file  Transportation Needs: Not on file  Physical Activity: Not on file  Stress: Not on file  Social Connections: Not on file  Intimate Partner Violence: Not on file  Housing Stability: Not on file        Allergies:  Review of Patient's Allergies indicates:   Penicillins                 Comment:Took skin 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 & was positive, never             actually took it      Physical Exam:   10/17/20  1200   BP: (!) 164/105   Pulse: 87   Resp: 18   Temp: 97.8 F   SpO2: 99%       GENERAL: Well appearing, No acute distress, non-toxic.   SKIN:  Warm & Dry, no erythema or rash.  HEAD:  NCAT. Sclerae are anicteric and aninjected.  Patient has a blistering, erythematous rash consistent with acute shingles along the left forehead, she is swelling along the left eyelid and a mild lesion just on the outside of the corner of the eye, she has injection of the eye, PEERLA, no gross deformity noted.  LUNGS:  Clear to auscultation bilaterally. No wheezes, rales, rhonchi.   HEART:  RRR.  No murmurs, rubs, or gallops.   NEUROLOGIC:  Alert and oriented x4      RESULTS  Results for orders placed or performed during the hospital encounter of 10/17/20 (from the past 24 hour(s))   Visual Acuity Screen [facility])    Collection Time: 10/17/20  1:28 PM   Result Value    VISUAL ACUITY LEFT EYE 20/30    VISUAL ACUITY RIGHT EYE  20/20    Stereopsis 20/20       MEDICATIONS ADMINISTERED ON THIS VISIT  Orders Placed This Encounter      ketorolac (TORADOL) injection 30 mg      predniSONE (DELTASONE) tablet 40 mg       valACYclovir HCl (VALTREX) 1 g tablet          Sig: Take 1 tablet by mouth in the morning and 1 tablet before bedtime. Do all this for 10 days.          Dispense:  20 tablet          Refill:  0      predniSONE (DELTASONE) 20 MG tablet          Sig: Take 2 tablets by mouth in the morning for 4 days.          Dispense:  8 tablet          Refill:  0      oxyCODONE (ROXICODONE) 5 MG immediate release tablet          Sig: Take 1 tablet by mouth nightly as needed for Pain (severe pain not relieved by other medications) Max Daily Amount: 5 mg  for up to 5 days          Dispense:  5 tablet          Refill:  0      ibuprofen (ADVIL) 600 MG tablet          Sig: Take 1 tablet by mouth every 8 (eight) hours as needed pain  for up to 10 days          Dispense:  20 tablet          Refill:  0         ED Course and Medical Decision-making:  Spoke to the eye clinic urgently, they will see patient today for full evaluation of her eye especially given her concerns for some mild vision disturbances.  She does have acute herpes, will start her on Valtrex as well as prednisone for the ophthalmic complications.  She was given a small dose of oxycodone as needed for severe pain at night and ibuprofen to take otherwise.  Strict return precautions were otherwise given to the patient which was understanding of and compliant with.    Patient was instructed to follow-up with regular doctor in 2-3 days for re-evaluation.  Signs and symptoms for which to return to the Emergency Department were discussed with the patient in detail, and they understand and are in agreement with her care plan at this time.    The patient remained hemodynamically stable throughout entire visit.    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 Please excuse any typographical errors that have not been edited out.

## 2020-10-17 NOTE — Discharge Instructions (Signed)
Aqui hoje voc tem um diagnstico de herpes zoster, a preocupao  que isso possa estar afetando sua viso. Nossa clnica oftalmolgica vai v-lo assim que voc chegar hoje. Por favor, dirija-se  clnica de [place], o endereo est nas instrues de alta. Medicamentos adicionais foram enviados para sua farmcia preferida. Por favor, tome Valtrex de manh e  noite pelos prximos 10 dias para ajudar com a infeco, assim como a prednisona, 2 comprimidos de manh pelos prximos 4 dias. Alm disso, voc pode tomar oxicodona, que  um medicamento opiide muito forte  noite para ajudar no sono, se voc no estiver com dor intensa, no precisa tom-lo. Continue a tomar ibuprofeno ou naproxeno, o que estiver ajudando, mas no os tome juntos. Se a qualquer momento voc achar que est tendo perda de viso, dor intensa, febre ou calafrios ou vmitos persistentes, retorne ao pronto-socorro imediatamente.        Here today you do have a diagnosis of shingles, the concern is that it may be affecting your vision.  Our eye clinic is going to see you as soon as you arrive today.  Please go to the [facility], the address is in your discharge instructions.  Additional medications were sent to your preferred pharmacy.  Please take the Valtrex in the morning and at night for the next 10 days to help with the infection as well as the prednisone, 2 tablets in the morning for the next 4 days.  Additionally you may take oxycodone which is an opioid medication very strong at night to help with sleep, if you are not having severe pain you do not need to take this.  Continue to take ibuprofen or naproxen whichever is helping, but do not take them together.  If at anytime you find that you are having vision loss, severe pain, fevers or chills or persistent vomiting please return to the emergency department immediately.

## 2020-10-17 NOTE — Narrator Note (Signed)
Patient Disposition  Patient education for diagnosis, medications, activity, diet and follow-up.  Patient left ED 1:41 PM.  Patient rep received written instructions.    Interpreter to provide instructions: Yes; Interpreter ID: [id number]    Patient belongings with patient: YES    Have all existing LDAs been addressed? Yes    Have all IV infusions been stopped? N/A    Destination: Discharged to home

## 2020-10-20 ENCOUNTER — Other Ambulatory Visit: Payer: Self-pay

## 2020-10-20 ENCOUNTER — Ambulatory Visit: Payer: No Typology Code available for payment source | Attending: Internal Medicine | Admitting: Internal Medicine

## 2020-10-20 ENCOUNTER — Other Ambulatory Visit (HOSPITAL_BASED_OUTPATIENT_CLINIC_OR_DEPARTMENT_OTHER): Payer: Self-pay | Admitting: Internal Medicine

## 2020-10-20 VITALS — BP 118/84 | HR 91 | Temp 98.4°F | Wt 184.0 lb

## 2020-10-20 DIAGNOSIS — E1165 Type 2 diabetes mellitus with hyperglycemia: Secondary | ICD-10-CM | POA: Insufficient documentation

## 2020-10-20 DIAGNOSIS — B0230 Zoster ocular disease, unspecified: Secondary | ICD-10-CM | POA: Diagnosis present

## 2020-10-20 LAB — MICROALBUMIN RANDOM URINE
ALB/CREAT RATIO URINE RAN: 15 ug/mg (ref 0–30)
ALBUMIN URINE RANDOM: 1.6 mg/dL (ref 0.0–1.9)
CREATININE RANDOM URINE: 107 mg/dL (ref 28–217)

## 2020-10-20 MED ORDER — IBUPROFEN 800 MG PO TABS
800.00 mg | ORAL_TABLET | Freq: Three times a day (TID) | ORAL | 2 refills | Status: AC
Start: 2020-10-20 — End: 2021-01-18

## 2020-10-20 NOTE — Telephone Encounter (Signed)
PER Pharmacy, Amanda Cervantes is a 55 year old female has requested a refill of VIT D.      Last Office Visit:10/20/20  with Patrice Paradise  Last Physical Exam: 02/22/20    DM EYE EXAM due on 01/13/2021    Other Med Adult:  Most Recent BP Reading(s)  10/20/20 : 118/84        Cholesterol (mg/dL)   Date Value   04/21/2018 144     LOW DENSITY LIPOPROTEIN DIRECT (mg/dL)   Date Value   10/05/2019 91     HIGH DENSITY LIPOPROTEIN (mg/dL)   Date Value   04/21/2018 37 (L)     TRIGLYCERIDES (mg/dL)   Date Value   04/21/2018 164 (H)         THYROID SCREEN TSH REFLEX FT4 (uIU/mL)   Date Value   12/10/2019 1.803         No results found for: TSH    HEMOGLOBIN A1C (%)   Date Value   05/30/2020 8.3 (*H)       POC HEMOGLOBIN A1C (%)   Date Value   10/17/2020 6.5 (H)         No results found for: INR    SODIUM (mmol/L)   Date Value   12/10/2019 143       POTASSIUM (mmol/L)   Date Value   12/10/2019 4.0           CREATININE (mg/dL)   Date Value   12/10/2019 0.7       Documented patient preferred pharmacies:    [facility], [place] [address]  Phone: [phone] Fax: [phone]

## 2020-10-20 NOTE — Progress Notes (Signed)
CC: Diabetes follow-up & herpes    Herpes zoster  Forehead on left headache, she has been seen in ED, has been seen by ophthalmology   Her lesions have been less painful & drying up a bit  She has been taking all her meds - has been taking valacyclovir & prednisone   She notes that the oxycodone helps only a little, has one last pill  Notes that naproxen does not help  Does find that the ibuprofen is helpful       FOLLOW-UP DIABETES  See EPIC Problem List for HPI.    Her last HbA1c was     HEMOGLOBIN A1C (%)   Date Value   05/30/2020 8.3 (*H)   10/05/2019 12.4 (*H)   09/04/2018 9.2 (*H)         HEMOGLOBIN A1C (%)   Date Value   05/30/2020 8.3 (*H)   10/05/2019 12.4 (*H)   09/04/2018 9.2 (*H)       POC HEMOGLOBIN A1C (%)   Date Value   10/17/2020 6.5 (H)   04/21/2018 10.8 (*H)          our primary goal is HgA1c is <8%      Her Most Recent Weight Reading(s)  10/20/20 : 83.5 kg (184 lb)  05/05/20 : 87.1 kg (192 lb 0.3 oz)  02/22/20 : 87.1 kg (192 lb)  10/05/19 : 92.1 kg (203 lb)  04/21/18 : 90.7 kg (200 lb)    She does not report polyuria, polydipsia, blurry vision, chest pain, dyspnea or claudication.  No foot burning, numbness or pain.       She does not report feeling lightheaded or tremulous.    Please see EPIC RHM field for last visit to ophtho & podiatry.    Her last microalbumin was:   ALB/CREAT RATIO URINE RAN (ug/mg)   Date Value   10/05/2019 19     last potassium was   POTASSIUM (mmol/L)   Date Value   12/10/2019 4.0       last creatinine was   CREATININE (mg/dL)   Date Value   12/10/2019 0.7     last   ESTIMATED GLOMERULAR FILT RATE (ML/MIN)   Date Value   12/10/2019 > 60       She reports taking medicines as below   valACYclovir HCl (VALTREX) 1 g tablet, Take 1 tablet by mouth in the morning and 1 tablet before bedtime. Do all this for 10 days., Disp: 20 tablet, Rfl: 0  predniSONE (DELTASONE) 20 MG tablet, Take 2 tablets by mouth in the morning for 4 days., Disp: 8 tablet, Rfl: 0  oxyCODONE (ROXICODONE)  5 MG immediate release tablet, Take 1 tablet by mouth nightly as needed for Pain (severe pain not relieved by other medications) Max Daily Amount: 5 mg  for up to 5 days, Disp: 5 tablet, Rfl: 0  ibuprofen (ADVIL) 600 MG tablet, Take 1 tablet by mouth every 8 (eight) hours as needed pain  for up to 10 days, Disp: 20 tablet, Rfl: 0  erythromycin (ROMYCIN) ophthalmic ointment, Place into the left eye every 6 (six) hours  for 10 days, Disp: 3.5 g, Rfl: 0  naproxen (NAPROSYN) 500 MG tablet, Take 1 tablet by mouth every 12 (twelve) hours as needed for Pain as needed for pain. Take with food  for up to 10 days, Disp: 20 tablet, Rfl: 0  D3-1000 25 MCG (1000 UT) tablet, TAKE 2 TABLETS BY MOUTH DAILY, Disp: 60 tablet, Rfl: 1  dulaglutide (TRULICITY) 1.5 0000000 injection, Inject 1.5 mg under the skin once a week, Disp: 2 mL, Rfl: 11  atorvastatin (LIPITOR) 10 MG tablet, TAKE 1 TABLET BY MOUTH DAILY, Disp: 30 tablet, Rfl: 10  metFORMIN (GLUCOPHAGE-XR) 750 MG 24 hr tablet, Take 2 tablets by mouth nightly, Disp: 60 tablet, Rfl: 10  lisinopril (ZESTRIL) 40 MG tablet, TAKE 1 TABLET BY MOUTH DAILY, Disp: 30 tablet, Rfl: 11  chlorthalidone (HYGROTEN) 25 MG tablet, TAKE 1 TABLET BY MOUTH DAILY, Disp: 30 tablet, Rfl: 11    No current facility-administered medications on file prior to visit.       She is prescribed metformin and the dose is not greater than the maximum dose recommended given her renal function as follows:   GFR >60, 2500 mg;   GFR 45-59, 2000 mg;   GFR 30-44, 1000 mg; reassess need for metformin on yearly basis  GFR <30, avoid   (based on recommendations in JAMA 2014;312:2668)  her last vitamin B12 (cobalamin) level was No results found for: B12     Her goal LDL is to be on a moderate dose statin given that she is >62 y/o or <40 y/o with multiple cardiovascular risk factors, her last LDL was   LOW DENSITY LIPOPROTEIN DIRECT (mg/dL)   Date Value   10/05/2019 91         Her goal BP is <140/85 based on my interpretation  of the following trials of BP control in DM pts: ABCD, HOT, UKPDS & ACCORD (NEJM [id number]), her last BP was Most Recent BP Reading(s)  10/20/20 : 118/84  10/17/20 : (!) 164/105       Review of Patient's Allergies indicates:   Penicillins                 Comment: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  She came to the [place] in 2005       In [place], she worked as a Musician.       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      She lives with boyfriend & daughter; no DV;        she has 2 children, age 91 y/o son in [place] & 72 y/o daughter lives with her    BP 118/84    Pulse 91    Temp 98.4 F (36.9 C) (Temporal)    Wt 83.5 kg (184 lb)    LMP 06/27/2019 (Approximate)    SpO2 98%    BMI 33.12 kg/m   Skin: left upper forehead multiple punctate ulcers each 77m in size in distribution  Heart: S1 and S2 normal, no murmurs, clicks, gallops or rubs. Regular rate and rhythm.   Lungs:  clear; no wheezes, rhonchi or rales.     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  I explained that it should be taken on a full stomach and that it is likely to cause some stomach upset.  I reviewed the importance of stopping the NSAID if she were to notice any new rashes or any change in the color of her stool (either black or red) -- she should call the clinic immediately.     We reviewed that experts  recommend starting zoster vaccine 1 year from now.      The patient was ready to learn and no apparent learning or adherence barriers were identified. I explained the diagnosis and treatment plan, and the patient expressed understanding of the content. I attempted to answer any questions regarding the diagnosis and the proposed treatment.    Possible side effects of the prescribed medication was explained. We discussed the patients current medications.  We discussed the importance of medication compliance. The patient expressed understanding and no barriers to adherence were identified.    follow up 1 week with ophthalmology   follow-up will be scheduled for 1 month from now with pharmacist  she has been advised to call or return with any worsening or new problems

## 2020-10-24 LAB — LIPID PANEL
Cholesterol: 129 mg/dL (ref 0–239)
HIGH DENSITY LIPOPROTEIN: 34 mg/dL — ABNORMAL LOW (ref 40–60)
LOW DENSITY LIPOPROTEIN DIRECT: 74 mg/dL (ref 0–189)
TRIGLYCERIDES: 212 mg/dL — ABNORMAL HIGH (ref 0–150)

## 2020-10-24 LAB — PTH INTACT WITH CALCIUM
PTH INTACT CALCIUM: 11.5 mg/dL — ABNORMAL HIGH (ref 8.5–10.1)
PTH INTACT: 26 pg/mL (ref 15–65)

## 2020-10-24 LAB — BASIC METABOLIC PANEL
ANION GAP: 11 mmol/L (ref 10–22)
BUN (UREA NITROGEN): 23 mg/dL — ABNORMAL HIGH (ref 7–18)
CALCIUM: 11.5 mg/dl — ABNORMAL HIGH (ref 8.5–10.1)
CARBON DIOXIDE: 24 mmol/L (ref 21–32)
CHLORIDE: 102 mmol/L (ref 98–107)
CREATININE: 1 mg/dL (ref 0.4–1.2)
ESTIMATED GLOMERULAR FILT RATE: >60 mL/min (ref >60)
Glucose Random: 288 mg/dL — ABNORMAL HIGH (ref 74–160)
POTASSIUM: 3.8 mmol/L (ref 3.5–5.1)
SODIUM: 136 mmol/L (ref 136–145)

## 2020-10-24 NOTE — Addendum Note (Signed)
Addended by: Gavin Pound on: 10/24/2020 06:47 PM     Modules accepted: Orders

## 2020-10-27 ENCOUNTER — Other Ambulatory Visit: Payer: Self-pay

## 2020-10-27 ENCOUNTER — Ambulatory Visit: Payer: No Typology Code available for payment source | Attending: Ophthalmology | Admitting: Ophthalmology

## 2020-10-27 DIAGNOSIS — B0230 Zoster ocular disease, unspecified: Secondary | ICD-10-CM | POA: Diagnosis present

## 2020-10-27 DIAGNOSIS — E119 Type 2 diabetes mellitus without complications: Secondary | ICD-10-CM

## 2020-10-27 NOTE — Progress Notes (Signed)
10 days f/u      Herpes zoster ophthalmicus, left side. Small corneal dendrite.  No intraocular inflammation.  Diabetes without retinopathy 10/21  Ocular hypertension  Presbyopia    L forehead and LUL area hurts, Erythromycin q6h and pain medication helps.    Took last blue pill this morning.  Ran out of the other Zoster pill 3 days ago.

## 2020-10-27 NOTE — Progress Notes (Signed)
Impression:  Herpes zoster ophthalmicus, left side.  Corneal dendrite resolved.  Mild conjunctival injection without intraocular inflammation.  Diabetes without retinopathy 10/21  Ocular hypertension  Presbyopia    Plan:  Discontinue erythromycin ointment  Call with increased pain or decreased vision  Return for AVF and OCT in 3 months as scheduled  Needs pachymetry

## 2020-11-15 ENCOUNTER — Ambulatory Visit: Payer: No Typology Code available for payment source | Attending: Internal Medicine

## 2020-11-15 DIAGNOSIS — E1169 Type 2 diabetes mellitus with other specified complication: Secondary | ICD-10-CM | POA: Diagnosis present

## 2020-11-15 DIAGNOSIS — E785 Hyperlipidemia, unspecified: Secondary | ICD-10-CM | POA: Diagnosis present

## 2020-11-15 DIAGNOSIS — E1165 Type 2 diabetes mellitus with hyperglycemia: Secondary | ICD-10-CM | POA: Diagnosis present

## 2020-11-15 MED ORDER — ATORVASTATIN CALCIUM 10 MG PO TABS
10.0000 mg | ORAL_TABLET | Freq: Every evening | ORAL | 3 refills | Status: DC
Start: 2020-11-15 — End: 2021-11-23

## 2020-11-15 NOTE — Progress Notes (Signed)
Diabetes Management Follow-Up Visit    Conditions addressed during this visit: Diabetes and Hyperlipidemia    Interpreter used? Yes. [place].    SUBJECTIVE    Amanda Cervantes is a 55 year old female with Type 2, DM.    Med changes per last Pharmacotherapy visit (10/17/20): none    Medications for diabetes include:   1. Dulaglutide (Trulicity) 1.5 mg weekly  2. Metformin XR 750 mg tablets - 2 tablets daily   Denies any nausea or abdominal pain since taking medication in the evening instead of the AM.    Medications for ASCVD prevention include:   1. Atorvastatin 10 mg daily    Side effects from current medication regimen: None currently, patient denies.    Self-Monitoring of Blood Glucose (SMBG): Patient reports the following BG readings via memory:  - Before dinner (4-5pm): 95, 100, 110  - Patient states unable to check FBG due to busy schedule    Signs/symptoms of hypoglycemia: No, patient denies. Denies any BG less than 70.    Signs/symptoms of hyperglycemia: None reported    Diet: Reports trying to improve diet.    Objective:  LMP 06/27/2019 (Approximate)     Most Recent BP Reading(s)  10/20/20 : 118/84  10/17/20 : (!) 164/105  10/11/20 : 128/97    HEMOGLOBIN A1C (%)   Date Value   05/30/2020 8.3 (*H)   10/05/2019 12.4 (*H)   09/04/2018 9.2 (*H)     POC HEMOGLOBIN A1C (%)   Date Value   10/17/2020 6.5 (H)   04/21/2018 10.8 (*H)     LOW DENSITY LIPOPROTEIN DIRECT (mg/dL)   Date Value   10/20/2020 74     CREATININE (mg/dL)   Date Value   10/20/2020 1.0     CREATININE RANDOM URINE   Date Value Ref Range Status   10/20/2020 107 28 - 217 mg/dL Final     ALBUMIN URINE RANDOM   Date Value Ref Range Status   10/20/2020 1.6 0.0 - 1.9 mg/dL Final     ALB/CREAT RATIO URINE RAN   Date Value Ref Range Status   10/20/2020 15 0 - 30 ug/mg Final     Comment:             Interpretive Information for Microalbumin  Normal :               < 30 ug Microalbumin / mg Creatinine  Microalbuminuria:    30-300 ug Microalbumin  / mg Creatinine  Clinical Albuminuria:  >300 ug Microalbumin / mg Creatinine       Assessment/Recommendations:  1. Diabetes  a. Monitoring:   i. Last A1c was at goal (<7%) at 6.5%. Next due: 01/17/21  ii. SMBG: Very limited information available today, but reported before dinner BG readings at goal. If unable to test FBG due to schedule, can test 2-hours PPBG instead at this time.  b. Assessment/Intervention:  i. Medications:   1. Continue dulaglutide (Trulicity) 1.5 mg weekly  2. Continue metformin XR 750 mg tablets - 2 tablets daily. Unable to tolerate higher dose.  ii. Education: lifestyle modifications    2. ASCVD Prevention  a. Monitoring:   i. Last LDL 74. Last TGs 212. Next due: 10/2021  b. Assessment/Intervention:  i. Medications:   1. Continue atorvastatin 10 mg daily   ii. Education: Reviewed lifestyle modifications to improve cholesterol.     3. Renal   a. BUN/SCr/eGFR (10/20/20): 23/1.0/>60   b. Alb/Cr ratio: 15-negative for albuminuria. Next  due: 10/2021      4. Medication Reconciliation   a. Completed today verbally.   b. Refills submitted for the following meds: atorvastatin    Will discuss any lab results at next visit or sooner if clinically indicated.    Next follow-up scheduled for: 01/16/21 at 9 am

## 2020-11-15 NOTE — Progress Notes (Signed)
Lipid panel, UACR, and glucose reviewed with patient during televisit on 11/15/20  See pharm documentation from 11/15/20 for details.

## 2021-01-16 ENCOUNTER — Ambulatory Visit: Payer: No Typology Code available for payment source | Attending: Internal Medicine | Admitting: Pharmacist

## 2021-01-16 ENCOUNTER — Other Ambulatory Visit: Payer: Self-pay

## 2021-01-16 VITALS — BP 118/82

## 2021-01-16 DIAGNOSIS — E119 Type 2 diabetes mellitus without complications: Secondary | ICD-10-CM | POA: Diagnosis not present

## 2021-01-16 LAB — POC A1C: POC HEMOGLOBIN A1C: 6.4 % — ABNORMAL HIGH (ref 4–5.6)

## 2021-01-16 MED ORDER — GLUCOSE BLOOD VI STRP
ORAL_STRIP | 11 refills | Status: AC
Start: 2021-01-16 — End: 2022-01-16

## 2021-01-16 NOTE — Progress Notes (Signed)
Diabetes Management Follow Up Visit      Translator used for this visit: Yes, [place].    Amanda Cervantes is a 55 year old female with type 2 diabetes who presents to clinic for follow-up diabetes management.     Medication reconciliation was completed at the beginning of the visit verbally.  Patient reports (+) adherence to all medications including:    Diabetes Regimen:   Metformin XR 750 mg - 2 tabs daily   Trulicity 1.5 mg weekly    Hypertension Regimen:   Chlorthalidone 25 mg daily    Lisinopril 40 mg daily      Lipid Regimen:   Atorvastatin 10 mg daily        S/sx of hypoglycemia: denies    S/sx of hyperglycemia: denies    Other symptoms/complaints: chronic leg pain at baseline, right hand cramps at bedtime for the past few weeks, and occasionally wakes in the middle of the night to vomit. Patient will schedule PCP visit if continues or worsens.         SMBG: Tests occasionally (fasting or PP). Brought glucometer/log? No.   Patient reports the following:   - Fasting readings around 109  - PP readings around 123        BP Self-monitoring: No      Diet: patient denies change in diet, continues to increase lean protein and vegetable intake and limiting carbs.     Exercise: no formal exercise, but active at work      Most Recent BP Reading(s)  01/16/21 : 118/82  10/20/20 : 118/84  10/17/20 : (!) 164/105  10/11/20 : 128/97        Cholesterol (mg/dL)   Date Value   10/20/2020 129   04/21/2018 144   03/25/2017 186     LOW DENSITY LIPOPROTEIN DIRECT (mg/dL)   Date Value   10/20/2020 74   10/05/2019 91   04/21/2018 90     HIGH DENSITY LIPOPROTEIN (mg/dL)   Date Value   10/20/2020 34 (L)   04/21/2018 37 (L)   03/25/2017 41     TRIGLYCERIDES (mg/dL)   Date Value   10/20/2020 212 (H)   04/21/2018 164 (H)   03/25/2017 156 (H)         HEMOGLOBIN A1C (%)   Date Value   05/30/2020 8.3 (*H)   10/05/2019 12.4 (*H)   09/04/2018 9.2 (*H)       POC HEMOGLOBIN A1C (%)   Date Value   10/17/2020 6.5 (H)   04/21/2018 10.8  (*H)           Assessment/Recommendations:  1. Diabetes  a. Today's A1c was at goal (< 8%) at 6.4 %.  Next due: 6 months   b. Last urine albumin/SCr ratio: negative for albuminuria.  ACE-I/ARB? Yes. lisinopril.  Next due: August, 2023   c. Medications:   i. Continue metformin XR 1500 mg daily  ii. Continue Trulicity 1.5 mg daily  d. SMBG: Check every other day (am fasting and/or 2 hrs after food)   i. Advised patient to call clinic prior to next appt for med adjustments should they experience readings <70 or >250      2. Lipids  a. 10-year ASCVD risk score (Goff DC Jr.,et al.,2013) is: unable to calculate d/t TC <130.    b. Last lipid panel revealed LDL 74 mg/dL.   c. Next lipid panel due: 10/20/21  d. Interventions: continue atorvastatin 10 mg daily      3.  Blood Pressure  a. Blood pressure was at goal (<140/90 per ADA guidelines given 10-year ASCVD risk score <15%; <130/80 per ADA guidelines given 10-year ASCVD risk >15%).  b. Interventions:   i. Continue lisinopril 40 mg daily  ii. Continue chlorthalidone 25 mg daily          Education:   Importance of medication and visit compliance   S/sx of hypoglycemia and how to treat   A1c/SMBG goals   Blood pressure goals   Complications of uncontrolled DM, hypertension, and hyperlipidemia   Diet/exercise as a means of controlling BG  o Encourage patient to increase intake of lean protein and veggies and decrease intake of carbs/sweets  o Encouraged patient to get at least 16min of exercise a day via walking and/or light exercises   o Importance of limiting alcohol           Planned follow-up date: 07/10/21      Instructed patient to call clinic or go to the ER if they are experiencing unusual symptoms.    Patient verbalized understanding of everything discussed and agrees with plan. Patient was given the opportunity to ask questions/voice concerns.    Majority of this visit was spent counseling the patient regarding medication management and chronic disease  education.    Pharmacotherapy tracking for: Diabetes, Hyperlipidemia and Hypertension/Elevated BP

## 2021-01-27 ENCOUNTER — Ambulatory Visit: Payer: No Typology Code available for payment source | Attending: Ophthalmology

## 2021-01-27 ENCOUNTER — Other Ambulatory Visit: Payer: Self-pay

## 2021-01-27 DIAGNOSIS — H40053 Ocular hypertension, bilateral: Secondary | ICD-10-CM | POA: Diagnosis present

## 2021-01-27 NOTE — Progress Notes (Signed)
Procedure performed by technician under supervision of attending MD.

## 2021-03-24 ENCOUNTER — Ambulatory Visit: Payer: No Typology Code available for payment source | Attending: Ophthalmology | Admitting: Ophthalmology

## 2021-03-24 ENCOUNTER — Other Ambulatory Visit (HOSPITAL_BASED_OUTPATIENT_CLINIC_OR_DEPARTMENT_OTHER): Payer: Self-pay | Admitting: Internal Medicine

## 2021-03-24 ENCOUNTER — Other Ambulatory Visit: Payer: Self-pay

## 2021-03-24 DIAGNOSIS — E119 Type 2 diabetes mellitus without complications: Secondary | ICD-10-CM

## 2021-03-24 DIAGNOSIS — H31003 Unspecified chorioretinal scars, bilateral: Secondary | ICD-10-CM | POA: Diagnosis present

## 2021-03-24 DIAGNOSIS — H40013 Open angle with borderline findings, low risk, bilateral: Secondary | ICD-10-CM

## 2021-03-24 DIAGNOSIS — I10 Essential (primary) hypertension: Secondary | ICD-10-CM

## 2021-03-24 NOTE — Progress Notes (Signed)
For diabetic eye exam to evaluate for retinopathy, F/u ocular HTN to evaluate IOP, and f/u HZO OS    She denied eye pain or vision changes    HEMOGLOBIN A1C (%)   Date Value   05/30/2020 8.3 (*H)   10/05/2019 12.4 (*H)   09/04/2018 9.2 (*H)       POC HEMOGLOBIN A1C (%)   Date Value   01/16/2021 6.4 (H)   10/17/2020 6.5 (H)   04/21/2018 10.8 (*H)         Impression,    1. AODM (Metformin, Trulicity) No gross retinopathy    Optos-No heme or exudates    Plan-stressed importance of maintaining tightly controlled BS with aim of HBA1c < 7.0    RTC 1 year         2. 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 CRS OU-    Optos/ultra wide angle fundus photos today revealed a 7 mm hyperpigmented CRS/lesion inferotemporally OD and a 1 dd (2.2 mm) hyperpigmented CRS/lesion inferotemporally OS      5. Mild hyperopia, astigmatism, presbyopia-    Plan-she was refracted and given a prescription to get glasses that she can fill at any optical shop.

## 2021-03-24 NOTE — Progress Notes (Signed)
Pt here for yearly DM exam     BS controlled  (taking Metformin,Trulicity)    Last VF 79/00/92  Last OCT 01/28/11    States when washes face sometimes feels pain when touches left side.      DM- No Retinopathy    Ocular Hypertension    H/O Herpes zoster Ophthalmicus left side-resolved      Optos done    HEMOGLOBIN A1C (%)   Date Value   05/30/2020 8.3 (*H)   10/05/2019 12.4 (*H)   09/04/2018 9.2 (*H)       POC HEMOGLOBIN A1C (%)   Date Value   01/16/2021 6.4 (H)   10/17/2020 6.5 (H)   04/21/2018 10.8 (*H)

## 2021-03-25 NOTE — Telephone Encounter (Signed)
This prescription refill request has passed the Rx Renewal Authorization protocol.  This medication has been approved and sent to the patient's preferred pharmacy.      PER Pharmacy, Amanda Cervantes is a 55 year old female has requested a refill of metformin, chlorthalidone and lisinopril.      Last Office Visit: 10/20/2020 with PCP  Last Physical Exam: 02/22/2020    COLONOSCOPY due on 02/13/2021    Other Med Adult:  Most Recent BP Reading(s)  01/16/21 : 118/82        Cholesterol (mg/dL)   Date Value   10/20/2020 129     LOW DENSITY LIPOPROTEIN DIRECT (mg/dL)   Date Value   10/20/2020 74     HIGH DENSITY LIPOPROTEIN (mg/dL)   Date Value   10/20/2020 34 (L)     TRIGLYCERIDES (mg/dL)   Date Value   10/20/2020 212 (H)         THYROID SCREEN TSH REFLEX FT4 (uIU/mL)   Date Value   12/10/2019 1.803         No results found for: TSH    HEMOGLOBIN A1C (%)   Date Value   05/30/2020 8.3 (*H)       POC HEMOGLOBIN A1C (%)   Date Value   01/16/2021 6.4 (H)         No results found for: INR    SODIUM (mmol/L)   Date Value   10/20/2020 136       POTASSIUM (mmol/L)   Date Value   10/20/2020 3.8           CREATININE (mg/dL)   Date Value   10/20/2020 1.0       Documented patient preferred pharmacies:    [facility], [place] - [address]  Phone: [phone] Fax: [phone]

## 2021-05-25 ENCOUNTER — Other Ambulatory Visit (HOSPITAL_BASED_OUTPATIENT_CLINIC_OR_DEPARTMENT_OTHER): Payer: Self-pay | Admitting: Internal Medicine

## 2021-05-25 NOTE — Telephone Encounter (Signed)
PER Pharmacy, Amanda Cervantes is a 56 year old female has requested a refill of      -  Trulicity       Last Office Visit: 10/20/2020 with Gavin Pound  Last Physical Exam: 02/22/2020     COLONOSCOPY due on 02/13/2021     Other Med Adult:  Most Recent BP Reading(s)  01/16/21 : 118/82        Cholesterol (mg/dL)   Date Value   10/20/2020 129     LOW DENSITY LIPOPROTEIN DIRECT (mg/dL)   Date Value   10/20/2020 74     HIGH DENSITY LIPOPROTEIN (mg/dL)   Date Value   10/20/2020 34 (L)     TRIGLYCERIDES (mg/dL)   Date Value   10/20/2020 212 (H)         THYROID SCREEN TSH REFLEX FT4 (uIU/mL)   Date Value   12/10/2019 1.803         No results found for: TSH    HEMOGLOBIN A1C (%)   Date Value   05/30/2020 8.3 (*H)       POC HEMOGLOBIN A1C (%)   Date Value   01/16/2021 6.4 (H)         No results found for: INR    SODIUM (mmol/L)   Date Value   10/20/2020 136       POTASSIUM (mmol/L)   Date Value   10/20/2020 3.8           CREATININE (mg/dL)   Date Value   10/20/2020 1.0        Documented patient preferred pharmacies:    [facility], [place] - [address] [place]  Phone: [phone] Fax: [phone]

## 2021-07-10 ENCOUNTER — Ambulatory Visit: Payer: No Typology Code available for payment source | Attending: Internal Medicine | Admitting: Pharmacist

## 2021-07-10 ENCOUNTER — Other Ambulatory Visit: Payer: Self-pay

## 2021-07-10 VITALS — BP 136/94

## 2021-07-10 DIAGNOSIS — E119 Type 2 diabetes mellitus without complications: Secondary | ICD-10-CM | POA: Diagnosis present

## 2021-07-10 DIAGNOSIS — I10 Essential (primary) hypertension: Secondary | ICD-10-CM | POA: Insufficient documentation

## 2021-07-10 LAB — POC A1C: POC HEMOGLOBIN A1C: 6.1 % — ABNORMAL HIGH (ref 4–5.6)

## 2021-07-10 NOTE — Progress Notes (Signed)
Diabetes Management Follow-Up Visit      Translator used for this visit: Yes, [place].    Amanda Cervantes is a 56 year old female with type 2 diabetes who presents to clinic for follow-up diabetes management.       Medication reconciliation was completed at the beginning of the visit verbally. Patient reports (+) adherence to all medications, however she did forget to take today's dose of lisinopril and chlorthalidone.     Diabetes Regimen:  ? Metformin XR 750 mg tabs - 1500 mg daily   o Patient unable to tolerate higher doses  ? Trulicity 1.5 mg weekly      Hypertension Regimen:  ? Chlorthalidone 25 mg daily  ? Lisinopril 40 mg daily       Lipid Regimen:  ? Atorvastatin 10 mg daily      S/sx of hypoglycemia: denies    S/sx of hyperglycemia: denies    Other symptoms/complaints: hand pain/cramps for the past 6 months. Patient reports worsening hand pain/cramps at bedtime the past 2 weeks. Patient also reports occasional N/V.          SMBG: Tests occasionally (fasting or PP). Patient unable to recall readings, but reports "they are always good".    ?  ?  BP Self-monitoring: No    Diet: patient denies change in diet, continues to increase lean protein and vegetable intake and limiting carbs.     Exercise: Non-active, lives sedentary lifestyle.       Most Recent BP Reading(s)  07/10/21 : 136/94  01/16/21 : 118/82  10/20/20 : 118/84  10/17/20 : (!) 164/105        Cholesterol (mg/dL)   Date Value   10/20/2020 129   04/21/2018 144   03/25/2017 186     LOW DENSITY LIPOPROTEIN DIRECT (mg/dL)   Date Value   10/20/2020 74   10/05/2019 91   04/21/2018 90     HIGH DENSITY LIPOPROTEIN (mg/dL)   Date Value   10/20/2020 34 (L)   04/21/2018 37 (L)   03/25/2017 41     TRIGLYCERIDES (mg/dL)   Date Value   10/20/2020 212 (H)   04/21/2018 164 (H)   03/25/2017 156 (H)       HEMOGLOBIN A1C (%)   Date Value   05/30/2020 8.3 (*H)   10/05/2019 12.4 (*H)   09/04/2018 9.2 (*H)       POC HEMOGLOBIN A1C (%)   Date Value   01/16/2021 6.4 (H)    10/17/2020 6.5 (H)   04/21/2018 10.8 (*H)           Assessment/Recommendations:  1. Diabetes  a. Today's A1c was at goal (< 8%) at 6.1 %. Next due: 01/09/22   b. Last urine albumin/SCr ratio: negative for albuminuria.  ACE-I/ARB? Yes. lisinopril.  Next due: 10/20/21   c. Medications:   i. Continue metformin XR 1500 mg daily  ii. Continue Trulicity 1.5 mg daily      2. Hypertension  a. Blood pressure was above goal (<130/80 per 2023 ADA guidelines) due to missing her BP meds today.  b. Interventions:  i. Continue lisinopril 40 mg daily  ii. Continue chlorthalidone 25 mg daily    3. Lipids  a. Last lipid panel revealed LDL 74 mg/dL (goal per 2023 ADA guidelines: <70 mg/dl for patients aged 40-75 years at an increased cardiovascular risk; <55 mg/dl for patients aged 42-75 with established cardiovascular disease).  b. Next lipid panel due: 10/20/21  c. Interventions: continue atorvastatin 10  mg daily          Education:  ? Importance of medication and visit compliance  ? A1c/SMBG goals  ? Blood pressure goals  ? Complications of uncontrolled DM, hypertension, and hyperlipidemia  ? Diet/exercise as a means of controlling BG  o Encourage patient to increase intake of lean protein and veggies and decrease intake of carbs/sweets  o Encouraged patient to get at least 44mn of exercise a day via walking and/or light exercises         Planned follow-up date: 01/15/22       Instructed patient to call clinic or go to the ER if they are experiencing unusual symptoms.    Patient verbalized understanding of everything discussed and agrees with plan. Patient was given the opportunity to ask questions/voice concerns.    Majority of this visit was spent counseling the patient regarding medication management and chronic disease education.    Pharmacotherapy tracking for: Diabetes, Hyperlipidemia and Hypertension/Elevated BP

## 2021-08-07 ENCOUNTER — Ambulatory Visit: Payer: No Typology Code available for payment source | Attending: Internal Medicine | Admitting: Internal Medicine

## 2021-08-07 ENCOUNTER — Other Ambulatory Visit: Payer: Self-pay

## 2021-08-07 ENCOUNTER — Encounter (HOSPITAL_BASED_OUTPATIENT_CLINIC_OR_DEPARTMENT_OTHER): Payer: Self-pay | Admitting: Internal Medicine

## 2021-08-07 VITALS — BP 132/76 | HR 88 | Temp 97.5°F | Wt 183.0 lb

## 2021-08-07 DIAGNOSIS — E1169 Type 2 diabetes mellitus with other specified complication: Secondary | ICD-10-CM | POA: Diagnosis present

## 2021-08-07 DIAGNOSIS — G5603 Carpal tunnel syndrome, bilateral upper limbs: Secondary | ICD-10-CM | POA: Insufficient documentation

## 2021-08-07 DIAGNOSIS — E785 Hyperlipidemia, unspecified: Secondary | ICD-10-CM | POA: Insufficient documentation

## 2021-08-07 DIAGNOSIS — E119 Type 2 diabetes mellitus without complications: Secondary | ICD-10-CM | POA: Insufficient documentation

## 2021-08-07 LAB — PTH INTACT WITH CALCIUM
PTH INTACT CALCIUM: 12.1 mg/dL (ref 8.5–10.5)
PTH INTACT: 38 pg/mL (ref 15–65)

## 2021-08-07 LAB — VITAMIN D,25 HYDROXY: VITAMIN D,25 HYDROXY: 53 ng/mL (ref 30.0–100.0)

## 2021-08-07 NOTE — Progress Notes (Signed)
DM & hand tingling    DM  Seeing pharmacist regularly   See EPIC Problem List for HPI.  She reports taking the medications as on her EPIC med list below.  She reports no difficulty with compliance or side-effects  She does not report chest pain, dyspnea, headache, lower extremity edema or localized weakness or paresthesias  Her last three blood pressures in clinic were:  Most Recent BP Reading(s)  08/07/21 : 132/76  07/10/21 : (!) 136/94  01/16/21 : 118/82    Her Most Recent Weight Reading(s)  08/07/21 : 83 kg (183 lb)  10/20/20 : 83.5 kg (184 lb)  05/05/20 : 87.1 kg (192 lb 0.3 oz)  02/22/20 : 87.1 kg (192 lb)      last potassium was   POTASSIUM (mmol/L)   Date Value   10/20/2020 3.8     last creatinine was   CREATININE (mg/dL)   Date Value   10/20/2020 1.0              Hand tingling    She notes that she's been having tingling of her hands x ~6 months  It's from the wrist into her fingers  Right hand worse than left  Thumb, 2nd & 3rd digits worse then 4th/5th, but feels like it's the whole hand at times  Has not had any trauma to her wrist  No wrist swelling or redness    BP 132/76   Pulse 88   Temp 97.5 F (36.4 C) (Temporal)   Wt 83 kg (183 lb)   LMP 06/27/2019 (Approximate)   SpO2 99%   BMI 32.94 kg/m   Heart: S1 and S2 normal, no murmurs, clicks, gallops or rubs. Regular rate and rhythm.   Lungs:  clear; no wheezes, rhonchi or rales.  Hand: decreased sensation on thumb & 2nd digit of right hand; wrist wnl    ASSESSMENT & PLAN:  (E11.9) Type 2 diabetes mellitus without complication, without long-term current use of insulin (HCC)  (primary encounter diagnosis)  Comment: well controlled continue current regimen - continue regimen  Plan: VITAMIN D,25 HYDROXY, PTH INTACT WITH CALCIUM,         COLONOSCOPY            (E11.69,  E78.5) Hyperlipidemia associated with type 2 diabetes mellitus (HCC)  Comment: continue statin      (G56.03) Bilateral carpal tunnel syndrome  No evidence of wrist pathology, cervical  radiculopathy or other symptoms   Comment: most likely carpal tunnel - no clear indication for NSAID's altho' they are sometimes used - given that she's had symptoms x 6 months, will use acetaminophen 1,000 mg PO TID & refer for assessment  Plan: REFERRAL TO ORTHOPEDICS (INT)            (E83.52) Hypercalcemia  Comment: will repeat today        The patient was ready to learn and no apparent learning or adherence barriers were identified. I explained the diagnosis and treatment plan, and the patient expressed understanding of the content. I attempted to answer any questions regarding the diagnosis and the proposed treatment.    Possible side effects of the prescribed medication was explained. We discussed the patient's current medications.  We discussed the importance of medication compliance. The patient expressed understanding and no barriers to adherence were identified.    follow-up will be scheduled for 1 month from now if symptoms not improving  she has been advised to call or return with any worsening or new  problems

## 2021-08-07 NOTE — Progress Notes (Signed)
Paged on call for critical value, calcium 12.1. chart reviewed, known problem, though this is highest recorded value. Seen today, not having acute symptoms, and level not imminently dangerous, defer to PCP, did not call patient.

## 2021-08-08 ENCOUNTER — Encounter (HOSPITAL_BASED_OUTPATIENT_CLINIC_OR_DEPARTMENT_OTHER): Payer: Self-pay

## 2021-08-08 ENCOUNTER — Telehealth (HOSPITAL_BASED_OUTPATIENT_CLINIC_OR_DEPARTMENT_OTHER): Payer: Self-pay | Admitting: Family Medicine

## 2021-08-08 NOTE — Telephone Encounter (Signed)
Paged MD for critical calcium of 12.1. 09:08 am 08/08/21    Plan: televisit with provider tomorrow.     Called patient patient in agreement with plan. Will have phone with her in case provider calls throughout the day.

## 2021-08-09 ENCOUNTER — Telehealth (HOSPITAL_BASED_OUTPATIENT_CLINIC_OR_DEPARTMENT_OTHER): Payer: Self-pay

## 2021-08-09 ENCOUNTER — Other Ambulatory Visit (HOSPITAL_BASED_OUTPATIENT_CLINIC_OR_DEPARTMENT_OTHER): Payer: Self-pay | Admitting: Internal Medicine

## 2021-08-09 ENCOUNTER — Other Ambulatory Visit (HOSPITAL_BASED_OUTPATIENT_CLINIC_OR_DEPARTMENT_OTHER): Payer: Self-pay

## 2021-08-09 ENCOUNTER — Ambulatory Visit: Payer: No Typology Code available for payment source | Attending: Internal Medicine | Admitting: Internal Medicine

## 2021-08-09 ENCOUNTER — Telehealth (HOSPITAL_BASED_OUTPATIENT_CLINIC_OR_DEPARTMENT_OTHER): Payer: Self-pay | Admitting: Internal Medicine

## 2021-08-09 DIAGNOSIS — I10 Essential (primary) hypertension: Secondary | ICD-10-CM

## 2021-08-09 MED ORDER — PEG 3350-KCL-NABCB-NACL-NASULF 236 G PO SOLR
4.0000 L | ORAL | 0 refills | Status: DC
Start: 2021-08-09 — End: 2022-01-25

## 2021-08-09 MED ORDER — AMLODIPINE BESYLATE 2.5 MG PO TABS
2.5000 mg | ORAL_TABLET | Freq: Every day | ORAL | 3 refills | Status: DC
Start: 2021-08-09 — End: 2021-09-14

## 2021-08-09 MED ORDER — SIMETHICONE 80 MG PO TABS
4.0000 | ORAL_TABLET | ORAL | 0 refills | Status: DC
Start: 2021-08-09 — End: 2022-01-25

## 2021-08-09 NOTE — Telephone Encounter (Signed)
Instrues para colonoscopia  Preparao Padro    7 DIAS ANTES DO EXAME         . NO TOMAR: medicamentos anti-inflamatrios no esteroides, como Ibuprofeno, Motrin, Advil, Naproxeno, Aleve, Naprosyn, Meloxicam, entre muitos outros  . Voc pode tomar Tylenol (que  acetaminofeno) para dor  . Alguns estados clnicos exigem que se utilize [place]. No interrompa seu uso antes de falar com o seu profissional de sade.   3 DIAS ANTES DO EXAME       Interrompa o consumo de alimentos ricos em fibras, como milho, feijo, sementes, nozes, pes integrais ou cascas de frutas (pera, [place] etc.)    2  DIAS ANTES DO EXAME  . Faa uma refeio leve antes das 19 horas    1 DIA ANTES DO EXAME  . Inicie um dieta de lquidos claros (lquidos translcidos). Nenhum alimento slido ao longo do dia    So lquidos claros No so lquidos claros   gua, Gatorade, Powerade ou Pedialyte Nada vermelho ou roxo   Caf ou ch preto (sem leite ou creme) Nada alcolico   Sopas claras Nada de leite, creme ou outros produtos lcteos   Schweppes ou Sprite Nada de [place], arroz ou vegetais nas sopas   Suco de [place] Nada de suco com polpa   Gelatina, picols  Nenhum lquido que no seja translcido     . Prepare o laxante: misture o p laxante  gua e coloque na geladeira  .  partir das 18h, beba um copo (240 ml) de laxante a cada 30 minutos, at [employer] do frasco. Permanea perto de um banheiro quando comear a tomar a mistura.    Marland Kitchen s 21h, tome 2 comprimidos contra gases (Simeticona) com 1 copo (240 ml) de lquido claro  . s 22 horas, tome mais 2 comprimidos contra gases (Simeticona) com 1 copo (240 ml) de lquido claro    NO DIA DA COLONOSCOPIA  . De 4 a 5 horas antes do exame, beba a outra metade do frasco de laxante.  muito importante terminar todo o frasco. Dessa maneira, voc conseguir esvaziar o clon para fazer o exame sem problemas.  . Na manh do exame, voc pode tomar seus outros medicamentos nos horrios de costume, com  apenas um gole d'gua. Esses medicamentos incluem comprimidos para presso arterial, anticonvulsivos, remdios para o corao, a tireoide etc.  . No tome comprimidos para diabetes. Caso tenha diabetes, siga as instrues de preparao para colonoscopia contidas no folheto para diabticos  . No beba nada nas 3 horas anteriores ao exame    Preparao para a colonoscopia  Instrues de medicamentos para diabetes     INFORMAES IMPORTANTES SOBRE SEUS REMDIOS PARA DIABETES  Estas so regras gerais. Em caso de dvidas sobre os seus medicamentos para diabetes ou o seu monitoramento, pergunte ao seu prestador de cuidados primrios ou endocrinologista. No espere at a vspera do exame.     Verifique a sua glicemia  . Faa testes ao longo de todo o perodo de preparao  . Verifique antes de tomar lquidos lmpidos e na hora de dormir ou se voc apresentar sintomas  . Se a sua glicemia estiver abaixo de 80 mg/dl: consuma de 15 a 20 g de carboidratos  o Exemplos de 15 a 20 g de carboidratos: 4 oz (120 ml) de Ensure/Boost lmpidos, sucos lmpidos de [place] ou uva branca, gelatina comum adoada ou picol de laranja e gua  o [place] de 10 a 15 minutos e teste novamente. Repita se  ainda estiver abaixo de 80 mg/dl    Se voc toma: Vspera da colonoscopia: Dia da colonoscopia:   Medicamentos para diabetes por via oral Museum/gallery conservator dose habitual pela manh    NO TOME a dose da noite NO TOME antes do procedimento   Medicamentos injetveis (no insulina)   (Byetta, Bydureon, Victoza, Trulicity ou Symlin) Tome a Dance movement psychotherapist dose habitual No caso daqueles tomados todos os dias, NO TOME antes do procedimento    No caso daqueles tomados uma vez por semana (Bydureon, Trulicity), tome como de costume   Insulinas de ao rpida   (Humalog, Novolog, Apidra, Regulares, NPH ou pr-misturadas) Tome [employer] dose habitual  noite NO TOME a dose [employer] a dose noturna somente se voc se alimentar   Insulinas de ao prolongada    (Lantus, Levemir, Degludec, Tresiba) Tome a dose [place] habitual   Tome a dose cheia habitual     Bomba de insulina Continue sua taxa basal. Se as suas glicemias esto consistentemente abaixo de 100, reduza sua taxa basal para 75% da sua taxa habitual*. S faa bolus de correo para glicemias de 509 ou mais. Continue sua taxa basal. Se a sua glicemia em jejum for inferior a 130, reduza sua taxa basal para 75% da sua taxa habitual*. NO FAA bolus de correo na manh do procedimento.   *Tire com o seu endocrinologista quaisquer dvidas sobre como lidar com as definies da sua bomba antes do procedimento            Colonoscopy Instructions  Standard Preparation    7 DAYS BEFORE THE TEST        . DO NOT TAKE: Nonsteroidal anti-inflammatory medications, these include Ibuprofen, Motrin, Advil, Naproxen, Aleve, Naprosyn, Meloxicam, and many others  . You may take Tylenol (which is acetaminophen) for pain  . Some medical conditions require you to stay on Aspirin. Do not stop Aspirin until you speak with your health care provider.   3 DAYS BEFORE THE TEST       Stop eating high fiber foods such as corn, beans, seeds, nuts, whole grain breads, or fruit skins (pear, apple, etc.)    2 DAYS BEFORE THE TEST  . Have a light dinner no later than 7 PM    1 DAY BEFORE THE TEST  . Begin a clear liquid diet - a clear liquid means you can see through it. No solid food for the entire day    Clear Liquids Not Clear Liquids   Water, Gatorade, Powerade, or Pedialyte No red or purple items   Black coffee or tea (no milk, cream) No alcohol   Clear broth No milk, cream, other dairy products   Ginger ale or Sprite No noodles, rice, or vegetables in soup   Apple juice No juice with pulp   Jell-o, popsicles  No liquid you cannot see through     . Prepare the laxative: mix the laxative powder with water, then put it in the refrigerator  . Starting at 6 PM, drink one glass (8 ounces) of laxative every 30 minutes until half the bottle is empty.  Be sure to stay close to a bathroom once you start the prep.   . At 9 PM, take 2 gas pills (simethicone) with 1 glass (8 ounces) of clear liquid  . At 10 PM, take 2 more gas pills (simethicone) with 1 glass (8 ounces) of clear liquid    DAY OF THE COLONOSCOPY  .  4 to 5 hours before your test, drink the remaining half of the bottle of laxative. It is very important to finish the whole gallon. This will empty your colon to complete the test without problems.  . The morning of the test, you can take your other medications at the usual time with only a sip of water. These include blood pressure pills, seizure medications, heart medications, thyroid medications, etc.  . Do not take pills for diabetes. If you have diabetes, please follow the colonoscopy preparation for people with diabetes handout  . Do not drink anything for 3 hours before the test

## 2021-08-09 NOTE — Telephone Encounter (Signed)
Spoke to patient to schedule a colonoscopy.  Procedure scheduled for 01/26/2022 .   Pt reminded of need for ride home and that a nurse will be calling to provide further instructions.

## 2021-08-09 NOTE — Progress Notes (Signed)
Amanda Cervantes is a 13F with a history of type 2 diabetes mellitus, hyperlipidemia, and essential hypertension, who is presenting today for evaluation of hypercalcemia.     Chief Concern: 1) Hypercalcemia     History of Present Illness  1. hypercalcemia  Amanda Cervantes has hypercalcemia that was first noted on 10/05/2019, when her PTH measured 38.2 pg/mL, which is within normal limits, and her calcium measured 10.6 mg/dL, which is considered elevated. Her PTH related peptide was measured to be within normal limits (<2.0 pmol/L) on 11/30/2019. On 10/20/2020, her PTH remained within normal limits (26 pg/mL) while her calcium increased further to 11.6 mg/dL. This year, on 08/07/2021, her PTH remains within normal limits but increased (38 pg/mL), and her calcium has increased to critical levels (12.1 mg/dL).     As a note, although Amanda Cervantes has a history of vitamin D deficiency, her vitamin D levels have stabilized within the normal range since 2020 and most recently (08/07/2021) measure 53 ng/mL, which is within normal limits.     Past Medical and Surgical History  . Hyperlipidemia associated with type 2 diabetes mellitus  . Essential hypertension, benign  . Vitamin D deficiency  . Obesity (BMI 30-39.9)  . Type 2 diabetes mellitus  . Astigmatism of both eyes with presbyopia  . Zoster ophthalmicus - needs zoster vaccine in 8/23  . Diverticulitis of large intestine without perforation or abscess without bleeding  . History of colonic polyps  . Hypercalcemia  . Left knee pain  . Metatarsal deformity, left  . Left leg pain  . Headache disorder  . Rash  . Tobacco dependence  . Plantar plate injury, left, initial encounter     Medications (reviewed today)  . Trulicity, 1.5 GQ/6.7 mL injection, once weekly  . metformin , 750 mg 24 hour tablet, two tablets daily  . Chlorthalidone, 25 mg, once daily  . Lisinopril, 40 mg, once daily  . Glucose blood test strip  . Atorvastatin, 10 mg, once daily  . D3 - 1000 25 mcg (1000 UT),  two tablets daily     Allergies  . Penicillins     Family History  . Mother: Stomach cancer  . Father: Hypertension and MI  . Sister: Hernia  . Sister: Thyroid     Relevant Laboratory and Imaging Results     PTH Intact with Calcium   08/09/2021 - PTH Intact Calcium = 21.1 mg/dL (H); PTH Intact = 38 pg/mL  10/20/2020 - PTH Intact Calcium = 11.5 mg/dL (H); PTH Intact = 26 pg/mL  10/05/2019 - PTH Intact Calcium = 10.6 mg/dL (H); PTH Intact = 38 pg/mL  09/04/2018 - PTH Intact Calcium = 10.0 mg/dL; PTH Intact = 49.7 pg/mL     Hemoglobin A1c (07/10/2021)  Hemoglobin A1c -6.1% (H)      Vitamin D 25-OH  08/07/2021 - 53 ng/mL  12/10/2019 - 47 ng/mL  10/05/2019 - 30 ng/mL     Vitamin D 1,25-Dihydroxy  12/10/2019 - 43.9 pg/mL  04/23/2019 - 25 ng/mL      PTH Related Peptide   11/30/2019 - <2.0 pmol/L  BMP (10/20/2020)  Sodium - 136 mmol/L  Potassium - 3.8 mmol/L  Chloride - 102 mmol/L  Carbon Dioxide - 24 mmol/L  Anion Gap - 11 mmol/L  Calcium - 11.5 mg/dL (H)  Glucose - 288 mg/dL (H)  BUN - 23 mg/dL (H) (H)  Creatinine - 1.0 mg/dL  eGFR - > 60 ml/min     Lipid Panel (10/20/2020)  Cholesterol -  129 mg/dL  Triglycerides - 212 mg/dL (H)  HDL - 34 mg/dL (L)  LDL - 74 mg/dL      Thyroid Screen TSH Reflex FT4 (12/10/2019)  TSH - 1.803 uIU/mL     CBC (11/21/2015)  WBC - 10.5 TH/uL  RBC - 4.15 M/uL  HGB - 11.9 g/dL  HCT - 36.4%  MCV - 87.7 fl  MCH - 28.7 pg  MCHC - 32.7 g/dL  RBC DIST - 40.0 fL  PLT - 174 TH/uL     Assessment and Plan     Amanda Cervantes is a 58F with a history of type 2 diabetes mellitus, hyperlipidemia, and essential hypertension, who is presenting today for evaluation of hypercalcemia.     Problem List: 1) Hypercalcemia    1. Hypercalcemia  Amanda Cervantes has hypercalcemia since 2021 and since then, her PTH levels have remained technically in normal limits while her calcium levels continue to rise up to critical levels (12.1 mg/dL).     Hypercalcemia has many causes, including parathyroid-mediated causes (e.g.  primary hyperparathyroidism, familial hypocalciuric hypercalcemia, etc.) and non-parathyroid mediated causes (e.g. hypercalcemia of malignancy, vitamin D intoxication, and chronic granulomatous disorders). At this time, however, Amanda Cervantes has demonstrated inappropriately normal parathyroid hormone levels (even rising at times) in the context of rising hypercalcemia, suggesting that her hypercalcemia is likely parathyroid-mediated. Under normal circumstances, PTH is expected to appropriately lower in the context of hypercalcemia due to negative feedback from calcium excess. However, in Amanda Cervantes's case, her PTH is inappropriately normal in spite of having elevated calcium levels, which suggests that her parathyroid is mediating her hypercalcemia, otherwise known as primary hyperparathyroidism.     In terms of non-parathyroid causes for hypercalcemia, Amanda Cervantes has demonstrated stable and normal levels of vitamin D, ruling out vitamin D intoxication. She also has no history of chronic granulomatous disorders, making this explanation unlikely. A hypercalcemia of malignancy is a must-not-miss diagnosis, however Amanda Cervantes has no constitutional symptoms of fatigue or unintentional weight loss to support this diagnosis at this time and Amanda Cervantes is relatively asymptomatic from her hypercalcemia. Additionally, primary hyperparathyroidism aligns more with her lab studies. Medications, including thiazide diuretics, can also cause hypercalcemia. Although Amanda Cervantes has been on chlorthalidone steadily since 0454 without complication and her hypercalcemia began in 2021, many years after starting this medication, it is possible that this medication is worsening her hypercalcemia. There are also more miscellaneous causes of hypercalcemia including hyperthyroidism, pheochromocytoma, and adrenal insufficiency. Amanda Cervantes last thyroid screen was on 12/10/2019 and demonstrated normal findings (TSH = 1.803 uIU/mL), making it  less likely that her hypercalcemia is due to hyperthyroidism. However, it might be prudent to re-evaluate for hyperthyroidism in the present.  Amanda Cervantes has no pulsatile symptoms of pheochromocytoma, including flushing, palpitations, sweating, or syncope. Adrenal insufficiency would present with hypotension, hypoglycemia, fatigue, and nausea, all of which do not describe Amanda Cervantes's presentation.      However, when a patient presents with hypercalcemia, it is important that they be evaluated for pseudohypercalcemia (or factitious hypercalcemia), which can occur if the patient has hyperalbuminemia. This can cause an elevation in the serum total calcium concentration due to proportionally increased binding of calcium to albumin without any rise in the serum ionized calcium concentration. Amanda Cervantes albumin was within normal limits (3.6 g/dL) when last checked on 07/11/2015 and has not been checked since.     Plan  . Order albumin test to evaluate for pseudohypercalcemia  . Order TSH to  re-evaluate for thyroid function  . Cease use of chlorthalidone which may be contributing to hypercalcemia  . Refer Amanda Cervantes to endocrinology for evaluation of primary hyperparathyroidism  . Immediate treatment is required for patients with calcium > 14 mg/dL, which Ms. Droz has not met threshold for at this time  HTN - will arrange follow up soon to see BP off chlorthalidone & adjust meds accordingly    Patient seen in concert with Prudencio Burly HMS II.      30 minutes was spent on the day of the visit on some or all of the following activities:  . Preparing to see the patient (eg, review of tests)  . Obtaining and/or reviewing separately obtained history  . Performing a medically appropriate examination and/or evaluation  . Counseling and educating the patient/family/caregiver  . Ordering medications, tests, or procedures  . Referring and communicating with other health care professionals   . Documenting clinical information  in the electronic or other health record  . Independently interpreting results and  communicating results to the patient/family/caregiver  . Care coordination

## 2021-08-10 ENCOUNTER — Ambulatory Visit: Payer: No Typology Code available for payment source | Attending: Internal Medicine

## 2021-08-10 LAB — ALBUMIN: ALBUMIN: 4.5 g/dL (ref 3.4–5.2)

## 2021-08-10 LAB — THYROID SCREEN TSH REFLEX FT4: THYROID SCREEN TSH REFLEX FT4: 1.95 u[IU]/mL (ref 0.270–4.200)

## 2021-08-11 ENCOUNTER — Other Ambulatory Visit: Payer: Self-pay

## 2021-08-15 ENCOUNTER — Encounter (HOSPITAL_BASED_OUTPATIENT_CLINIC_OR_DEPARTMENT_OTHER): Payer: Self-pay

## 2021-08-17 ENCOUNTER — Telehealth (HOSPITAL_BASED_OUTPATIENT_CLINIC_OR_DEPARTMENT_OTHER): Payer: Self-pay | Admitting: Internal Medicine

## 2021-08-17 NOTE — Telephone Encounter (Signed)
Amanda Cervantes is a 67F with a history of type 2 diabetes mellitus, hyperlipidemia, and essential hypertension, who is presenting today for a follow-up telephone encounter regarding her hypercalcemia.    Chief Concern: 1) Hypercalcemia    1) Hypercalcemia   Informed [name] that her albumin and TSH were within normal limits, ruling out pseudohypercalcemia and thyroid dysfunction as the cause of her hypercalcemia   Reiterated that endocrinology will be able to determine the next best steps for her primary hyperparathyroidism   Endocrinology appointment is scheduled for 12/26/2021 with Dr. He    Prudencio Burly, 08/17/2021

## 2021-08-29 ENCOUNTER — Telehealth (HOSPITAL_BASED_OUTPATIENT_CLINIC_OR_DEPARTMENT_OTHER): Payer: Self-pay

## 2021-08-29 NOTE — Telephone Encounter (Signed)
Left a message for patient to call to schedule RN appointment for BP check around 06/26th, per message from [facility]

## 2021-08-29 NOTE — Telephone Encounter (Signed)
-----   Message from [place], [place] sent at 08/09/2021  5:18 PM EDT -----  Regarding: Scheduling RN Visit  Hi,    I hope this message finds you well - I understand that Tomi Bamberger is out on vacation, so I am sending this to the front desk pool. I would like to schedule Ms. Sweetman for a follow-up RN visit to evaluate her blood pressure in approximately 4 weeks, perhaps the week of June 26th.    Thank you,  Selena

## 2021-09-14 ENCOUNTER — Ambulatory Visit: Payer: No Typology Code available for payment source | Attending: Internal Medicine

## 2021-09-14 ENCOUNTER — Other Ambulatory Visit: Payer: Self-pay

## 2021-09-14 VITALS — BP 147/91 | HR 93

## 2021-09-14 DIAGNOSIS — Z0130 Encounter for examination of blood pressure without abnormal findings: Secondary | ICD-10-CM | POA: Diagnosis present

## 2021-09-14 MED ORDER — AMLODIPINE BESYLATE 5 MG PO TABS
5.0000 mg | ORAL_TABLET | Freq: Every day | ORAL | 2 refills | Status: DC
Start: 2021-09-14 — End: 2021-12-08

## 2021-09-14 NOTE — Progress Notes (Signed)
[facility] RN scheduled visit:   HYPERTENSION EDUCATION & BLOOD PRESSURE ASSESSMENT    Pt arrived, was greeted and identified for scheduled Team RN visit for Hypertension follow up   @ [facility]    Allergies, medications and EPIC chart were reviewed and confirmed with pt with assistance of a [place] speaking RN       Pt states she has been having headaches to the back of her head that radiates to her neck   -Experiencing headaches today    3 days ago also had an episode of dizziness for a few seconds, but she was outside in the sun  Also noticed slight swelling of her feet a few days ago  -No swelling today    Medications reviewed and confirmed with pt.   Patient reports taking all prescribed medications as directed:    -amlodipine 2.'5mg'$  daily   -lisinopril '40mg'$  daily       Blood Pressure Readings   Date 09/14/2021 09/14/2021    Blood Pressure LA, sitting 136/86 RA, sitting 155/96 RA, sitting 147/91       Pt has scheduled f/u appointment for 7/21 with nurse  Reviewed/discussed BP and f/u plan of care with Dr. Kris Hartmann     Patient was educated regarding Follow Up Plan for Blood Pressure as advised by Dr. Kris Hartmann :    1)  Medication: dosage change: increase amlodipine to '5mg'$  daily  2)  Dietary sodium restriction  3)  Regular aerobic exercise  4)  Recheck in 3 weeks, sooner should new symptoms or   problems arise.    Pt was instructed to call and report any symptoms of not feeling well, headache, or edema/swelling  Patient verbalized understanding and agrees with plan and will f/u as scheduled

## 2021-10-03 ENCOUNTER — Ambulatory Visit (HOSPITAL_BASED_OUTPATIENT_CLINIC_OR_DEPARTMENT_OTHER): Payer: No Typology Code available for payment source | Admitting: Internal Medicine

## 2021-10-03 ENCOUNTER — Other Ambulatory Visit: Payer: Self-pay

## 2021-10-03 ENCOUNTER — Ambulatory Visit
Admission: RE | Admit: 2021-10-03 | Discharge: 2021-10-03 | Disposition: A | Discharge status: Home / Self Care | Payer: No Typology Code available for payment source | Attending: Internal Medicine | Admitting: Internal Medicine

## 2021-10-03 DIAGNOSIS — E278 Other specified disorders of adrenal gland: Secondary | ICD-10-CM | POA: Diagnosis not present

## 2021-10-03 DIAGNOSIS — E210 Primary hyperparathyroidism: Secondary | ICD-10-CM | POA: Insufficient documentation

## 2021-10-03 LAB — ALKALINE PHOSPHATASE: ALKALINE PHOSPHATASE: 83 U/L (ref 45–117)

## 2021-10-03 LAB — BASIC METABOLIC PANEL
ANION GAP: 9 mmol/L — ABNORMAL LOW (ref 10–22)
BUN (UREA NITROGEN): 18 mg/dL (ref 7–18)
CALCIUM: 11.2 mg/dL — ABNORMAL HIGH (ref 8.5–10.5)
CARBON DIOXIDE: 26 mmol/L (ref 21–32)
CHLORIDE: 107 mmol/L (ref 98–107)
CREATININE: 1 mg/dL (ref 0.4–1.2)
ESTIMATED GLOMERULAR FILT RATE: >60 mL/min (ref >60)
Glucose Random: 124 mg/dL (ref 74–160)
POTASSIUM: 4.1 mmol/L (ref 3.5–5.1)
SODIUM: 142 mmol/L (ref 136–145)

## 2021-10-03 LAB — CALCIUM IONIZED: CALCIUM IONIZED: 6 mg/dL — ABNORMAL HIGH (ref 4.5–5.3)

## 2021-10-03 LAB — PHOSPHORUS: PHOSPHORUS: 2.4 mg/dL — ABNORMAL LOW (ref 2.5–4.9)

## 2021-10-03 LAB — ALBUMIN: ALBUMIN: 4 g/dL (ref 3.4–5.2)

## 2021-10-03 LAB — PTH INTACT WITH CALCIUM
PTH INTACT CALCIUM: 11.2 mg/dL — ABNORMAL HIGH (ref 8.5–10.5)
PTH INTACT: 38 pg/mL (ref 15–65)

## 2021-10-03 NOTE — Progress Notes (Signed)
Gavin Pound, MD  [address]  [place],  [place] [id number]    Dear Dr.Cohen    It was indeed a great pleasure having had this opportunity to see your patient Amanda Cervantes in the endocrinology for the initial consultation regarding the following endocrine-related disorders at your kind request.    Primary hyperparathyroidism [facility])  Hypercalcemia  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[facility])     Patient's information was collected through a careful review of previous record prior to patient arrival as well as the office interview.     HPI:    Amanda Cervantes is a 56 year old female.  Her past medical history was significant for hypertension on lisinopril and chlorthalidone, she was not feeling well several months ago, was found to have elevated calcium and normal PTH, she has been taking vitamin D 2000 units consistently, not on calcium supplementation regularly but taking Tums whenever she has GI discomfort.  She is negative for constipation or hair loss.  Denies any palpitations.  Negative for any history of fragility fracture or kidney stone.  She is not aware when in her family has similar problem.  Her chlorthalidone was discontinued approximately 6 weeks ago and changed to amlodipine, blood pressure control was well.    Patient's past medical history, family history, social history, current medication as well as allergy were also reviewed and updated.     PAST MEDICAL HISTORY.  Patient Active Problem List:     Obesity (BMI 30-39.9)     Essential hypertension, benign     Tobacco dependence     Type 2 diabetes mellitus (HCC)     Left knee pain     Left leg pain     Headache disorder     Diverticulitis of large intestine without perforation or abscess without bleeding     History of colonic polyps     Plantar plate injury, left, initial encounter     Metatarsal deformity, left     Rash     Vitamin D deficiency     Hyperlipidemia associated with type 2 diabetes mellitus ([place])     Astigmatism of both eyes with  presbyopia     Primary hyperparathyroidism ([place])     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  Past Surgical History:  No date: LAPAROSCOPY FULGURATION OVIDUCTS  No date: OB ANTEPARTUM CARE CESAREAN DLVR & POSTPARTUM  No date: TUBAL LIGATION    CURRENT PRESCRIBED MEDICATIONS.  Current Outpatient Medications   Medication Sig   . amLODIPine (NORVASC) 5 MG tablet Take 1 tablet by mouth in the morning.   . polyethylene glycol (GOLYTELY) 236 g suspension Take 4,000 mLs by mouth See Admin Instructions Take as directed prior to your colonoscopy   . Simethicone 80 MG TABS Take 4 tablets by mouth See Admin Instructions Take as directed prior to colonoscopy.   . TRULICITY 1.5 NI/6.2VO injection INJECT 1.5 MG UNDER THE SKIN ONCE A WEEK   . metFORMIN (GLUCOPHAGE-XR) 750 MG 24 hr tablet TAKE 2 TABLETS BY MOUTH NIGHTLY   . lisinopril (ZESTRIL) 40 MG tablet TAKE 1 TABLET BY MOUTH DAILY   . glucose blood test strip Use daily. Dispense test strips covered by insurance.   Marland Kitchen atorvastatin (LIPITOR) 10 MG tablet Take 1 tablet by mouth nightly   . D3-1000 25 MCG (1000 UT) tablet TAKE 2 TABLETS BY MOUTH DAILY     No current facility-administered medications for  this visit.       ALLERGIES.  Review of Patient's Allergies indicates:   Penicillins                 Comment:Took skin 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 & was positive, never             actually took it    FAMILY HISTORY.  Review of patient's family history indicates:  Problem: Cancer - Other      Relation: Mother          Age of Onset: (Not Specified)          Comment: stomach cancer, died  Problem: Heart Disease      Relation: Father          Age of Onset: (Not Specified)          Comment: died age 9 from MI; learned about HTN in 64's  Problem: GI      Relation: Sister          Age of Onset: (Not Specified)          Comment: hernia  Problem: Thyroid      Relation: Sister          Age of Onset: (Not Specified)      SOCIAL HISTORY.  Social History    Tobacco  Use      Smoking status: Former        Packs/day: 0.33        Years: 10.00        Pack years: 3.3        Types: Cigarettes        Quit date: 10/15/2012        Years since quitting: 8.9      Smokeless tobacco: Never      Tobacco comments: quit 3 months ago on 10/15/12    Alcohol use: Yes      Comment: minimal    Drug use: No      REVIEW OF SYSTEMS.   Endocrine  She has normal appetite and stable weight. She denies any heat or cold intolerance.   Constitution:   She denies fatigue, lack of energy, fevers, chills, weight loss or weakness.   Eyes.   She is negative for blurred or double vision, dryness, pain, or redness of eyes.   ENT:   She is negative for headaches, hearing loss, nose bleeds, or sore throat.   Cardiovascular.   She is negative for palpitations, chest pain, orthopnea, claudication, or leg swelling.   Respiratory.  She denies any cough, shortness of breath, or dyspnea on exertion.   Gastrointestinal.   She denies any dysphagia, nausea, vomiting, abdominal pain, constipation or diarrhea.   Genitourinary.  She is negative for polyuria, polydipsia, nocturia, dysuria, hematuria, flank pain or difficulty in urination.  Musculoskeletal.  She denies any cramping, weakness, difficulty walking, myalgias, or joint, neck, or back pain.    Integumentary.  She denies rash, purple striae, easy bruising, abnormal hair growth or acne.  Neurological.   She is negative for tingling, numbness, imbalanced gait or falls.   Psychiatric.  She is negative for depression, anxiety, or suicidal ideation.  Hematologic/Lymphatic.  She denies any bleeding tendencies.  Allergic/Immunologic.  She denies any immune problems.   All other systems reviewed and negative.        __________________________________________________  PHYSICAL EXAMINATIONS:  BP 142/84   Pulse 96   Temp 98.2 F (36.8 C)   Wt 82.6 kg (182 lb)  LMP 06/27/2019 (Approximate)   SpO2 99%   BMI 32.76 kg/m   GENERAL: Not in acute distress. Well developed and  well-nourished.   EYES: Examination of the eyes demonstrated full extraocular movements. Pupils are equal, round, reactive to light and accommodation. There was no lid lag or proptosis. Negative for visual field defect in the office confrontation test.   ENT: Clear with moist mucous membranes without hyperpigmentation or lesions.   NECK: Negative for goiter. No cervical lymphadenopathy was appreciated. Negative for dorsalcervical fat deposition. Negative for acanthosis nigricans and skin tags.   CARDIOVASCULAR: Regular rate and rhythm, normal S1 and S2. Negative for murmur/rubs/gallop  CHEST: Clear to auscultation and percussion. Negative for respiratory distress/rales. Negative for chest tenderness.     GASTROINTESTINAL: Positive bowel sound, soft, nontender. Negative for hepatomegaly.    MUSCULOSKELETAL: Without clubbing, cyanosis or edema. Muscle strength was 5/5 bilateral upper and lower extremities.   SKIN: cool and dry to touch without obvious lesions. Negative for purple striae.   NEUROLOGICAL: Intact with no tremor of the outstretched fingers.  PSYCHIATRIC: Normal affect and responsiveness to questions.       LABORATORIES. -Reviewed.    Component      Latest Ref Rng & Units 08/07/2021 08/10/2021   PTH INTACT CALCIUM      8.5 - 10.5 mg/dL 12.1 (HH)    PTH INTACT      15 - 65 pg/mL 38    VITAMIN D,25 HYDROXY      30.0 - 100.0 ng/mL 53    THYROID SCREEN TSH REFLEX FT4      0.270 - 4.200 uIU/mL  1.950   ALBUMIN      3.4 - 5.2 g/dL  4.5       RADIOLOGY.     11/2015    Adrenal: The right adrenal gland is normal. There is a small nodule in    the left adrenal incompletely characterized in the contrast study.      ASSESSMENT    (E21.0) Primary hyperparathyroidism ([place])  We will first need to establish the diagnoses, patient stopped taking chlorthalidone since approximately 6 weeks ago also suggest that she have repeat blood test together with the 24-hour urine calcium collection, patient is willing to consider  surgical intervention if the diagnoses of primary hyperparathyroidism can be established and she meets the criteria.  So we will first need to establish the diagnoses and if she does have the diagnoses then the neck step will be to find out whether she meets the criteria, this will include bone density scan including the forearm, and renal ultrasound examination.    Plan: PTH INTACT WITH CALCIUM, ALBUMIN, CALCIUM         IONIZED, ALKALINE PHOSPHATASE, PHOSPHORUS,         BASIC METABOLIC PANEL, CALCIUM 24 HOUR URINE            (E83.52) Hypercalcemia  The probability for primary hyperparathyroidism is high however, the investigation cannot be continued when she was on chlorthalidone, this was discontinued 6 weeks ago, will have laboratory test to confirm the diagnosis.  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 Patrice Paradise.      PLANS:    1.  Patient stopped chlorthalidone since 6 weeks ago, will obtain laboratory test for PTH with calcium, albumin, ionized calcium, alkaline phosphatase, phosphorus, BMP and 24-hour urine calcium.  Vitamin D was sufficient  2.  Patient can continue to take 2000 units of vitamin D per day, suggest that she does not take Tums at this time  3.  We will reach out to the patient for the next step of action once we have the laboratory test.    4.  Incidental finding of potential left adrenal nodule, will let Dr. Patrice Paradise know may need to have a repeat CT scan with and without contrast using adrenal protocol to further characterize of the lesion.    Follow-up visit in 6 weeks    I spent a total of 60 minutes on this visit on the date of service (total  time includes all activities performed on the date of service)    Thank you for allowing me to particiate in the care of this interesting patient. I will continue to follow. Please let me know if you have further questions. Due to the language barrier, this office visit was conducted with the assistance of a telephone [place] interpreter.   All questions answered and concerns addressed.    This encounter note was created using a voice recognition software. Please excuse any typographical error that have not yet been reviewed and corrected.    Sincerely       Adriane Gabbert H Calvary Difranco, MD  ENDOCRINOLOGY, DIABETES, & METABOLISM

## 2021-10-04 ENCOUNTER — Ambulatory Visit
Admission: RE | Admit: 2021-10-04 | Discharge: 2021-10-04 | Disposition: A | Discharge status: Home / Self Care | Payer: No Typology Code available for payment source | Attending: Family Medicine | Admitting: Family Medicine

## 2021-10-04 ENCOUNTER — Other Ambulatory Visit: Payer: Self-pay

## 2021-10-06 ENCOUNTER — Encounter (HOSPITAL_BASED_OUTPATIENT_CLINIC_OR_DEPARTMENT_OTHER): Payer: No Typology Code available for payment source

## 2021-10-06 ENCOUNTER — Ambulatory Visit: Payer: No Typology Code available for payment source | Admitting: Internal Medicine

## 2021-10-06 DIAGNOSIS — E278 Other specified disorders of adrenal gland: Secondary | ICD-10-CM

## 2021-10-06 NOTE — Progress Notes (Signed)
We discussed the finding that was noted by endo:  7/23 endo: 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([place])  When I review her information there seems to be a description of left adrenal nodule without characterization back in the CT scan in 2017, I think she needs to have a repeat CT scan    He recommended repeating the CT - which I have ordered & she understands reasons why    I will also add 24-hour urine collection for cortisol as she is doing a 24 hr urine collection for calcium anyway, so we can evaluate for cushing's in the process    20 minutes was spent on the day of the visit on some or all of the following activities:  . Preparing to see the patient (eg, review of tests)  . Obtaining and/or reviewing separately obtained history  . Performing a medically appropriate examination and/or evaluation  . Counseling and educating the patient/family/caregiver  . Ordering medications, tests, or procedures  . Referring and communicating with other health care professionals   . Documenting clinical information in the electronic or other health record  . Independently interpreting results and  communicating results to the patient/family/caregiver  . Care coordination

## 2021-10-09 ENCOUNTER — Other Ambulatory Visit: Payer: Self-pay

## 2021-10-09 ENCOUNTER — Ambulatory Visit
Admission: RE | Admit: 2021-10-09 | Discharge: 2021-10-09 | Disposition: A | Discharge status: Home / Self Care | Payer: No Typology Code available for payment source | Attending: Family Medicine | Admitting: Family Medicine

## 2021-10-09 DIAGNOSIS — E210 Primary hyperparathyroidism: Secondary | ICD-10-CM | POA: Insufficient documentation

## 2021-10-09 DIAGNOSIS — E278 Other specified disorders of adrenal gland: Secondary | ICD-10-CM | POA: Diagnosis present

## 2021-10-10 LAB — CALCIUM 24 HOUR URINE
CALCIUM 24HR URINE mg/dl: 19 mg/dL
CALCIUM 24HR URINE: 423 mg/24HR — ABNORMAL HIGH (ref 42–353)
CREATININE 24HR URINE MG%: 64 mg/dL
CREATININE 24HR URINE: 1.4 g/(24.h) (ref 0.6–1.8)
TOTAL VOLUME 24 HOUR URINE: 2225 ml/24HR — ABNORMAL HIGH (ref 600–1600)

## 2021-10-10 LAB — CREATININE 24 HOUR URINE
CREATININE 24HR URINE MG%: 65 mg/dL
CREATININE 24HR URINE: 1.4 g/(24.h) (ref 0.6–1.8)
TOTAL VOLUME 24 HOUR URINE: 2225 ml/24HR — ABNORMAL HIGH (ref 600–1600)

## 2021-10-11 ENCOUNTER — Other Ambulatory Visit (HOSPITAL_BASED_OUTPATIENT_CLINIC_OR_DEPARTMENT_OTHER): Payer: Self-pay | Admitting: Internal Medicine

## 2021-10-11 ENCOUNTER — Encounter (HOSPITAL_BASED_OUTPATIENT_CLINIC_OR_DEPARTMENT_OTHER): Payer: Self-pay | Admitting: Internal Medicine

## 2021-10-11 DIAGNOSIS — E210 Primary hyperparathyroidism: Secondary | ICD-10-CM

## 2021-10-16 ENCOUNTER — Other Ambulatory Visit: Payer: Self-pay

## 2021-10-16 ENCOUNTER — Ambulatory Visit: Payer: No Typology Code available for payment source | Attending: Specialist | Admitting: Specialist

## 2021-10-16 ENCOUNTER — Encounter (HOSPITAL_BASED_OUTPATIENT_CLINIC_OR_DEPARTMENT_OTHER): Payer: Self-pay | Admitting: Internal Medicine

## 2021-10-16 DIAGNOSIS — M25532 Pain in left wrist: Secondary | ICD-10-CM | POA: Diagnosis present

## 2021-10-16 DIAGNOSIS — M25531 Pain in right wrist: Secondary | ICD-10-CM | POA: Diagnosis present

## 2021-10-16 DIAGNOSIS — R202 Paresthesia of skin: Secondary | ICD-10-CM | POA: Diagnosis present

## 2021-10-16 DIAGNOSIS — G5603 Carpal tunnel syndrome, bilateral upper limbs: Secondary | ICD-10-CM | POA: Insufficient documentation

## 2021-10-16 DIAGNOSIS — R200 Anesthesia of skin: Secondary | ICD-10-CM | POA: Insufficient documentation

## 2021-10-16 LAB — CORTISOL FREE 24 HOUR URINE
CORTISOL FREE 24 HOUR URINE: 31 ug/24 hr (ref 6–42)
CORTISOL FREE URINE: 14 ug/L
TOTAL VOLUME 24 HOUR URINE: 2225

## 2021-10-16 NOTE — Progress Notes (Signed)
Orthopedic Office Note    CC: Patient presents with:  Hand Pain: Both hands      ORTHOPEDIC PROBLEM LIST:  1. Bilateral hand numbness and tingling  2. Bilateral wrist pain  3. Bilateral CTS, R>L    HPI: Amanda Cervantes is a 56 year old RHD, [place] speaking female who presents today for initial orthopedic evaluation of bilateral hand numbness, tingling, bilateral wrist pain that began atraumatically over 6 months ago. Pain is localized at the level of the carpal tunnel; numbness and tingling encompasses all fingers except the small on both hands. Right significantly worse than the left. 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 Her symptoms now are temporally related to another set of symptoms including body aches and pains for which she is undergoing a work up for Cushing's disease. She also has known DM; most recent HbA1C 6.2. Worse at nighttime; wakes up 3-4x a night for 5-10 minutes each due to numbness. +dropping items and issues with FMM on the right. Feels some weakness in the right hand. Has tried volar wrist splinting bilaterally for 30 days wearing the splints 23/7; did help a bit. Currently employed doing Public affairs consultant; does have some symptoms with planting flowers or using a shovel.    A [place] interpreter was used for the duration of visit via telephone.      PMH: Past Medical History:  06/22/2017: Astigmatism of both eyes with presbyopia  No date: HTN (hypertension)    FH:  Review of patient's family history indicates:  Problem: Cancer - Other      Relation: Mother          Age of Onset: (Not Specified)          Comment: stomach cancer, died  Problem: Heart Disease      Relation: Father          Age of Onset: (Not Specified)          Comment: died age 63 from MI; learned about HTN in 77's  Problem: GI      Relation: Sister          Age of Onset: (Not Specified)           Comment: hernia  Problem: Thyroid      Relation: Sister          Age of Onset: (Not Specified)      Surgical HX: Past Surgical History:  No date: LAPAROSCOPY FULGURATION OVIDUCTS  No date: OB ANTEPARTUM CARE CESAREAN DLVR & POSTPARTUM  No date: TUBAL LIGATION    SH:   Social History     Socioeconomic History   . Marital status: Legally Separated     Spouse name: Not on file   . Number of children: Not on file   . Years of education: Not on file   . Highest education level: Not on file   Occupational History   . Not on file   Tobacco Use   . Smoking status: Former     Packs/day: 0.33     Years: 10.00     Pack years: 3.30     Types: Cigarettes     Quit date: 10/15/2012     Years since quitting: 9.0   . Smokeless tobacco: Never   . Tobacco comments:     quit 3 months ago on 10/15/12   Substance  and Sexual Activity   . Alcohol use: Yes     Comment: minimal   . Drug use: No   . Sexual activity: Yes     Partners: Male     Birth control/protection: Tubal Ligation   Other Topics Concern   . Not on file   Social History Narrative    she is originally from [place] ,[place].  She came to the [place] in 2005     In [place], she worked as a Musician.     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    She lives with boyfriend & daughter; no DV;      she has 2 children, age 64 y/o son in [place] & 20 y/o daughter lives with her   Social Determinants of Librarian, academic Strain: Not on file  Food Insecurity: Not on file  Transportation Needs: Not on file  Physical Activity: Not on file  Stress: Not on file  Social Connections: Not on file  Intimate Partner Violence: Not on file  Housing Stability: Not on file    Allergies: Review of Patient's Allergies indicates:   Penicillins                 Comment:Took skin 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 & was positive, never             actually took it    Current Medications:   Current Outpatient Medications:   .  amLODIPine (NORVASC) 5 MG tablet, Take 1 tablet by mouth  in the morning., Disp: 30 tablet, Rfl: 2  .  polyethylene glycol (GOLYTELY) 236 g suspension, Take 4,000 mLs by mouth See Admin Instructions Take as directed prior to your colonoscopy, Disp: 4000 mL, Rfl: 0  .  Simethicone 80 MG TABS, Take 4 tablets by mouth See Admin Instructions Take as directed prior to colonoscopy., Disp: 4 tablet, Rfl: 0  .  TRULICITY 1.5 SE/8.3TD injection, INJECT 1.5 MG UNDER THE SKIN ONCE A WEEK, Disp: 2 mL, Rfl: 10  .  metFORMIN (GLUCOPHAGE-XR) 750 MG 24 hr tablet, TAKE 2 TABLETS BY MOUTH NIGHTLY, Disp: 180 tablet, Rfl: 3  .  lisinopril (ZESTRIL) 40 MG tablet, TAKE 1 TABLET BY MOUTH DAILY, Disp: 90 tablet, Rfl: 3  .  glucose blood test strip, Use daily. Dispense test strips covered by insurance., Disp: 50 strip, Rfl: 11  .  atorvastatin (LIPITOR) 10 MG tablet, Take 1 tablet by mouth nightly, Disp: 90 tablet, Rfl: 3  .  D3-1000 25 MCG (1000 UT) tablet, TAKE 2 TABLETS BY MOUTH DAILY, Disp: 180 tablet, Rfl: 3    PHYSICAL EXAM:  GENERAL: Alert and oriented, in no acute distress  MOOD: Appropriate.   MUSCULOSKELETAL: Evaluation of bilateral hands shows no gross bony abnormalities. Mild thenar wasting, worse on the right than on the left. No additional overlying skin changes; skin is warm, dry, intact. NTTP throughout. Full ROM of the wrists b/l. Readily able to flex and extend all fingers with and without resistance without deficit. - Tinel's bilaterally. + Durkin's, Phalen's b/l.  Deep and superficial sensation intact and equal bilaterally.  2+ cap refill in all fingers, 2+ radial pulses bilaterally.    ASSESSMENT/PLAN: 56 year old female presenting today for initial orthopedic evaluation of bilateral hand numbness and tingling with associated wrist pain.  Clinical exam today significant for carpal tunnel syndrome.  However, cannot rule out other metabolic component to her symptoms.  At this time would recommend  an EMG for further evaluation.  She will continue to wear her splints at nighttime.   We will see her back after the EMG for further evaluation.    We reviewed the likely diagnosis, the prognosis, and various treatment options in detail. Risks and benefits of treatment plan discussed. The patient's questions have been answered, and the patient understands agrees with treatment plan.     Dr. Adelene Amas saw and examined the patient and formulated the assessment and plan. Please see his dictated note for further information.    This note was prepared using voice recognition software. Please disregard any transcription errors.    Belinda Fisher, PA-C, 10/16/2021      Nursing Communication:  ___ Mena Goes:  ___ XOA in splint/cast  ___ XOA out of splint/cast  ___ Cast removal   _x__EMG review  ___ Surgical booking (vitals)  ___ None

## 2021-10-18 NOTE — Progress Notes (Signed)
I saw this patient in conjunction with Belinda Fisher PA-C.  I am in agreement with the evaluation, assessment and plan.  I personally interviewed this patient, performed a physical examination, discussed the findings and diagnosis with the patient and made recommendations relative to treatment and plan.     In brief, this patient is here regarding bilateral hand pain with numbness and tingling.  She notes wrist pain, as well.  She has had this problem for at least 6 months.  Notable symptoms at night.  She has had some clumsiness and difficulty with fine motor activity.  Splinting has been somewhat helpful.  She does have diabetes mellitus but fairly well controlled with A1c at 6.2.  No history of thyroid disease.  She is being worked up for Cushing's disease.    Exam is notable for negative Tinel's sign over the carpal canal.  Positive Phalen's test bilaterally.  Sensibility okay.  Hand well-perfused.    This patient has bilateral hand pain, wrist pain with numbness and tingling.  Findings are consistent with potential carpal tunnel syndrome.  Further evaluation will be undertaken with nerve conduction studies.  I will see you in follow-up after these are complete.  In the meantime she will continue with her splinting regimen at night.    Orson Ape. Adelene Amas, MD  October 18, 2021

## 2021-10-20 ENCOUNTER — Encounter (HOSPITAL_BASED_OUTPATIENT_CLINIC_OR_DEPARTMENT_OTHER): Payer: No Typology Code available for payment source

## 2021-11-03 ENCOUNTER — Other Ambulatory Visit: Payer: Self-pay

## 2021-11-03 ENCOUNTER — Ambulatory Visit
Admission: RE | Admit: 2021-11-03 | Discharge: 2021-11-03 | Disposition: A | Discharge status: Home / Self Care | Payer: No Typology Code available for payment source | Attending: Radiology | Admitting: Radiology

## 2021-11-03 ENCOUNTER — Inpatient Hospital Stay (HOSPITAL_BASED_OUTPATIENT_CLINIC_OR_DEPARTMENT_OTHER): Admit: 2021-11-03 | Payer: No Typology Code available for payment source

## 2021-11-03 ENCOUNTER — Ambulatory Visit (HOSPITAL_BASED_OUTPATIENT_CLINIC_OR_DEPARTMENT_OTHER)
Admit: 2021-11-03 | Discharge: 2021-11-03 | Disposition: A | Discharge status: Home / Self Care | Payer: No Typology Code available for payment source

## 2021-11-03 DIAGNOSIS — E210 Primary hyperparathyroidism: Secondary | ICD-10-CM | POA: Diagnosis not present

## 2021-11-03 MED ORDER — TECHNETIUM TC-99M SESTAMIBI (CARDIOLITE) INJECTION
9.00 | Freq: Once | INTRAVENOUS | Status: AC
Start: 2021-11-03 — End: 2021-11-03
  Administered 2021-11-03: 15.2 via INTRAVENOUS

## 2021-11-07 ENCOUNTER — Encounter (HOSPITAL_BASED_OUTPATIENT_CLINIC_OR_DEPARTMENT_OTHER): Payer: Self-pay | Admitting: Internal Medicine

## 2021-11-22 ENCOUNTER — Other Ambulatory Visit: Payer: Self-pay | Admitting: Internal Medicine

## 2021-11-22 ENCOUNTER — Other Ambulatory Visit (HOSPITAL_BASED_OUTPATIENT_CLINIC_OR_DEPARTMENT_OTHER): Payer: Self-pay | Admitting: Internal Medicine

## 2021-11-23 ENCOUNTER — Telehealth (HOSPITAL_BASED_OUTPATIENT_CLINIC_OR_DEPARTMENT_OTHER): Payer: Self-pay

## 2021-11-23 NOTE — Telephone Encounter (Signed)
Gavin Pound, MD  [place] team,  Please call patient to have a follow-up RN visit for "zoster vaccine" in next few weeks  Much thanks,  Jolaine Artist, [place], 11/23/2021  Spoke to patient and verified name and date of birth. Made appt and patient aware.

## 2021-11-23 NOTE — Telephone Encounter (Signed)
[facility], Amanda Cervantes is a 56 year old female has requested a refill of vitamin D and atorvastatin.      Last Office Visit: 10/06/21 with Rosalie Doctor  Last Physical Exam: 02/22/20    There are no preventive care reminders to display for this patient.    Other Med Adult:  Most Recent BP Reading(s)  10/03/21 : 142/84        Cholesterol (mg/dL)   Date Value   10/20/2020 129     LOW DENSITY LIPOPROTEIN DIRECT (mg/dL)   Date Value   10/20/2020 74     HIGH DENSITY LIPOPROTEIN (mg/dL)   Date Value   10/20/2020 34 (L)     TRIGLYCERIDES (mg/dL)   Date Value   10/20/2020 212 (H)         THYROID SCREEN TSH REFLEX FT4 (uIU/mL)   Date Value   08/10/2021 1.950         No results found for: TSH    HEMOGLOBIN A1C (%)   Date Value   05/30/2020 8.3 (*H)       POC HEMOGLOBIN A1C (%)   Date Value   07/10/2021 6.1 (H)         No results found for: INR    SODIUM (mmol/L)   Date Value   10/03/2021 142       POTASSIUM (mmol/L)   Date Value   10/03/2021 4.1           CREATININE (mg/dL)   Date Value   10/03/2021 1.0       Documented patient preferred pharmacies:    [facility], [place] - [address] [place]. [address]  Phone: [phone] Fax: [phone]

## 2021-11-30 ENCOUNTER — Telehealth (HOSPITAL_BASED_OUTPATIENT_CLINIC_OR_DEPARTMENT_OTHER): Payer: Self-pay

## 2021-11-30 ENCOUNTER — Other Ambulatory Visit: Payer: Self-pay

## 2021-11-30 ENCOUNTER — Ambulatory Visit
Admission: RE | Admit: 2021-11-30 | Discharge: 2021-11-30 | Disposition: A | Discharge status: Home / Self Care | Payer: No Typology Code available for payment source | Attending: Diagnostic Radiology | Admitting: Diagnostic Radiology

## 2021-11-30 DIAGNOSIS — E079 Disorder of thyroid, unspecified: Secondary | ICD-10-CM | POA: Insufficient documentation

## 2021-11-30 DIAGNOSIS — E042 Nontoxic multinodular goiter: Secondary | ICD-10-CM | POA: Insufficient documentation

## 2021-11-30 DIAGNOSIS — E210 Primary hyperparathyroidism: Secondary | ICD-10-CM | POA: Diagnosis present

## 2021-11-30 NOTE — Telephone Encounter (Signed)
Nurse from [facility] called the [facility] to complete a benefit analysis for the shingrix Vaccine.    The vaccine is covered under the patient's [place] medical coverage  Please choose  (Private)    If you do not have the vaccine in stock, please contact the [facility] department [phone]) to have the vaccine delivered to your clinic

## 2021-12-01 ENCOUNTER — Ambulatory Visit
Payer: No Typology Code available for payment source | Attending: Licensed Practical Nurse | Admitting: Licensed Practical Nurse

## 2021-12-01 DIAGNOSIS — Z23 Encounter for immunization: Secondary | ICD-10-CM | POA: Diagnosis not present

## 2021-12-01 NOTE — Progress Notes (Signed)
12/01/2021  VIS given prior to administration and reviewed with the patient and or legal guardian. Patient understands the disease and the vaccine. See immunization/Injection module or chart review for date of publication and additional information.  Tressie Ellis, LPN

## 2021-12-02 ENCOUNTER — Encounter (HOSPITAL_BASED_OUTPATIENT_CLINIC_OR_DEPARTMENT_OTHER): Payer: Self-pay | Admitting: Internal Medicine

## 2021-12-07 ENCOUNTER — Telehealth (HOSPITAL_BASED_OUTPATIENT_CLINIC_OR_DEPARTMENT_OTHER): Payer: Self-pay

## 2021-12-07 NOTE — Telephone Encounter (Signed)
he was supposed to have a CT abd scheduled - can you see what's going on with that? Thanks, [employer]

## 2021-12-07 NOTE — Telephone Encounter (Signed)
Pt. Reached. Appt. Scheduled.

## 2021-12-08 ENCOUNTER — Ambulatory Visit (HOSPITAL_BASED_OUTPATIENT_CLINIC_OR_DEPARTMENT_OTHER): Payer: No Typology Code available for payment source | Admitting: Internal Medicine

## 2021-12-08 ENCOUNTER — Other Ambulatory Visit: Payer: Self-pay

## 2021-12-08 ENCOUNTER — Ambulatory Visit
Admission: RE | Admit: 2021-12-08 | Discharge: 2021-12-08 | Disposition: A | Discharge status: Home / Self Care | Payer: No Typology Code available for payment source | Attending: Internal Medicine | Admitting: Internal Medicine

## 2021-12-08 VITALS — BP 174/106 | HR 63 | Temp 97.5°F | Resp 18 | Wt 181.0 lb

## 2021-12-08 DIAGNOSIS — E278 Other specified disorders of adrenal gland: Secondary | ICD-10-CM

## 2021-12-08 DIAGNOSIS — I10 Essential (primary) hypertension: Secondary | ICD-10-CM | POA: Diagnosis not present

## 2021-12-08 DIAGNOSIS — E210 Primary hyperparathyroidism: Secondary | ICD-10-CM | POA: Diagnosis not present

## 2021-12-08 LAB — BASIC METABOLIC PANEL
ANION GAP: 6 mmol/L — ABNORMAL LOW (ref 10–22)
BUN (UREA NITROGEN): 15 mg/dL (ref 7–18)
CALCIUM: 11.2 mg/dL — ABNORMAL HIGH (ref 8.5–10.5)
CARBON DIOXIDE: 27 mmol/L (ref 21–32)
CHLORIDE: 107 mmol/L (ref 98–107)
CREATININE: 0.8 mg/dL (ref 0.4–1.2)
ESTIMATED GLOMERULAR FILT RATE: >60 mL/min (ref >60)
Glucose Random: 89 mg/dL (ref 74–160)
POTASSIUM: 4.2 mmol/L (ref 3.5–5.1)
SODIUM: 140 mmol/L (ref 136–145)

## 2021-12-08 LAB — ALBUMIN: ALBUMIN: 4.5 g/dL (ref 3.4–5.2)

## 2021-12-08 MED ORDER — AMLODIPINE BESYLATE 10 MG PO TABS
10.0000 mg | ORAL_TABLET | Freq: Every day | ORAL | 2 refills | Status: AC
Start: 2021-12-08 — End: 2022-12-14

## 2021-12-08 MED ORDER — DEXAMETHASONE 1 MG PO TABS
1.0000 mg | ORAL_TABLET | Freq: Once | ORAL | 0 refills | Status: AC
Start: 2021-12-08 — End: 2021-12-08

## 2021-12-08 NOTE — Progress Notes (Signed)
This is a follow up visit regarding    Essential hypertension, benign  Primary hyperparathyroidism ([place])  Hypercalcemia  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[facility])    LAST VISIT: 10/03/2021     HPI:    Amanda Cervantes is a 56 year old female.  Her past medical history was significant for hypertension on lisinopril and chlorthalidone, she was not feeling well several months ago, was found to have elevated calcium and normal PTH, she has been taking vitamin D 2000 units consistently, not on calcium supplementation regularly but taking Tums whenever she has GI discomfort.  She is negative for constipation or hair loss.  Denies any palpitations.  Negative for any history of fragility fracture or kidney stone.  She is not aware when in her family has similar problem.  Her chlorthalidone was discontinued approximately 6 weeks ago and changed to amlodipine, blood pressure control was well.      10/03/2021     Since last visit has finish the request the laboratory test and bone density scan.  She has also had localization study.    Patient's past medical history, family history, social history, current medication as well as allergy were also reviewed and updated.     PAST MEDICAL HISTORY.  Patient Active Problem List:     Obesity (BMI 30-39.9)     Essential hypertension, benign     Tobacco dependence     Type 2 diabetes mellitus (HCC)     Left knee pain     Left leg pain     Headache disorder     Diverticulitis of large intestine without perforation or abscess without bleeding     History of colonic polyps     Plantar plate injury, left, initial encounter     Metatarsal deformity, left     Rash     Vitamin D deficiency     Hyperlipidemia associated with type 2 diabetes mellitus ([place])     Astigmatism of both eyes with presbyopia     Primary hyperparathyroidism ([place])     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  Past Surgical History:  No date: LAPAROSCOPY  FULGURATION OVIDUCTS  No date: OB ANTEPARTUM CARE CESAREAN DLVR & POSTPARTUM  No date: TUBAL LIGATION    CURRENT PRESCRIBED MEDICATIONS.  Current Outpatient Medications   Medication Sig    D3-1000 25 MCG (1000 UT) tablet TAKE 2 TABLETS BY MOUTH DAILY    atorvastatin (LIPITOR) 10 MG tablet Take 1 tablet by mouth nightly    amLODIPine (NORVASC) 5 MG tablet Take 1 tablet by mouth in the morning.    polyethylene glycol (GOLYTELY) 236 g suspension Take 4,000 mLs by mouth See Admin Instructions Take as directed prior to your colonoscopy    Simethicone 80 MG TABS Take 4 tablets by mouth See Admin Instructions Take as directed prior to colonoscopy.    TRULICITY 1.5 YQ/6.5HQ injection INJECT 1.5 MG UNDER THE SKIN ONCE A WEEK    metFORMIN (GLUCOPHAGE-XR) 750 MG 24 hr tablet TAKE 2 TABLETS BY MOUTH NIGHTLY    lisinopril (ZESTRIL) 40 MG tablet TAKE 1 TABLET BY MOUTH DAILY    glucose blood test strip Use daily. Dispense test strips covered by insurance.     No current facility-administered medications for this visit.       ALLERGIES.  Review of Patient's Allergies indicates:   Penicillins  Comment:Took skin 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 & was positive, never             actually took it    FAMILY HISTORY.  Review of patient's family history indicates:  Problem: Cancer - Other      Relation: Mother          Age of Onset: (Not Specified)          Comment: stomach cancer, died  Problem: Heart Disease      Relation: Father          Age of Onset: (Not Specified)          Comment: died age 73 from MI; learned about HTN in 29's  Problem: GI      Relation: Sister          Age of Onset: (Not Specified)          Comment: hernia  Problem: Thyroid      Relation: Sister          Age of Onset: (Not Specified)      SOCIAL HISTORY.  Social History    Tobacco Use      Smoking status: Former        Packs/day: 0.33        Years: 10.00        Pack years: 3.3        Types: Cigarettes        Quit date: 10/15/2012        Years since quitting: 9.1       Smokeless tobacco: Never      Tobacco comments: quit 3 months ago on 10/15/12    Alcohol use: Yes      Comment: minimal    Drug use: No      REVIEW OF SYSTEMS.   14 points review of system was conducted. Positive as described in the above history of present illness. One other systems reviewed were negative.    __________________________________________________  PHYSICAL EXAMINATIONS:  BP (!) 162/98 (Site: RA, Position: Sitting)   Pulse 63   Temp 97.5 F (36.4 C) (Temporal)   Resp 18   Wt 82.1 kg (181 lb)   LMP 06/27/2019 (Approximate)   SpO2 98%   BMI 32.58 kg/m   GENERAL: Not in acute distress. Well developed and well-nourished.   EYES: Examination of the eyes demonstrated full extraocular movements. Pupils are equal, round, reactive to light and accommodation. There was no lid lag or proptosis. Negative for visual field defect in the office confrontation test.   ENT: Clear with moist mucous membranes without hyperpigmentation or lesions.   NECK: Negative for goiter. No cervical lymphadenopathy was appreciated. Negative for dorsalcervical fat deposition. Negative for acanthosis nigricans and skin tags.   CARDIOVASCULAR: Regular rate and rhythm, normal S1 and S2. Negative for murmur/rubs/gallop  CHEST: Clear to auscultation and percussion. Negative for respiratory distress/rales. Negative for chest tenderness.     GASTROINTESTINAL: Positive bowel sound, soft, nontender. Negative for hepatomegaly.    MUSCULOSKELETAL: Without clubbing, cyanosis or edema. Muscle strength was 5/5 bilateral upper and lower extremities.   SKIN: cool and dry to touch without obvious lesions. Negative for purple striae.   NEUROLOGICAL: Intact with no tremor of the outstretched fingers.  PSYCHIATRIC: Normal affect and responsiveness to questions.       LABORATORIES. -Reviewed.    Component      Latest Ref Rng & Units 08/07/2021 08/10/2021   PTH INTACT CALCIUM      8.5 - 10.5  mg/dL 12.1 (HH)    PTH INTACT      15 - 65 pg/mL 38     VITAMIN D,25 HYDROXY      30.0 - 100.0 ng/mL 53    THYROID SCREEN TSH REFLEX FT4      0.270 - 4.200 uIU/mL  1.950   ALBUMIN      3.4 - 5.2 g/dL  4.5       RADIOLOGY.     11/2015    Adrenal: The right adrenal gland is normal. There is a small nodule in     the left adrenal incompletely characterized in the contrast study.       ASSESSMENT    (I10) Essential hypertension, benign  She was upset when she came to the clinic and   Has had elevated blood pressure since, asymptomatic otherwise, she did take her blood pressure medication today, I suggest that she increased amlodipine to 10 mg per day, she will need to monitor blood pressure at home and follow up with primary care.    (E21.0) Primary hyperparathyroidism ([place])    Diagnoses is clear and the lesion has been localized to the right side,  she fulfilled the requirement to be considered for parathyroidectomy given her significantly elevated urine calcium.  She agrees to the plan and I have refer her to Dr. Orlin Hilding to consider parathyroidectomy, she will need to come back to follow-up with endocrine within 1 week after the surgery.  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   Plan: METANEPHRINES FRACTION PLASMA,         ALDOSTERONE/RENIN ACTIVITY, BASIC METABOLIC         PANEL, ALBUMIN, CORTISOL AM POST DEXAMETHASONE,        DEXAMETHASONE          PLANS:    1.  Continue with Vit D supplementation, referral to Dr. Orlin Hilding to consider parathyroidectomy, once she know the surgical day, she should let us know so we can arrange for follow up visit one week after parathyroidectomy.  2. CT of the adrenal pending  3. Lab test for   Metanephrine, aldosterone, renin, and 1 mg  dexamethasone suppression test.  4.    Bone density scan pending.    Follow-up visit in 3 months    Due to the language barrier, this office visit was conducted with the assistance of a telephone [place] interpreter.   All questions answered and concerns addressed.    I spent a total of 40 minutes on this visit on the date of service (total time includes all activities performed on the date of service)      This encounter note was created using a voice recognition software. Please excuse any typographical error that have not yet been reviewed and corrected.      Jackelin Correia H Sonda Coppens, MD  ENDOCRINOLOGY, DIABETES, & METABOLISM

## 2021-12-11 ENCOUNTER — Other Ambulatory Visit: Payer: Self-pay

## 2021-12-11 ENCOUNTER — Ambulatory Visit
Admission: RE | Admit: 2021-12-11 | Discharge: 2021-12-11 | Disposition: A | Discharge status: Home / Self Care | Payer: No Typology Code available for payment source | Attending: Internal Medicine | Admitting: Internal Medicine

## 2021-12-11 DIAGNOSIS — E278 Other specified disorders of adrenal gland: Secondary | ICD-10-CM | POA: Insufficient documentation

## 2021-12-11 LAB — CORTISOL AM POST DEXAMETHASONE: CORTISOL AM POST DEXAMETHASONE: 1.62 ug/dL (ref 0.00–5.00)

## 2021-12-15 ENCOUNTER — Encounter (HOSPITAL_BASED_OUTPATIENT_CLINIC_OR_DEPARTMENT_OTHER): Payer: Self-pay | Admitting: Internal Medicine

## 2021-12-15 LAB — ALDOSTERONE/RENIN ACTIVITY
ALDOSTERONE/RENIN RATIO: 6.3 (ref 0.0–30.0)
ALDOSTERONE: 4.4 ng/dL (ref 0.0–30.0)
RENIN PLASMA ACTIVITY: 0.699 ng/mL/hr (ref 0.167–5.380)

## 2021-12-15 LAB — METANEPHRINES FRACTION PLASMA
METANEPHRINE,PL: 23.6 pg/mL (ref 0.0–88.0)
NORMETANEPHRINE, PL: 128.7 pg/mL (ref 0.0–244.0)

## 2021-12-18 LAB — DEXAMETHASONE: DEXAMETHASONE: 498 ng/dL

## 2021-12-22 ENCOUNTER — Ambulatory Visit
Admission: RE | Admit: 2021-12-22 | Discharge: 2021-12-22 | Disposition: A | Discharge status: Home / Self Care | Payer: No Typology Code available for payment source | Attending: Student in an Organized Health Care Education/Training Program | Admitting: Student in an Organized Health Care Education/Training Program

## 2021-12-22 ENCOUNTER — Encounter (HOSPITAL_BASED_OUTPATIENT_CLINIC_OR_DEPARTMENT_OTHER): Payer: Self-pay | Admitting: Internal Medicine

## 2021-12-22 ENCOUNTER — Other Ambulatory Visit: Payer: Self-pay

## 2021-12-22 DIAGNOSIS — E278 Other specified disorders of adrenal gland: Secondary | ICD-10-CM

## 2021-12-22 DIAGNOSIS — D3501 Benign neoplasm of right adrenal gland: Secondary | ICD-10-CM | POA: Insufficient documentation

## 2021-12-22 DIAGNOSIS — N200 Calculus of kidney: Secondary | ICD-10-CM | POA: Insufficient documentation

## 2021-12-22 MED ORDER — NORMAL SALINE FLUSH 0.9 % IV SOLN
10.00 mL | Freq: Once | INTRAVENOUS | Status: AC
Start: 2021-12-22 — End: 2021-12-22
  Administered 2021-12-22: 50 mL via INTRAVENOUS

## 2021-12-22 MED ORDER — IOHEXOL 350 MG/ML IV SOLN
45.00 mL | Freq: Once | INTRAVENOUS | Status: AC
Start: 2021-12-22 — End: 2021-12-22
  Administered 2021-12-22: 65 mL via INTRAVENOUS

## 2021-12-26 ENCOUNTER — Ambulatory Visit (HOSPITAL_BASED_OUTPATIENT_CLINIC_OR_DEPARTMENT_OTHER): Payer: No Typology Code available for payment source | Admitting: Internal Medicine

## 2022-01-01 ENCOUNTER — Other Ambulatory Visit: Payer: Self-pay

## 2022-01-01 ENCOUNTER — Ambulatory Visit
Payer: No Typology Code available for payment source | Attending: Physical Medicine & Rehabilitation | Admitting: Physical Medicine & Rehabilitation

## 2022-01-01 VITALS — Ht 62.6 in | Wt 182.4 lb

## 2022-01-01 DIAGNOSIS — R200 Anesthesia of skin: Secondary | ICD-10-CM | POA: Insufficient documentation

## 2022-01-01 DIAGNOSIS — R202 Paresthesia of skin: Secondary | ICD-10-CM | POA: Diagnosis present

## 2022-01-01 NOTE — Progress Notes (Signed)
[facility]  [place], [place] [address]  Ph: [phone]  Fax: [phone]    ELECTROMYOGRAPHY REPORT          Full Name: Amanda Cervantes Gender: Female  MRN: [id number] Date of Birth: 1966-01-08      Visit Date: 01/01/2022 12:09  Age: 56 Years  Examining Physician: Henri Medal, M.D.  Referring Physician: Lerry Paterson, M.D.   Height: 5 feet 2 inch  Patient History: 64F with bilateral hand pain, numbness, tingling       Sensory NCS      Nerve / Sites Rec. Site Distance Lat. Peak Lat B-P Amp Segments Velocity Temp.     cm ms ms V  m/s C   L Median - Dig II (Antidromic)      Wrist Index 14 4.19 4.9 12.4 Wrist - Index 33 21.8      Palm Index 7    Palm - Index  21.8   L Ulnar - Dig V (Antidromic)      Wrist Dig V 14 2.83 3.4 19.4 Wrist - Dig V 49 21.8   R Median - Dig II (Antidromic)      Wrist Index 14 NR NR NR Wrist - Index NR 34      Palm Index 7    Palm - Index  33.9   R Ulnar - Dig V (Antidromic)      Wrist Dig V 14 3.02 3.5 8.1 Wrist - Dig V 46 34       Motor NCS      Nerve / Sites Muscle Distance Latency Amplitude Segments Lat Diff Velocity Temp. Area Dur.     cm ms mV  ms m/s C mVms ms   L Median - APB      Wrist APB 8 5.7 1.4 Wrist - APB   21.8 6.3 7.27      Elbow APB 18 9.3 2.0 Elbow - Wrist 3.6 50 21.8 8.1 7.58      A.Elbow APB  2.0 6.4 A.Elbow - Elbow -7.3  21.8 27.3 7.06   L Ulnar - ADM      Wrist ADM 8 2.9 8.6 Wrist - ADM   21.8 30.3 5.92      B.Elbow ADM 17.5 6.1 8.1 B.Elbow - Wrist 3.2 55 21.8 25.6 6.17   R Median - APB      Wrist APB 8 9.0 0.7 Wrist - APB   33.6 3.5 8.46      Elbow APB 17 13.4 1.6 Elbow - Wrist 4.4 39 33.8 7.6 8.10      A.Elbow APB  2.0 7.0 A.Elbow - Elbow -11.4  33.8 26.6 6.88   R Ulnar - ADM      Wrist ADM 8 3.1 8.0 Wrist - ADM   33 26.3 5.46      B.Elbow ADM 18 6.7 7.9 B.Elbow - Wrist 3.5 51 33 26.8 5.79      A.Elbow ADM 10 8.6 7.6 A.Elbow - B.Elbow 1.9 52 33.1 25.5 5.83       EMG Summary Table     Spontaneous MUAP Recruitment   Muscle IA Fib PSW Fasc  Comments Amp Dur. PPP Pattern   R. First dorsal interosseous NL None None None ------- 2+ NL NL Mod Red   R. Abductor pollicis brevis NL None None None ------- 1+ NL NL Mild Red   R. Flexor carpi radialis NL None None None ------- NL NL NL NL   R. Flexor carpi  ulnaris NL None None None ------- NL NL NL NL   R. Extensor digitorum communis NL None None None ------- NL NL NL NL   L. Abductor pollicis brevis NL None None None ------- 1+ NL NL Mild Red   L. First dorsal interosseous NL None None None ------- NL NL NL NL         SUMMARY  Sensory study:    R median nerve response could not be obtained.  L median nerve displayed prolonged peak latency and normal amplitude.  Bilateral ulnar nerves displayed normal peak latencies with reduced amplitude on the right.      Motor study:   b/l median nerves displayed prolonged distal latencies R>L, with reduced amplitudes R>L and incremental responses with palmar stimulation.  Bilateral ulnar nerve displayed normal amplitudes, distal latencies and CV, without a drop in CV across the R elbow.      EMG:   Bilateral APB mm showed increased amplitude MUAPs with mildly reduced recruitment.   R FDI muscle displayed moderately increased amplitude MUAPs with reduced recruitment.       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  There is evidence suggestive of ulnar neuropathy on the right; the site of involvement could not be localized. Presence of right C8 radiculopathy cannot be excluded, please correlate clinically.      ------------------------  Henri Medal, M.D.                      The report for this visit can be viewed as an attachment to this encounter or through Chart Review under the Cardio/Pulm/Neuro Tab as an EMG Report.

## 2022-01-02 ENCOUNTER — Other Ambulatory Visit (HOSPITAL_BASED_OUTPATIENT_CLINIC_OR_DEPARTMENT_OTHER): Payer: No Typology Code available for payment source

## 2022-01-04 ENCOUNTER — Telehealth (HOSPITAL_BASED_OUTPATIENT_CLINIC_OR_DEPARTMENT_OTHER): Payer: Self-pay

## 2022-01-04 NOTE — Progress Notes (Signed)
Called pt to confirm procedure on  01/26/22.  Pt has ride home arranged and understands prep with no questions.  Prep and DM instructions sent  via mail.     Instrues para colonoscopia  Preparao Padro    7 DIAS ANTES DO EXAME         NO TOMAR: medicamentos anti-inflamatrios no esteroides, como Ibuprofeno, Motrin, Advil, Naproxeno, Aleve, Naprosyn, Meloxicam, entre muitos outros  Voc pode tomar Tylenol (que  acetaminofeno) para dor  Alguns estados clnicos exigem que se utilize aspirina. No interrompa seu uso antes de falar com o seu profissional de sade.   3 DIAS ANTES DO EXAME       Interrompa o consumo de alimentos ricos em fibras, como milho, feijo, sementes, nozes, pes integrais ou cascas de frutas (pera, [place] etc.)    2  DIAS ANTES DO EXAME  Faa uma refeio leve antes das 19 horas    1 DIA ANTES DO EXAME  Inicie um dieta de lquidos claros (lquidos translcidos). Nenhum alimento slido ao longo do dia    So lquidos claros No so lquidos claros   gua, Gatorade, Powerade ou Pedialyte Nada vermelho ou roxo   Caf ou ch preto (sem leite ou creme) Nada alcolico   Sopas claras Nada de leite, creme ou outros produtos lcteos   Schweppes ou Sprite Nada de [place], arroz ou vegetais nas sopas   Suco de [place] Nada de suco com polpa   Gelatina, picols  Nenhum lquido que no seja translcido     Prepare o laxante: misture o p laxante  gua e coloque na geladeira   partir das 18h, beba um copo (240 ml) de laxante a cada 30 minutos, at [employer] do frasco. Permanea perto de um banheiro quando comear a tomar a mistura.    s 21h, tome 2 comprimidos contra gases ([place]) com 1 copo (240 ml) de lquido claro  s 22 horas, tome mais 2 comprimidos contra gases (Simeticona) com 1 copo (240 ml) de lquido claro    NO DIA DA COLONOSCOPIA  De 4 a 5 horas antes do exame, beba a outra metade do frasco de laxante.  muito importante terminar todo o frasco. Dessa maneira, voc conseguir esvaziar  o clon para fazer o exame sem problemas.  Na manh do exame, voc pode tomar seus outros medicamentos nos horrios de costume, com apenas um gole d'gua. Esses medicamentos incluem comprimidos para presso arterial, anticonvulsivos, remdios para o corao, a tireoide etc.  No tome comprimidos para diabetes. Caso tenha diabetes, siga as instrues de preparao para colonoscopia contidas no folheto para diabticos  No beba nada nas 3 horas anteriores ao exame

## 2022-01-05 ENCOUNTER — Ambulatory Visit (HOSPITAL_BASED_OUTPATIENT_CLINIC_OR_DEPARTMENT_OTHER): Payer: No Typology Code available for payment source | Admitting: Specialist

## 2022-01-15 ENCOUNTER — Ambulatory Visit: Payer: No Typology Code available for payment source | Attending: Internal Medicine | Admitting: Pharmacist

## 2022-01-15 ENCOUNTER — Other Ambulatory Visit: Payer: Self-pay

## 2022-01-15 VITALS — BP 138/90 | HR 74

## 2022-01-15 DIAGNOSIS — I10 Essential (primary) hypertension: Secondary | ICD-10-CM | POA: Insufficient documentation

## 2022-01-15 DIAGNOSIS — E785 Hyperlipidemia, unspecified: Secondary | ICD-10-CM | POA: Diagnosis present

## 2022-01-15 DIAGNOSIS — E1169 Type 2 diabetes mellitus with other specified complication: Secondary | ICD-10-CM | POA: Diagnosis present

## 2022-01-15 DIAGNOSIS — E119 Type 2 diabetes mellitus without complications: Secondary | ICD-10-CM | POA: Diagnosis present

## 2022-01-15 LAB — MICROALBUMIN RANDOM URINE
ALBUMIN URINE RANDOM: <1.2 mg/dL (ref 0.0–1.9)
CREATININE RANDOM URINE: 29 mg/dL (ref 28–217)

## 2022-01-15 LAB — LIPID PANEL
Cholesterol: 116 mg/dL (ref 0–239)
HIGH DENSITY LIPOPROTEIN: 43 mg/dL (ref 40–60)
LOW DENSITY LIPOPROTEIN DIRECT: 69 mg/dL (ref 0–189)
TRIGLYCERIDES: 109 mg/dL (ref 0–150)

## 2022-01-15 LAB — POC A1C: POC HEMOGLOBIN A1C: 6.3 % — ABNORMAL HIGH (ref 4–5.6)

## 2022-01-15 NOTE — Progress Notes (Signed)
Pharmacotherapy Diabetes Management Visit      Translator used for this visit: Yes, [place].    Amanda Cervantes is a 56 year old female with type 2 diabetes who presents to pharmacotherapy for follow-up diabetes management.       Medication reconciliation was completed at the beginning of the visit .  Patient reports (+) adherence to all medications including:    Diabetes Regimen:  Metformin XR 1500 mg a day  Patient unable to tolerate higher doses  Trulicity 1.5 mg weekly        Hypertension Regimen:  Amlodipine 10 mg daily  Lisinopril 40 mg daily      Past HTN medications tried (obtained from chart review):   Chlorthalidone - d/c's d/t hypercalcemia      Lipid Regimen:  Atorvastatin 10 mg daily        S/sx of hypoglycemia: denies    S/sx of hyperglycemia: denies    Other symptoms/complaints: bilateral leg pain for the past 2 months - patient reports right is worse than left.          SMBG: Tests occasionally (fasting or PP). Patient unable to recall readings, but reports last FBG was 88.       BP Self-monitoring:  Tests occasionally and reports readings are good. . Monitor was last validated on: Never      Diet: Did not discuss due to time constraints.        Exercise: Did not discuss due to time constraints.        Most Recent BP Reading(s)  01/15/22 : 138/90  12/08/21 : (!) 174/106  10/03/21 : 142/84  09/14/21 : (!) 147/91        Most Recent Pulse Reading(s)  01/15/22 : 74  12/08/21 : 63  10/03/21 : 96  09/14/21 : 93  08/07/21 : 88  10/20/20 : 91          Cholesterol (mg/dL)   Date Value   10/20/2020 129   04/21/2018 144   03/25/2017 186     LOW DENSITY LIPOPROTEIN DIRECT (mg/dL)   Date Value   10/20/2020 74   10/05/2019 91   04/21/2018 90     HIGH DENSITY LIPOPROTEIN (mg/dL)   Date Value   10/20/2020 34 (L)   04/21/2018 37 (L)   03/25/2017 41     TRIGLYCERIDES (mg/dL)   Date Value   10/20/2020 212 (H)   04/21/2018 164 (H)   03/25/2017 156 (H)         HEMOGLOBIN A1C (%)   Date Value   05/30/2020 8.3 (*H)    10/05/2019 12.4 (*H)   09/04/2018 9.2 (*H)       POC HEMOGLOBIN A1C (%)   Date Value   07/10/2021 6.1 (H)   01/16/2021 6.4 (H)   10/17/2020 6.5 (H)           Assessment/Recommendations:  Diabetes  Today's A1c was at goal (< 8%) at 6.3 %. Next due: 6 months   Last urine albumin/SCr ratio: negative for albuminuria.  ACE-I/ARB? Yes. lisinopril.    Interventions:   Continue metformin XR 1500 mg daily  Continue Trulicity 1.'5mg'$  weekly  SMBG: Encouraged patient to check 3 times a week as an aide to lifestyle modifications   Advised patient to call clinic prior to next appt for med adjustments should they experience readings <70 or >300      Hypertension  Blood pressure was above goal (<130/80 per 2023 ADA guidelines).  Interventions: Discussed intensifying  her regimen, however the patient believes her elevated BP is due to "white coat HTN". Therefore, we agreed to continue amlodipine 10 mg daily, lisinopril 40 mg daily, and return in 1 month for reassessment and home BP monitoring validation.       Lipids  LDL goal per 2023 ADA guidelines is <70 mg/dl for patients aged 40-75 years at an increased cardiovascular risk; <55 mg/dl for patients aged 10-75 with established cardiovascular disease  Interventions: continue atorvastatin 10 mg daily until we discuss today's lipid panel results          Education:  Importance of medication and visit compliance  A1c/SMBG goals  Blood pressure goals  Complications of uncontrolled DM, hypertension, and hyperlipidemia  Diet and exercise as a means of controlling BG, BP, and lipids        Will discuss lab results on 01/16/22 or sooner if clinically indicated.         Planned follow-up date: 02/12/22       Instructed patient to call clinic or go to the ER if they are experiencing unusual symptoms.    Patient verbalized understanding of everything discussed and agrees with plan. Patient was given the opportunity to ask questions/voice concerns.    Majority of this visit was spent  counseling the patient regarding medication management and chronic disease education.    Pharmacotherapy tracking for: Diabetes, Hyperlipidemia, and Hypertension/Elevated BP

## 2022-01-16 ENCOUNTER — Ambulatory Visit: Payer: No Typology Code available for payment source | Attending: Internal Medicine | Admitting: Pharmacist

## 2022-01-16 ENCOUNTER — Ambulatory Visit (HOSPITAL_BASED_OUTPATIENT_CLINIC_OR_DEPARTMENT_OTHER): Payer: No Typology Code available for payment source | Admitting: Specialist

## 2022-01-16 DIAGNOSIS — E785 Hyperlipidemia, unspecified: Secondary | ICD-10-CM | POA: Insufficient documentation

## 2022-01-16 DIAGNOSIS — E1169 Type 2 diabetes mellitus with other specified complication: Secondary | ICD-10-CM | POA: Diagnosis present

## 2022-01-16 NOTE — Progress Notes (Signed)
Pharmacotherapy Management Televisit - Lab Results       Translator used for this visit: No.    Amanda Cervantes is a 56 year old female who was seen by pharmacotherapy on 01/15/22 and today's call was scheduled to discuss lab results drawn during the visit.           Cholesterol (mg/dL)   Date Value   01/15/2022 116   10/20/2020 129   04/21/2018 144     LOW DENSITY LIPOPROTEIN DIRECT (mg/dL)   Date Value   01/15/2022 69   10/20/2020 74   10/05/2019 91     HIGH DENSITY LIPOPROTEIN (mg/dL)   Date Value   01/15/2022 43   10/20/2020 34 (L)   04/21/2018 37 (L)     TRIGLYCERIDES (mg/dL)   Date Value   01/15/2022 109   10/20/2020 212 (H)   04/21/2018 164 (H)           POC HEMOGLOBIN A1C (%)   Date Value   01/15/2022 6.3 (H)   07/10/2021 6.1 (H)   01/16/2021 6.4 (H)             Interventions:  Informed patient that all labs are within normal limits and advised patient to continue all medications as directed.      Planned follow-up date: 02/12/22     Encourage patient to [employer] school teacher prior to follow-up with any questions or concerns.        Patient verbalized understanding of everything discussed and agrees with plan. Patient was given the opportunity to ask questions/voice concerns.        Pharmacotherapy tracking for: Discharge and Hyperlipidemia

## 2022-01-24 ENCOUNTER — Ambulatory Visit: Payer: No Typology Code available for payment source | Attending: Orthopaedic Surgery | Admitting: Physician Assistant

## 2022-01-24 ENCOUNTER — Other Ambulatory Visit: Payer: Self-pay

## 2022-01-24 DIAGNOSIS — G5603 Carpal tunnel syndrome, bilateral upper limbs: Secondary | ICD-10-CM | POA: Diagnosis present

## 2022-01-24 DIAGNOSIS — R202 Paresthesia of skin: Secondary | ICD-10-CM | POA: Insufficient documentation

## 2022-01-24 DIAGNOSIS — G5602 Carpal tunnel syndrome, left upper limb: Secondary | ICD-10-CM | POA: Diagnosis present

## 2022-01-24 DIAGNOSIS — G5601 Carpal tunnel syndrome, right upper limb: Secondary | ICD-10-CM | POA: Insufficient documentation

## 2022-01-24 DIAGNOSIS — M25532 Pain in left wrist: Secondary | ICD-10-CM | POA: Insufficient documentation

## 2022-01-24 DIAGNOSIS — M25531 Pain in right wrist: Secondary | ICD-10-CM | POA: Diagnosis present

## 2022-01-24 DIAGNOSIS — R200 Anesthesia of skin: Secondary | ICD-10-CM | POA: Insufficient documentation

## 2022-01-24 NOTE — Progress Notes (Signed)
Orthopedic Office Note    CC: Patient presents with:  Follow Up: EMG  bil hand pain       ORTHOPEDIC PROBLEM LIST:  1.  Bilateral moderate to severe carpal tunnel syndrome, R>L  2. Bilateral hand numbness and tingling  3. Bilateral wrist pain    HPI: Amanda Cervantes is a 56 year old RHD, [place] speaking female who presents today for EMG review and reevaluation of chronic bilateral hand numbness and tingling.    To review: Patient began having symptoms around 1 year ago in November 2022.  Pain is localized at the level of the carpal tunnel; numbness and tingling encompasses all fingers except the small on both hands. Right significantly worse than the left. 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  At her last appointment her clinical exam was suspicious for carpal tunnel syndrome but could not rule out a component of Cushing's disease.  She was sent for an EMG and returns today.     Feels her symptoms have gotten slightly better at nighttime; with splint use she only wakes up once.  Was previously waking up 3 times.  However numbness has been persistent.  Right continues to be worse than left.  She has not developed pain that often will radiate through the right forearm and into the elbow.  Her most recent lab work was WNL.  Currently employed doing Public affairs consultant; does have some symptoms with planting flowers or using a shovel.  She is about to be in her off season starting in December.    A [place] interpreter was used for the duration of visit via telephone.    EMG: EMG performed by Dr. 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  Evidence for partial motor conduction block bilaterally, worse on the right.  Evidence suggestive of an ulnar neuropathy on the right though the site of involvement was not  localized.  Presence of right C8 radiculopathy cannot be excluded.  2+ right first dorsal interosseous needle changes; 1+ right abductor pollicis brevis needle change.  1+ left abductor pollicis brevis needle change.    EMG was reviewed by myself during the visit.    PHYSICAL EXAM:  GENERAL: Alert and oriented, in no acute distress  MOOD: Appropriate.   MUSCULOSKELETAL: Evaluation of bilateral hands shows no gross bony abnormalities. Mild thenar wasting, worse on the right than on the left. No additional overlying skin changes; skin is warm, dry, intact. NTTP throughout. Full ROM of the wrists b/l. Readily able to flex and extend all fingers with and without resistance without deficit. - Tinel's bilaterally. + Durkin's, Phalen's b/l.  Negative Tinel's at the cubital tunnel bilaterally.  Deep and superficial sensation intact and equal bilaterally.  2+ cap refill in all fingers, 2+ radial pulses bilaterally.    ASSESSMENT/PLAN: 56 year old female presenting today for EMG follow-up in regards to her bilateral hand numbness and tingling.  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  There also appears to be some degree of ulnar neuropathy in the right hand.  Discussed with the patient her options for treatment.  At this time would recommend bilateral carpal tunnel releases, starting with the right given the severity of her EMG  findings.  Discussed that most of her numbness should resolve in her right hand, but did discuss the findings of other neuropathies in that hand.  Patient vocalized understanding of this.  Discussed that any motor function lost from chronic compression of the nerve would not be able to be regained; discussed that surgery would help to prevent further loss.  Patient vocalized understanding of this.  Discussed that very likely her left hand will feel considerably better than her right.  She vocalized  understanding of this.  She would like to proceed with surgery.  This is her off season coming up; she would like to have surgery as soon as possible.  I will put in the case booking requests.  Our office will call the patient.  She should see [place] back around 1 to 2 weeks ahead of the surgery for preop H&P.  We will very likely start with the right and then 2 to 4 weeks later proceed with the left.  If she has questions or concerns in the interim she will give the office a call.    We reviewed the likely diagnosis, the prognosis, and various treatment options in detail. Risks and benefits of treatment plan discussed. The patient's questions have been answered, and the patient understands agrees with treatment plan.     Patient was evaluated independently by the PA.      This note was prepared using voice recognition software. Please disregard any transcription errors.    Belinda Fisher, PA-C, 01/24/2022      Nursing Communication:  Surgical booking (vitals, CHG )

## 2022-01-25 ENCOUNTER — Ambulatory Visit: Payer: No Typology Code available for payment source | Attending: Surgery | Admitting: Surgery

## 2022-01-25 ENCOUNTER — Encounter (HOSPITAL_BASED_OUTPATIENT_CLINIC_OR_DEPARTMENT_OTHER): Payer: Self-pay | Admitting: Surgery

## 2022-01-25 DIAGNOSIS — E278 Other specified disorders of adrenal gland: Secondary | ICD-10-CM | POA: Diagnosis present

## 2022-01-25 DIAGNOSIS — E210 Primary hyperparathyroidism: Secondary | ICD-10-CM | POA: Diagnosis present

## 2022-01-25 DIAGNOSIS — E1110 Type 2 diabetes mellitus with ketoacidosis without coma: Secondary | ICD-10-CM | POA: Insufficient documentation

## 2022-01-25 NOTE — Progress Notes (Signed)
56 yr old lady sent for consultation re hypercalcemia  Interpreter services used    Around [place], she had repeated nausea. Had lab work at the time which showed hypercalcemia.  The nausea has since resolved  She has been evaluated by Dr He, endocrinology.     On reviewing the records, I note that the hypercalcemia dates back to 2020.     She denies any GI complaints at present, no kidney stones or infections.  She does have bilateral carpal tunnel syndrome and is awaiting surgery for it in January.  She also c/o leg pain/cramps    She was supposed to have bone density studies, but did not get them.  She stopped taking  the calcium pills after she saw Dr He    Reports weight loss this past year - since being on Trulicity    Longstanding HTN and DM    No family h/o Parathyroid dz    PMH  Past Medical History:  06/22/2017: Astigmatism of both eyes with presbyopia  No date: HTN (hypertension)      PSH  Past Surgical History:  No date: LAPAROSCOPY FULGURATION OVIDUCTS  No date: OB ANTEPARTUM CARE CESAREAN DLVR & POSTPARTUM  No date: TUBAL LIGATION      MEDS  Current Outpatient Medications:   amLODIPine (NORVASC) 10 MG tablet, Take 1 tablet by mouth in the morning., Disp: 90 tablet, Rfl: 2  D3-1000 25 MCG (1000 UT) tablet, TAKE 2 TABLETS BY MOUTH DAILY, Disp: 180 tablet, Rfl: 3  atorvastatin (LIPITOR) 10 MG tablet, Take 1 tablet by mouth nightly, Disp: 90 tablet, Rfl: 3  TRULICITY 1.5 VE/9.3YB injection, INJECT 1.5 MG UNDER THE SKIN ONCE A WEEK, Disp: 2 mL, Rfl: 10  metFORMIN (GLUCOPHAGE-XR) 750 MG 24 hr tablet, TAKE 2 TABLETS BY MOUTH NIGHTLY, Disp: 180 tablet, Rfl: 3  lisinopril (ZESTRIL) 40 MG tablet, TAKE 1 TABLET BY MOUTH DAILY, Disp: 90 tablet, Rfl: 3    ALLERGIES  Review of Patient's Allergies indicates:   Penicillins             Other (See Comments)    Comment:Took skin 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 & was positive, never             actually took it    [facility]  Social History    Socioeconomic History      Marital  status: Legally Separated      Spouse name: Not on file      Number of children: Not on file      Years of education: Not on file      Highest education level: Not on file    Occupational History      Not on file    Tobacco Use      Smoking status: Former        Packs/day: 0.33        Years: 10.00        Pack years: 3.3        Types: Cigarettes        Quit date: 10/15/2012        Years since quitting: 9.2      Smokeless tobacco: Never      Tobacco comments: quit 3 months ago on 10/15/12    Substance and Sexual Activity      Alcohol use: Yes        Comment: minimal      Drug use: No      Sexual activity: Yes  Partners: Male        Birth control/protection: Tubal Ligation    Other Topics      Concerns:        Not on file    Social History Narrative      she is originally from [place] ,[place].  She came to the [place] in 2005       In [place], she worked as a Musician.       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      She lives with boyfriend & daughter; no DV;        she has 2 children, age 12 y/o son in [place] & 50 y/o daughter lives with her    Social Determinants of Librarian, academic Strain: Not on file  Food Insecurity: Not on file  Transportation Needs: Not on file  Physical Activity: Not on file  Stress: Not on file  Social Connections: Not on file  Intimate Partner Violence: Not on file  Housing Stability: Not on file    FH  Review of patient's family history indicates:  Problem: Cancer - Other      Relation: Mother          Age of Onset: (Not Specified)          Comment: stomach cancer, died  Problem: Heart Disease      Relation: Father          Age of Onset: (Not Specified)          Comment: died age 28 from MI; learned about HTN in 10's  Problem: GI      Relation: Sister          Age of Onset: (Not Specified)          Comment: hernia  Problem: Thyroid      Relation: Sister          Age of Onset: (Not Specified)              Review of Systems    Constitutional:  Negative for fatigue.   HENT: Negative.     Eyes: Negative.    Respiratory: Negative.     Cardiovascular: Negative.    Gastrointestinal:  Negative for abdominal distention, abdominal pain, constipation, nausea and vomiting.   Endocrine: Negative.    Genitourinary: Negative.    Musculoskeletal:         Leg pain   Skin: Negative.    Allergic/Immunologic: Negative.    Neurological: Negative.    Hematological: Negative.    Psychiatric/Behavioral: Negative.       Physical Exam  Constitutional:       General: She is not in acute distress.     Appearance: Normal appearance. She is not ill-appearing.   HENT:      Head: Normocephalic.      Nose: Nose normal.   Eyes:      General: No scleral icterus.  Neck:      Trachea: Trachea and phonation normal.      Comments: No neck mass palpable  Cardiovascular:      Rate and Rhythm: Normal rate.   Musculoskeletal:         General: Normal range of motion.      Cervical back: Normal range of motion and neck supple. No rigidity or tenderness.   Lymphadenopathy:      Cervical: No cervical adenopathy.   Skin:     General: Skin is  warm.   Neurological:      Mental Status: She is alert and oriented to person, place, and time.   Psychiatric:         Behavior: Behavior normal.           LABS     Component      Latest Ref Rng & Units 08/07/2021 08/10/2021   PTH INTACT CALCIUM      8.5 - 10.5 mg/dL 12.1 (HH)     PTH INTACT      15 - 65 pg/mL 38     VITAMIN D,25 HYDROXY      30.0 - 100.0 ng/mL 53     THYROID SCREEN TSH REFLEX FT4      0.270 - 4.200 uIU/mL   1.950   ALBUMIN      3.4 - 5.2 g/dL   4.5          Latest Reference Range & Units 08/04/09 08:50 10/13/11 09:42 07/11/15 20:51 11/21/15 13:46 03/25/17 14:36 04/21/18 11:42 05/22/18 15:14 10/05/19 14:49 12/10/19 08:36 10/20/20 09:44 10/03/21 13:52 12/08/21 11:07   CALCIUM 8.5 - 10.5 mg/dL 9.0 9.8 9.9 9.3 10.1 10.7 (H) 10.7 (H) 10.6 (H) 9.9 11.5 (H) 11.2 (H) 11.2 (H)   (H): Data is abnormally high        [place]  11/30/21    FINDINGS:     Echotexture: Homogenous     Isthmic thickness:  0.2 cm  Isthmus nodule (s): 1      Nodule 1:  Size: 1.0 x 0.9 x 0.5 cm  Location: Right isthmus  Composition: Mixed cystic and solid (1)  Echogenicity: Hypoechoic (2)  Shape, taller than wide?: No (0)  Margins: Smooth (0)  Echogenic Foci: Macrocalcifications (1)   Vascular Flow: None      Right lobe:  1.6 cm x 3.7 cm x 1.4 cm   Right sided nodule (s): 2      Nodule 1:  Size: 0.7 x 0.5 x 0.5 cm  Location: Mid right lobe  Composition: Cystic or almost completely cystic (0)  Echogenicity: Anechoic (0)  Shape, taller than wide?: No (0)  Margins: Smooth (0)  Echogenic Foci: None (0)   Vascular Flow: None       Nodule 2:  Size: 1.3 x 1.2 x 0.6 cm  Location: Posterior to right lobe  Composition: Solid or almost completely solid (2)  Echogenicity: Hypoechoic (2)  Shape, taller than wide?: No (0)  Margins: Smooth (0)  Echogenic Foci: None (0)   Vascular Flow: None      Left lobe:  1.2 cm x 3.0 cm x 0.7 cm   Left sided nodule (s): 1      Nodule 1:  Size: 0.5 x 0.4 x 0.3 cm  Location: Inferior left lobe  Composition: Solid or almost completely solid (2)  Echogenicity: Hypoechoic (2)  Shape, taller than wide?: No (0)  Margins: Smooth (0)  Echogenic Foci: None (0)   Vascular Flow: None      Cervical lymph nodes: There are no enlarged lymph nodes lateral to either common carotid artery.     IMPRESSION:   There is a 1.3 cm hypoechoic lesion posterior to the right lobe of the thyroid which could be a parathyroid.      NM PARATHYROID SCAN 11/03/21    FINDINGS:  On early imaging there is physiologic distribution of the radiotracer, with activity noted in the thyroid, parotid and salivary glands, as well as the heart.  On the delayed  imaging, the thyroid gland shows expected washout. There is residual activity along the right-sided thyroid bed indicating an underlying parathyroid adenoma.     IMPRESSION:     Focal radiotracer uptake activity at 4 hours along the  right-sided thyroid region which may represent an underlying parathyroid adenoma.      Reviewed and Electronically Signed By: Janeece Fitting, MD  BONE DENSITY      TOTAL VOLUME 24 HOUR URINE 600 - 1600 ml/24HR 2225 High    CREATININE 24HR URINE MG% mg/dL 64   CREATININE 24HR URINE 0.6 - 1.8 g/24HR 1.4   CALCIUM 24HR URINE mg/dl mg/dL 19.0   CALCIUM 24HR URINE 42 - 353 mg/24HR 423 High        A/P  56 yr old lady with hypercalcemia, primary hyperparathyroidism  Abnormal gland likely near right lower thyroid    I have had a detailed discussion with the patient about the test results, the implications. We discussed the diagnosis, natural history and management options.     At the moment she is largely asymptomatic. Would be good to get the bone density studies done before finalizing plans for surgery. Bone density studies were ordered by Dr He, but patient was unaware. Our office will help schedule that test.     Dr He had also ordered CT adrenal and Labs to r/o Pheo/cushing's/primary aldosteronism. I will touch base with the endocrinologists for that f/u.    She is already scheduled for her hand surgery, the PT surgery will probably flow.     Will need med clearance prior to booking.The patient is on board with that.    All questions were answered. F/u arranged.

## 2022-01-26 ENCOUNTER — Encounter (HOSPITAL_BASED_OUTPATIENT_CLINIC_OR_DEPARTMENT_OTHER): Payer: Self-pay

## 2022-01-26 ENCOUNTER — Other Ambulatory Visit: Payer: Self-pay

## 2022-01-26 ENCOUNTER — Ambulatory Visit
Admission: RE | Admit: 2022-01-26 | Discharge: 2022-01-26 | Disposition: A | Discharge status: Home / Self Care | Payer: No Typology Code available for payment source | Attending: Internal Medicine | Admitting: Internal Medicine

## 2022-01-26 ENCOUNTER — Ambulatory Visit (HOSPITAL_BASED_OUTPATIENT_CLINIC_OR_DEPARTMENT_OTHER): Payer: Self-pay

## 2022-01-26 DIAGNOSIS — Z7984 Long term (current) use of oral hypoglycemic drugs: Secondary | ICD-10-CM | POA: Insufficient documentation

## 2022-01-26 DIAGNOSIS — Z87891 Personal history of nicotine dependence: Secondary | ICD-10-CM | POA: Diagnosis not present

## 2022-01-26 DIAGNOSIS — Z86010 Personal history of colonic polyps: Secondary | ICD-10-CM | POA: Diagnosis not present

## 2022-01-26 DIAGNOSIS — E119 Type 2 diabetes mellitus without complications: Secondary | ICD-10-CM | POA: Diagnosis not present

## 2022-01-26 DIAGNOSIS — K5730 Diverticulosis of large intestine without perforation or abscess without bleeding: Secondary | ICD-10-CM | POA: Diagnosis not present

## 2022-01-26 DIAGNOSIS — Z7985 Long-term (current) use of injectable non-insulin antidiabetic drugs: Secondary | ICD-10-CM | POA: Insufficient documentation

## 2022-01-26 DIAGNOSIS — Z1211 Encounter for screening for malignant neoplasm of colon: Secondary | ICD-10-CM | POA: Diagnosis not present

## 2022-01-26 DIAGNOSIS — K644 Residual hemorrhoidal skin tags: Secondary | ICD-10-CM | POA: Diagnosis not present

## 2022-01-26 DIAGNOSIS — I10 Essential (primary) hypertension: Secondary | ICD-10-CM | POA: Diagnosis not present

## 2022-01-26 DIAGNOSIS — D123 Benign neoplasm of transverse colon: Secondary | ICD-10-CM | POA: Insufficient documentation

## 2022-01-26 MED ORDER — LACTATED RINGERS IV BOLUS
INTRAVENOUS | Status: DC | PRN
Start: 2022-01-26 — End: 2022-01-26

## 2022-01-26 MED ORDER — LIDOCAINE HCL (PF) 2 % IJ SOLN
INTRAMUSCULAR | Status: AC
Start: 2022-01-26 — End: 2022-01-26
  Filled 2022-01-26: qty 5

## 2022-01-26 MED ORDER — LIDOCAINE HCL (PF) 2 % IJ SOLN
Freq: Once | INTRAMUSCULAR | Status: DC | PRN
Start: 2022-01-26 — End: 2022-01-26
  Administered 2022-01-26: 100 mg via INTRAVENOUS

## 2022-01-26 MED ORDER — PROPOFOL INFUSION
INTRAVENOUS | Status: AC
Start: 2022-01-26 — End: 2022-01-26
  Filled 2022-01-26: qty 50

## 2022-01-26 MED ORDER — PROPOFOL 200 MG/20 ML IV - AN
Freq: Once | INTRAVENOUS | Status: DC | PRN
Start: 2022-01-26 — End: 2022-01-26
  Administered 2022-01-26: 80 mg via INTRAVENOUS
  Administered 2022-01-26: 100 ug/kg/min via INTRAVENOUS

## 2022-01-26 MED ORDER — PROPOFOL 200 MG/20ML IV EMUL
INTRAVENOUS | Status: AC
Start: 2022-01-26 — End: 2022-01-26
  Filled 2022-01-26: qty 20

## 2022-01-26 NOTE — H&P (Signed)
GI Pre-procedure History and Physical Short Form  Amanda Cervantes is an56 year old female.    Chief Complaint: She is being scheduled for Colonoscopy    The history is provided by the patient. A language interpreter was used.           Active Problems:  Patient Active Problem List:     Obesity (BMI 30-39.9)     Essential hypertension, benign     Tobacco dependence     Type 2 diabetes mellitus (HCC)     Left knee pain     Left leg pain     Headache disorder     Diverticulitis of large intestine without perforation or abscess without bleeding     History of colonic polyps     Plantar plate injury, left, initial encounter     Metatarsal deformity, left     Rash     Vitamin D deficiency     Hyperlipidemia associated with type 2 diabetes mellitus (HCC)     Astigmatism of both eyes with presbyopia     Primary hyperparathyroidism ([place])     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     Hypercalcemia     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[facility])     Right carpal tunnel syndrome     Left carpal tunnel syndrome      History (Medical, Surgical, Social, Family):  Past Medical History:  06/22/2017: Astigmatism of both eyes with presbyopia  No date: HTN (hypertension)  Past Surgical History:  No date: LAPAROSCOPY FULGURATION OVIDUCTS  No date: OB ANTEPARTUM CARE CESAREAN DLVR & POSTPARTUM  No date: TUBAL LIGATION  Social History     Socioeconomic History    Marital status: Legally Separated     Spouse name: Not on file    Number of children: Not on file    Years of education: Not on file    Highest education level: Not on file   Occupational History    Not on file   Tobacco Use    Smoking status: Former     Packs/day: 0.33     Years: 10.00     Pack years: 3.30     Types: Cigarettes     Quit date: 10/15/2012     Years since quitting: 9.2    Smokeless tobacco: Never    Tobacco comments:     quit 3 months ago on 10/15/12   Substance and Sexual Activity    Alcohol use: Yes     Comment: minimal    Drug use: No    Sexual activity: Yes      Partners: Male     Birth control/protection: Tubal Ligation   Other Topics Concern    Not on file   Social History Narrative    she is originally from [place] ,[place].  She came to the [place] in 2005     In [place], she worked as a Musician.     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    She lives with boyfriend & daughter; no DV;      she has 2 children, age 28 y/o son in [place] & 1 y/o daughter lives with her   Social Determinants of Librarian, academic Strain: Not on file  Food Insecurity: Not on file  Transportation Needs: Not on file  Physical Activity: Not on file  Stress: Not on file  Social Connections: Not on file  Intimate Partner Violence:  Not on file  Housing Stability: Not on file  Review of patient's family history indicates:  Problem: Cancer - Other      Relation: Mother          Age of Onset: (Not Specified)          Comment: stomach cancer, died  Problem: Heart Disease      Relation: Father          Age of Onset: (Not Specified)          Comment: died age 43 from MI; learned about HTN in 30's  Problem: GI      Relation: Sister          Age of Onset: (Not Specified)          Comment: hernia  Problem: Thyroid      Relation: Sister          Age of Onset: (Not Specified)      Allergies:   Review of Patient's Allergies indicates:   Penicillins             Other (See Comments)    Comment:Took skin 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 & was positive, never             actually took it    Medications:   amLODIPine (NORVASC) 10 MG tablet, Take 1 tablet by mouth in the morning., Disp: 90 tablet, Rfl: 2  D3-1000 25 MCG (1000 UT) tablet, TAKE 2 TABLETS BY MOUTH DAILY, Disp: 180 tablet, Rfl: 3  atorvastatin (LIPITOR) 10 MG tablet, Take 1 tablet by mouth nightly, Disp: 90 tablet, Rfl: 3  TRULICITY 1.5 ZO/1.0RU injection, INJECT 1.5 MG UNDER THE SKIN ONCE A WEEK, Disp: 2 mL, Rfl: 10  metFORMIN (GLUCOPHAGE-XR) 750 MG 24 hr tablet, TAKE 2 TABLETS BY MOUTH NIGHTLY, Disp: 180 tablet, Rfl:  3  lisinopril (ZESTRIL) 40 MG tablet, TAKE 1 TABLET BY MOUTH DAILY, Disp: 90 tablet, Rfl: 3    No current facility-administered medications for this encounter.      Vitals:  BP 130/85   Pulse 89   Temp 99.4 F (37.4 C) (Temporal)   Resp 16   Ht 5' 2.6" (1.59 m)   Wt 80.3 kg (177 lb)   LMP 06/27/2019 (Approximate)   SpO2 98%   BMI 31.76 kg/m     Review of Systems   Respiratory: Negative.     Cardiovascular:         Hypertension   Endocrine:        Diabetes   Neurological: Negative.        Physical Exam  Cardiovascular:      Rate and Rhythm: Normal rate and regular rhythm.      Pulses: Normal pulses.      Heart sounds: Normal heart sounds.   Pulmonary:      Effort: Pulmonary effort is normal.      Breath sounds: Normal breath sounds.             Airway Evaluation:  Gag reflex intact: Yes  Ability to open mouth wide:   Full  Dentures:  No  Loose teeth:  No  Neck range of motion  Full    Mallampati AirwayClassification: Class II   The same as Class I except the tonsilar pillars are hidden by the tongue.  Mallampati Airway Classification:     ASA Classification: ASA Class II (a patient with mild systemic disease)    Assessment:    Proceed with procedure

## 2022-01-26 NOTE — Anesthesia Postprocedure Evaluation (Signed)
Anesthesia Post-Operative Evaluation Note    Patient: Amanda Cervantes           Procedure Summary       Date: 01/26/22 Room / Location: [facility] - Gastroenterology    Anesthesia Start: 1109 Anesthesia Stop: 1132    Procedure: COLONOSCOPY Diagnosis: Type 2 diabetes mellitus without complication, without long-term current use of insulin ([place])    Scheduled Providers: Junius Argyle, MD; Uvaldo Rising, MD Responsible Provider: Uvaldo Rising, MD    Anesthesia Type: MAC ASA Status: 3              POST-OPERATIVE EVALUATION    Anesthesia Post Evaluation    Vitals signs in patient's normal range: Yes  Respiratory function stable; airway patent: Yes  Cardiovascular function stable: Yes  Hydration status stable: Yes  Mental status recovered; patient participates in evaluation and/or is at baseline: Yes  Pain control satisfactory: Yes  Nausea and vomiting control satisfactory: YesProcedure was labor & delivery no  PostOP disposition PACU  Anesthesia Observation no significant observation    MIPS#404 Anesthesiology Smoking Abstinence:     The patient is a current smoker (e.g. cigarette, cigar, pipe, e-cigarette/vaping/marijuana) (H8299): Yes   The patient underwent an elective surgery or procedure requiring anesthesia (B7169) : Yes   The patient received preop smoking cessation instructions prior to the day of surgery or procedure by MD, APC or RN proxy staff [phone]): No     The patient smoked the day of the procedure (Y1017): No  FOR CODING USE ONLY: IF BLANK P1025    MIPS#477 Multimodal Pain Management:  Emergent - Exclusion case: No  Patient was administered multimodal pain management (two or more drugs and/or interventions excluding systemic opioids) in the perioperative period; occurring at some time between 6 hours prior to anesthesia start time until discharged from: No   List medical reason(s) patient did not receive multimodal pain management (E5277): no pain anticipated postop  FOR  CODING USE ONLY: REASON NOT LISTED O2423    NTIR#443 Perioperative Temperature Management:  Anesthesia start to Anesthesia end time was 60 minutes or longer (4256F): No   FOR CODING USE ONLY: REASON NOT LISTED X5400    MIPS#430 Adult Prevention of PONV:  Patient received an inhalational anesthetic (QQ761): No  FOR CODING USE ONLY: REASON NOT LISTED P5093    MIPS#463 Pediatric Prevention of PONV:  Pediatric patient?: No  FOR CODING USE ONLY: REASON NOT LISTED O6712        [employer] # [id number]        Last vitals    BP: 130/85 (01/26/2022  9:48 AM)  Temp: 99.4 F (37.4 C) (01/26/2022  9:48 AM)  Pulse: 89 (01/26/2022  9:48 AM)  Resp: 16 (01/26/2022  9:48 AM)  SpO2: 98 % (01/26/2022  9:48 AM)

## 2022-01-26 NOTE — Anesthesia Preprocedure Evaluation (Signed)
Pre-Anesthetic Note  .      Patient: Amanda Cervantes is a 56 year old female      Procedure Information       Date/Time: 01/26/22 1010    Scheduled providers: Junius Argyle, MD; Uvaldo Rising, MD    Procedure: COLONOSCOPY    Diagnosis: Type 2 diabetes mellitus without complication, without long-term current use of insulin ([place]) [E11.9]    Location: [facility] - Gastroenterology            Relevant Problems   No relevant active problems           Previous Anesthetic History:   Past Surgical History:  No date: LAPAROSCOPY FULGURATION OVIDUCTS  No date: OB ANTEPARTUM CARE CESAREAN DLVR & POSTPARTUM  No date: TUBAL LIGATION        Medications  Current Outpatient Medications   Medication Sig   . amLODIPine (NORVASC) 10 MG tablet Take 1 tablet by mouth in the morning.   . D3-1000 25 MCG (1000 UT) tablet TAKE 2 TABLETS BY MOUTH DAILY   . atorvastatin (LIPITOR) 10 MG tablet Take 1 tablet by mouth nightly   . TRULICITY 1.5 UJ/8.1XB injection INJECT 1.5 MG UNDER THE SKIN ONCE A WEEK   . metFORMIN (GLUCOPHAGE-XR) 750 MG 24 hr tablet TAKE 2 TABLETS BY MOUTH NIGHTLY   . lisinopril (ZESTRIL) 40 MG tablet TAKE 1 TABLET BY MOUTH DAILY     No current facility-administered medications for this encounter.         Allergies:   Review of Patient's Allergies indicates:   Penicillins             Other (See Comments)    Comment:Took skin 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 & was positive, never             actually took it    Smoking, Alcohol, Drugs:  Social History    Tobacco Use      Smoking status: Former        Packs/day: 0.33        Years: 10.00        Pack years: 3.3        Types: Cigarettes        Quit date: 10/15/2012        Years since quitting: 9.2      Smokeless tobacco: Never      Tobacco comments: quit 3 months ago on 10/15/12    Alcohol use: Yes      Comment: minimal      Drug use:   No       PMHx:  Past Medical History:  06/22/2017: Astigmatism of both eyes with presbyopia  No date: HTN (hypertension)    Vitals  Ht  5' 2.6" (1.59 m)   Wt 80.3 kg (177 lb)   LMP 06/27/2019 (Approximate)   BMI 31.76 kg/m     Review of Systems     Patient summary reviewed and Nursing notes reviewed      Anesthetic History:   negative anesthesia history ROS           Cardiovascular:  Positive for hypercholesterolemia and hypertension.   Physical Activity in METs greater than 4    Pulmonary: Negative. negative for Pulmonary DiseasePositive for tobacco use (in past quit 2014, 2-3 cigarettes a day, smoked today).   GU/Renal:  Positive for other renal disease (left adrenal mass).   Neurological: Negative for neurological diseases.  Positive for mononeuropathy. mononeuropathy  Gastrointestinal: Negative  for gastrointestinal diseases.    Hematological: Negative for hematological diseases.    Endocrine: Negative for endocrine diseases/disorders.    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   Musculoskeletal:  Positive for arthralgias.   Psychiatric: Negative for psychiatry diseases.     Constitutional: Negative for constitutional diseases.    Skin: Negative for skin diseases.      Physical Exam    General     Level of consciousness:  Alert and oriented (time, person, place)   BMI   BMI greater than 30 kg/m2   Airway     Mallampati:  II    TM distance:  >3 FB    Mouth opening:  >3 FB    Neck ROM:  Full   Teeth  - normal exam  }   Heart      Lungs                Pertinent Labs:   Lab Results   Component Value Date    NA 140 12/08/2021    K 4.2 12/08/2021    CREAT 0.8 12/08/2021    GLUCOSER 89 12/08/2021    WBC 10.5 11/21/2015    HCT 36.4 11/21/2015    PLTA 174 11/21/2015         Anesthesia Plan    ASA Score:     ASA:  3    Airway:      Mallampati:  II    Mouth opening:  >3 FB    Neck ROM:  Full    TM distance:  >3 FB     Plan: MAC    Other information:     EKG Reviewed: : Yes      Full Stomach Precaution:: No      Post-Plan::  PACU    Anesthesia Assessment and Plan:        56 yo female with HTN, HLD, tobacco use who presents for  colonoscopy under MAC propofol sedation v GA.  R/B/A with all questions answered.     Informed Consent:     Anesthetic plan and risks discussed with:  Patient   Patient Consented        Attending Anesthesiologist Statement:     Reassessed day of surgery: Yes        Assessment made, necessary equipment and appropriate plan in place.

## 2022-01-26 NOTE — PROVATION-GI (Addendum)
[facility]  Patient Name: Amanda Cervantes  MRN: [id number]  CSN: [id number]  Date of Birth: 09/18/1965  Admit Type: Outpatient  Age: 56  Gender: Female  Note Status: Finalized  Patient Location: [facility]  Referring MD:          Gavin Pound MD, MD  Procedure Date:        01/26/2022 10:24:46 AM  Procedure:             Colonoscopy  Endoscopist:           Junius Argyle MD, MD  Indications for Procedure:       High risk colon cancer surveillance: Personal history of colonic polyps  Medications:           Monitored Anesthesia Care  Procedure:       Just prior to the procedure, an updated history and physical was done. I        obtained an informed consent from the patient reviewing the risk of the        procedure including (but not limited to) respiratory depression, perforation,        bleeding, discomfort, a possible need for surgery and unexpected reactions to        medications. The patient is aware that test has limitations and may not        detect significant lesions such as cancer or other potential diseases. The        patient was also informed that they might need a repeat colonoscopy earlier        than standard guidelines if there are changes in their symptoms or concerning        findings noted. A time out was performed with the entire procedure staff        present. The scope was passed under direct vision. Throughout the procedure,        the patient's blood pressure, pulse, and oxygen saturations were monitored        continuously. The [id number] was introduced through the anus and        advanced to the cecum, identified by appendiceal orifice and ileocecal valve.        The scope was then slowly withdrawn with confirmation of the noted findings.        The colonoscopy was performed without difficulty. The patient tolerated the        procedure well. The quality of the bowel preparation was fair. The total        duration of the procedure was 17 minutes. Scope withdrawal time was 5  minutes.  Findings:       Skin tags were found on perianal exam.       A diminutive polyp was found in the hepatic flexure. The polyp was removed        with a jumbo cold forceps. Resection and retrieval were complete.       Diverticula were found in the sigmoid colon and ascending colon.  Scope Withdrawal Time: 0 hours 5 minutes 13 seconds   Post Procedure Diagnosis:       - Preparation of the colon was fair.       - Perianal skin tags found on perianal exam.       - One diminutive polyp at the hepatic flexure, removed with a jumbo cold        forceps. Resected and retrieved.       - Diverticulosis in the sigmoid colon and in the ascending  colon.  Complications:         No immediate complications.  Estimated Blood Loss:  Estimated blood loss: none.  Recommendation:       - Await pathology results.       - Repeat colonoscopy in 5 years for surveillance.  Junius Argyle MD, MD  01/26/2022 11:34:32 AM  This report has been signed electronically.  Number of Addenda: 0  Note Initiated On: 01/26/2022 10:24 AM

## 2022-01-29 ENCOUNTER — Ambulatory Visit: Payer: No Typology Code available for payment source | Attending: Internal Medicine | Admitting: Internal Medicine

## 2022-01-29 ENCOUNTER — Other Ambulatory Visit: Payer: Self-pay

## 2022-01-29 ENCOUNTER — Encounter (HOSPITAL_BASED_OUTPATIENT_CLINIC_OR_DEPARTMENT_OTHER): Payer: Self-pay | Admitting: Internal Medicine

## 2022-01-29 ENCOUNTER — Telehealth (HOSPITAL_BASED_OUTPATIENT_CLINIC_OR_DEPARTMENT_OTHER): Payer: Self-pay | Admitting: Internal Medicine

## 2022-01-29 VITALS — BP 148/92 | HR 95 | Temp 97.9°F | Wt 179.0 lb

## 2022-01-29 DIAGNOSIS — M79671 Pain in right foot: Secondary | ICD-10-CM | POA: Diagnosis present

## 2022-01-29 DIAGNOSIS — G8929 Other chronic pain: Secondary | ICD-10-CM | POA: Insufficient documentation

## 2022-01-29 DIAGNOSIS — M25561 Pain in right knee: Secondary | ICD-10-CM | POA: Insufficient documentation

## 2022-01-29 DIAGNOSIS — E210 Primary hyperparathyroidism: Secondary | ICD-10-CM | POA: Diagnosis not present

## 2022-01-29 DIAGNOSIS — I10 Essential (primary) hypertension: Secondary | ICD-10-CM | POA: Diagnosis not present

## 2022-01-29 DIAGNOSIS — E119 Type 2 diabetes mellitus without complications: Secondary | ICD-10-CM | POA: Diagnosis present

## 2022-01-29 DIAGNOSIS — M25562 Pain in left knee: Secondary | ICD-10-CM | POA: Insufficient documentation

## 2022-01-29 LAB — VITAMIN D,25 HYDROXY: VITAMIN D,25 HYDROXY: 45 ng/mL (ref 30.0–100.0)

## 2022-01-29 MED ORDER — METOPROLOL SUCCINATE ER 25 MG PO TB24
25.0000 mg | ORAL_TABLET | Freq: Every day | ORAL | 11 refills | Status: DC
Start: 2022-01-29 — End: 2022-02-20

## 2022-01-29 NOTE — Progress Notes (Signed)
CC: Diabetes follow-up & leg pain, foot pain & HTN    Leg pain  Mainly around her knees bilaterally  Worse when going up or down stairs or working long days  Not a problem when she's sleeping at night  Last vit D level normal -continue to take her supplements  No falls    Hyperparathyroidism - she is following up with her bone mineral density as well as consult with Dr. Orlin Hilding, plan to have surgery soon    Foot pain  Notes that the back of her right heel hurts - worse with prolonged standing    FOLLOW-UP DIABETES  See EPIC Problem List for HPI.    Her last HbA1c was     HEMOGLOBIN A1C (%)   Date Value   05/30/2020 8.3 (*H)   10/05/2019 12.4 (*H)   09/04/2018 9.2 (*H)         HEMOGLOBIN A1C (%)   Date Value   05/30/2020 8.3 (*H)   10/05/2019 12.4 (*H)   09/04/2018 9.2 (*H)       POC HEMOGLOBIN A1C (%)   Date Value   01/15/2022 6.3 (H)   07/10/2021 6.1 (H)   01/16/2021 6.4 (H)              Her Most Recent Weight Reading(s)  01/29/22 : 81.2 kg (179 lb)  01/26/22 : 80.3 kg (177 lb)  01/01/22 : 82.7 kg (182 lb 6.4 oz)  12/08/21 : 82.1 kg (181 lb)  10/03/21 : 82.6 kg (182 lb)    She does not report polyuria, polydipsia, blurry vision, chest pain, dyspnea or claudication.  No foot burning, numbness or pain.       She does not report feeling lightheaded or tremulous.    Please see EPIC R[facility] field for last visit to ophtho & podiatry.    Her last microalbumin was:   ALB/CREAT RATIO URINE RAN (ug/mg)   Date Value   01/15/2022 TNP     last potassium was   POTASSIUM (mmol/L)   Date Value   12/08/2021 4.2       last creatinine was   CREATININE (mg/dL)   Date Value   12/08/2021 0.8     last   ESTIMATED GLOMERULAR FILT RATE (ML/MIN)   Date Value   12/08/2021 > 60       She reports taking medicines as below   amLODIPine (NORVASC) 10 MG tablet, Take 1 tablet by mouth in the morning., Disp: 90 tablet, Rfl: 2  D3-1000 25 MCG (1000 UT) tablet, TAKE 2 TABLETS BY MOUTH DAILY, Disp: 180 tablet, Rfl: 3  atorvastatin (LIPITOR) 10 MG tablet,  Take 1 tablet by mouth nightly, Disp: 90 tablet, Rfl: 3  TRULICITY 1.5 KG/4.0NU injection, INJECT 1.5 MG UNDER THE SKIN ONCE A WEEK, Disp: 2 mL, Rfl: 10  metFORMIN (GLUCOPHAGE-XR) 750 MG 24 hr tablet, TAKE 2 TABLETS BY MOUTH NIGHTLY, Disp: 180 tablet, Rfl: 3  lisinopril (ZESTRIL) 40 MG tablet, TAKE 1 TABLET BY MOUTH DAILY, Disp: 90 tablet, Rfl: 3    No current facility-administered medications on file prior to visit.       She is prescribed metformin and the dose is not greater than the maximum dose recommended given her renal function as follows:   GFR >60, 2500 mg;   GFR 45-59, 2000 mg;   GFR 30-44, 1000 mg; reassess need for metformin on yearly basis  GFR <30, avoid   (based on recommendations in JAMA 2014;312:2668)  Her goal LDL is to be on a moderate dose statin given that she is >89 y/o or <40 y/o with multiple cardiovascular risk factors, her last LDL was   LOW DENSITY LIPOPROTEIN DIRECT (mg/dL)   Date Value   01/15/2022 69         Her goal BP is <130/80 based on ADA & ACC/AHH guidelines as long as this can be achieved safely, her last BP was Most Recent BP Reading(s)  01/29/22 : Marland Kitchen 148/92  01/26/22 : 112/73       Review of Patient's Allergies indicates:   Penicillins             Other (See Comments)    Comment: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  She came to the [place] in 2005       In [place], she worked as a Musician.       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      She lives with boyfriend & daughter; no DV;        she has 2 children, age 5 y/o son in [place] & 69 y/o daughter lives with her    BP (!) 148/92   Pulse 95   Temp 97.9 F (36.6 C) (Temporal)   Wt 81.2 kg (179 lb)   LMP 06/27/2019 (Approximate)   SpO2 98%   BMI 32.12 kg/m   Heart: S1 and S2 normal, no murmurs, clicks, gallops or rubs. Regular rate and rhythm.    Lungs:  clear; no wheezes, rhonchi or rales.  MMSE: well groomed, affect normal, no tangential thoughts, no pressured speech, no agitation or psychomotor slowing, normal response-time to questions  Knee exam: full range of motion, no pain on motion, no effusion, tenderness, masses, ligamentous instability or deformity noted.   Feet exam: no swelling, tenderness or skin or vascular lesions. Color and temperature is normal. Sensation is intact. Peripheral pulses are palpable. Toenails are normal.     ASSESSMENT & PLAN:  (I10) Essential hypertension, benign  (primary encounter diagnosis)  Comment: not well controlled, particularly as she's heading into surgery - will add beta-blocker to help with control - her K+ dipped down with chlorthalidone - so will not restart thiazide  Plan: IMMUNIZATION ADMIN SINGLE, HZV ZOSTER VACC         RECOMBINANT ADJUVANTED IM NJX, MODERNA >12YO         COVID-19 FALL 2023, REFERRAL TO ENT ([facility])        ,follow up RN BP visit in 1 month    (E11.9) Type 2 diabetes mellitus without complication, without long-term current use of insulin ([facility])  Comment: doing very well, continue with meds      (E21.0) Primary hyperparathyroidism ([place])  Comment: important to follow up with bone mineral density & Dr. 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 I explained the diagnosis and treatment plan, and the patient expressed understanding of the content. I attempted to answer any questions  regarding the diagnosis and the proposed treatment.    Possible side effects of the prescribed medication was explained. We discussed the patient's current medications.  We discussed the importance of medication compliance. The patient expressed understanding and no barriers to adherence were identified.    follow-up will be scheduled for 1 month from now for RN BP check  she has been advised to call or return with any worsening or new problems    30 minutes was spent on the day of the visit on some or all of the following activities:   Preparing to see the patient (eg, review of tests)   Obtaining and/or reviewing separately obtained history   Performing a medically appropriate examination and/or evaluation   Counseling and educating the patient/family/caregiver   Ordering medications, tests, or procedures   Referring and communicating with other health care professionals    Documenting clinical information in the electronic or other health record   Independently interpreting results and  communicating results to the patient/family/caregiver   Care coordination

## 2022-01-29 NOTE — Telephone Encounter (Signed)
benefit analysis for the shingrix Vaccine.  The vaccine is covered under the patient?s [employer] medical coverage.     Please choose Private

## 2022-01-30 ENCOUNTER — Encounter (HOSPITAL_BASED_OUTPATIENT_CLINIC_OR_DEPARTMENT_OTHER): Payer: Self-pay | Admitting: Surgery

## 2022-01-30 DIAGNOSIS — E210 Primary hyperparathyroidism: Secondary | ICD-10-CM

## 2022-01-30 LAB — SURGICAL PATH SPECIMEN GASTROINTESTINAL

## 2022-01-31 ENCOUNTER — Encounter (HOSPITAL_BASED_OUTPATIENT_CLINIC_OR_DEPARTMENT_OTHER): Payer: Self-pay | Admitting: Internal Medicine

## 2022-01-31 ENCOUNTER — Encounter (HOSPITAL_BASED_OUTPATIENT_CLINIC_OR_DEPARTMENT_OTHER): Payer: Self-pay | Admitting: Gastroenterology

## 2022-02-05 ENCOUNTER — Ambulatory Visit
Admission: RE | Admit: 2022-02-05 | Discharge: 2022-02-05 | Disposition: A | Discharge status: Home / Self Care | Payer: No Typology Code available for payment source | Attending: Radiology | Admitting: Radiology

## 2022-02-05 ENCOUNTER — Ambulatory Visit: Payer: No Typology Code available for payment source | Attending: Podiatrist | Admitting: Podiatrist

## 2022-02-05 ENCOUNTER — Encounter (HOSPITAL_BASED_OUTPATIENT_CLINIC_OR_DEPARTMENT_OTHER): Payer: Self-pay | Admitting: Podiatrist

## 2022-02-05 ENCOUNTER — Ambulatory Visit (HOSPITAL_BASED_OUTPATIENT_CLINIC_OR_DEPARTMENT_OTHER)
Admit: 2022-02-05 | Discharge: 2022-02-05 | Disposition: A | Discharge status: Home / Self Care | Payer: No Typology Code available for payment source

## 2022-02-05 ENCOUNTER — Other Ambulatory Visit: Payer: Self-pay

## 2022-02-05 DIAGNOSIS — G8929 Other chronic pain: Secondary | ICD-10-CM

## 2022-02-05 DIAGNOSIS — M170 Bilateral primary osteoarthritis of knee: Secondary | ICD-10-CM | POA: Insufficient documentation

## 2022-02-05 DIAGNOSIS — R600 Localized edema: Secondary | ICD-10-CM | POA: Insufficient documentation

## 2022-02-05 DIAGNOSIS — M7731 Calcaneal spur, right foot: Secondary | ICD-10-CM | POA: Insufficient documentation

## 2022-02-05 DIAGNOSIS — E119 Type 2 diabetes mellitus without complications: Secondary | ICD-10-CM | POA: Diagnosis present

## 2022-02-05 DIAGNOSIS — M9261 Juvenile osteochondrosis of tarsus, right ankle: Secondary | ICD-10-CM

## 2022-02-05 MED ORDER — NABUMETONE 750 MG PO TABS
750.0000 mg | ORAL_TABLET | Freq: Two times a day (BID) | ORAL | 0 refills | Status: DC
Start: 2022-02-05 — End: 2022-03-15

## 2022-02-05 NOTE — Progress Notes (Signed)
PODIATRIC MEDICINE AND SURGERY NOTE    CHIEF COMPLAINT    Patient presents with:  Referral: Right foot pain      HPI    Amanda Cervantes is a 56 year old female who presents with complaints of large bone prominence to the posterior aspect of the right heel.  This has been a chronic condition and has made daily walking and footwear challenging.  She denies any history of trauma.  She is known to me and I had previously repaired a plantar plate injury of her left forefoot which did heal uneventfully.  Patient with notable comorbidities of type 2 diabetes and primary hyperparathyroidism. Patient also has complaints of bilateral leg pain but I do believe this is mostly attributed to her knees for which she does states she will be starting physical therapy.  Marland Kitchen    REVIEW OF SYSTEMS    Otherwise negative except as per HPI, including:  General: Denies fever, chills, night sweats or unintended weight loss.      PAST MEDICAL HISTORY  Past Medical History:  06/22/2017: Astigmatism of both eyes with presbyopia  No date: HTN (hypertension)    SURGICAL HISTORY  Past Surgical History:  No date: LAPAROSCOPY FULGURATION OVIDUCTS  No date: OB ANTEPARTUM CARE CESAREAN DLVR & POSTPARTUM  No date: TUBAL LIGATION    CURRENT MEDICATIONS      Current Outpatient Medications:     nabumetone (RELAFEN) 750 MG tablet, Take 1 tablet by mouth in the morning and 1 tablet before bedtime. Do all this for 14 days., Disp: 28 tablet, Rfl: 0    metoprolol (TOPROL-XL) 25 MG 24 hr tablet, Take 1 tablet by mouth in the morning., Disp: 30 tablet, Rfl: 11    amLODIPine (NORVASC) 10 MG tablet, Take 1 tablet by mouth in the morning., Disp: 90 tablet, Rfl: 2    D3-1000 25 MCG (1000 UT) tablet, TAKE 2 TABLETS BY MOUTH DAILY, Disp: 180 tablet, Rfl: 3    atorvastatin (LIPITOR) 10 MG tablet, Take 1 tablet by mouth nightly, Disp: 90 tablet, Rfl: 3    TRULICITY 1.5 JA/2.5KN injection, INJECT 1.5 MG UNDER THE SKIN ONCE A WEEK, Disp: 2 mL, Rfl: 10    metFORMIN  (GLUCOPHAGE-XR) 750 MG 24 hr tablet, TAKE 2 TABLETS BY MOUTH NIGHTLY, Disp: 180 tablet, Rfl: 3    lisinopril (ZESTRIL) 40 MG tablet, TAKE 1 TABLET BY MOUTH DAILY, Disp: 90 tablet, Rfl: 3    ALLERGIES    Review of Patient's Allergies indicates:   Penicillins             Other (See Comments)    Comment:Took skin 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 & was positive, never             actually took it   Thiazide-type diure*    Other (See Comments)    Comment:Stopped due to low K+    SOCIAL HISTORY  Social History     Socioeconomic History    Marital status: Legally Separated     Spouse name: Not on file    Number of children: Not on file    Years of education: Not on file    Highest education level: Not on file   Occupational History    Not on file   Tobacco Use    Smoking status: Former     Packs/day: 0.33     Years: 10.00     Additional pack years: 0.00     Total pack years: 3.30  Types: Cigarettes     Quit date: 10/15/2012     Years since quitting: 9.3    Smokeless tobacco: Never    Tobacco comments:     quit 3 months ago on 10/15/12   Substance and Sexual Activity    Alcohol use: Yes     Comment: minimal    Drug use: No    Sexual activity: Yes     Partners: Male     Birth control/protection: Tubal Ligation   Other Topics Concern    Not on file   Social History Narrative    she is originally from [place] ,[place].  She came to the [place] in 2005     In [place], she worked as a Musician.     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    She lives with boyfriend & daughter; no DV;      she has 2 children, age 79 y/o son in [place] & 108 y/o daughter lives with her   Social Determinants of Librarian, academic Strain: Not on file  Food Insecurity: Not on file  Transportation Needs: Not on file  Physical Activity: Not on file  Stress: Not on file  Social Connections: Not on file  Intimate Partner Violence: Not on file  Housing Stability: Not on file    FAMILY HISTORY  Review of patient's family history  indicates:  Problem: Cancer - Other      Relation: Mother          Age of Onset: (Not Specified)          Comment: stomach cancer, died  Problem: Heart Disease      Relation: Father          Age of Onset: (Not Specified)          Comment: died age 87 from MI; learned about HTN in 64's  Problem: GI      Relation: Sister          Age of Onset: (Not Specified)          Comment: hernia  Problem: Thyroid      Relation: Sister          Age of Onset: (Not Specified)      PHYSICAL EXAM    Vital Signs: LMP 06/27/2019 (Approximate)   Presents afebrile and hemodynamically stabile  Constitutional:  Well-developed, Well-nourished, No acute distress, Non-toxic appearance.       FOCUSED LOWER EXTREMITY    Cardiovascular: Patient does exhibit a warm and well-perfused foot bilateral.  Distal pulses are well palpated.  Capillary filling time is immediate.  Skin temperature and gradient is within normal without asymmetry between the extremities.  There is no leg edema noted.  Musculoskeletal: Patient does ambulate in conventional footwear without assistance.  She has a large bone prominence to the posterior aspect of the right calcaneus around the insertion of the Achilles tendon.  It is painful with palpation.  The tendon itself is well palpated and continuous.  There is negative Thompson test.  Skin:  skin color, texture, turgor are normal  Lymphatic:  No lymphadenopathy noted.   Neurological: Gait normal. Reflexes normal and symmetric. Sensation grossly normal      RADIOLOGY  Radiographs of the right os calcis confirmed a large posterior calcaneal enthesophyte with degenerative Haglund deformity.    LABS  SODIUM   Date Value Ref Range Status   12/08/2021 140 136 - 145 mmol/L Final  POTASSIUM   Date Value Ref Range Status   12/08/2021 4.2 3.5 - 5.1 mmol/L Final     CHLORIDE   Date Value Ref Range Status   12/08/2021 107 98 - 107 mmol/L Final     CARBON DIOXIDE   Date Value Ref Range Status   12/08/2021 27 21 - 32 mmol/L Final      BUN (UREA NITROGEN)   Date Value Ref Range Status   12/08/2021 15 7 - 18 mg/dL Final     CREATININE   Date Value Ref Range Status   12/08/2021 0.8 0.4 - 1.2 mg/dL Final     Glucose Random   Date Value Ref Range Status   12/08/2021 89 74 - 160 mg/dL Final     CALCIUM   Date Value Ref Range Status   12/08/2021 11.2 (H) 8.5 - 10.5 mg/dL Final     PHOSPHORUS   Date Value Ref Range Status   10/03/2021 2.4 (L) 2.5 - 4.9 mg/dL Final     MAGNESIUM   Date Value Ref Range Status   07/11/2015 1.9 1.8 - 2.4 mg/dL Final     ANION GAP   Date Value Ref Range Status   12/08/2021 6 (L) 10 - 22 mmol/L Final     ESTIMATED GLOMERULAR FILT RATE   Date Value Ref Range Status   12/08/2021 > 60 >60 ML/MIN Final     Comment:     On December 10, 2019 the NKF-ASN Task Force recommended the  adoption of the new eGFR 2021 CKD EPI creatinine equation  that estimates kidney function without a race variable. NKF  and ASN recommend diagnosing kidney disease using a blood  test for creatinine to estimate GFR and a urine test for  albumin to calculate albumin to creatinine ratio (uACR). For  more information please see  https://www.kidney.org/news/nkf-and-asn-release-new-way-to-d  iagnose-kidney-diseases.         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  I did discuss prognosis and treatment alternatives.  I would like to initiate care by placing her on a short course of a prescribed nonsteroidal anti-inflammatory. 1. Consultation with Your Physician: You are receiving this prescription as part of your treatment plan after a thorough evaluation of your medical condition. It's crucial that you trust  and maintain open communication with your healthcare team. 2. Prescribed Dosage: Follow the prescribed dosage and administration instructions exactly as provided by your healthcare provider. Do not alter the dose without consulting [place], as it may affect your treatment's effectiveness or safety. 3. Potential Side Effects: Like all medications, this one carries the possibility of side effects. Familiarize yourself with the common side effects associated with this medication, and inform us promptly if you experience any unusual or severe reactions. 4. Drug Interactions: Some medications can interact with others, potentially leading to adverse effects. Please inform us of all medications, supplements, and herbal products you are currently taking to ensure there are no potential interactions. 5. Pre-existing Conditions: If you have other medical conditions or are taking medications for them, please inform us. We need  this information to ensure the prescribed medication is safe and appropriate for you. 6. Pregnancy and Nursing: If you are pregnant, planning to become pregnant, or nursing, please let us know. Some medications can pose risks during pregnancy or while breastfeeding, and we will work with you to make informed decisions. 7. Storage and Disposal: Store the medication as directed on the packaging and dispose of it properly when no longer needed. This helps prevent accidental ingestion or environmental harm. Follow local regulations for medication disposal. 8. Reporting Adverse Events: Should you experience unexpected or severe adverse effects while taking this medication, please contact us immediately. Your safety is our top priority, and we need to address any concerns promptly.     Patient is also being referred for formal physical therapy that she may do concomitantly with her knees.    (E11.9) Type 2 diabetes mellitus without complication, without long-term current use of insulin ([place])  Comment: Patient is a  known type II diabetic with recent A1c of 6.3% suggesting good control  Plan: Patient recommendations for daily foot care include visually examining both feet including the sole and between the toes.  Patient is to keep feet dry by regularly changing shoes and socks and to dry feet after baths or exercise.  They are to report any new lesions, discolorations or swelling to primary care provider.  Patient educated regarding shoes to include risks of walking barefooted even when indoors.  Recommended appropriate footwear that are well fitted and minimize any type of rubbing or feel too tight.  Suggest yearly replacement of shoes.  I did stress the importance of glycemic control.          FOLLOW-UP  4 weeks      A majority of this note has been dictated with a voice recognition system. Occasional wrong-word or "sound-A-like" substitutions may have occurred due to the inherent limitations of voice recognition software. Read the chart carefully and recognize, using context, where substitutions have occurred. Please excuse any identified errors in spelling or syntax. Every effort has been made to appropriately edit and correct upon completion.      Electronically signed by: Shelbie Proctor, DPM, 02/05/2022 12:20 PM

## 2022-02-06 ENCOUNTER — Telehealth (HOSPITAL_BASED_OUTPATIENT_CLINIC_OR_DEPARTMENT_OTHER): Payer: Self-pay

## 2022-02-06 ENCOUNTER — Encounter (HOSPITAL_BASED_OUTPATIENT_CLINIC_OR_DEPARTMENT_OTHER): Payer: Self-pay | Admitting: Internal Medicine

## 2022-02-06 DIAGNOSIS — M79671 Pain in right foot: Secondary | ICD-10-CM | POA: Insufficient documentation

## 2022-02-06 NOTE — Telephone Encounter (Signed)
First call attempt to patient with Interpreter [phone] on the line after conferring with PCP      This RN reviewed PCP note with patient and recommendation. Patient verbalized agreement and understanding.      Reports has PT referral on hand, will call PT facility of choice to make an appointment      Advised patient to call the office with any questions or concerns

## 2022-02-06 NOTE — Progress Notes (Signed)
Hi RN's-     Please call pt to let her know that I received her x-ray tests.    The xray shows evidence of normal wear-and-tear arthritis which is common with a lot of use - doesn't change our plans from yesterday - focus on PT call if not improving in 3-6 months.    Thanks, [employer]

## 2022-02-06 NOTE — Telephone Encounter (Signed)
-----   Message from Gavin Pound, MD sent at 02/06/2022  3:24 PM EST -----  Hi RN's-     Please call pt to let her know that I received her x-ray tests.    The xray shows evidence of normal wear-and-tear arthritis which is common with a lot of use - doesn't change our plans from yesterday - focus on PT call if not improving in 3-6 months.    Thanks, [employer]

## 2022-02-12 ENCOUNTER — Institutional Professional Consult (permissible substitution) (HOSPITAL_BASED_OUTPATIENT_CLINIC_OR_DEPARTMENT_OTHER): Payer: No Typology Code available for payment source | Admitting: Pharmacist

## 2022-02-20 ENCOUNTER — Ambulatory Visit: Payer: No Typology Code available for payment source | Attending: Otolaryngology | Admitting: Otolaryngology

## 2022-02-20 ENCOUNTER — Encounter (HOSPITAL_BASED_OUTPATIENT_CLINIC_OR_DEPARTMENT_OTHER): Payer: Self-pay | Admitting: Internal Medicine

## 2022-02-20 ENCOUNTER — Other Ambulatory Visit: Payer: Self-pay

## 2022-02-20 ENCOUNTER — Telehealth (HOSPITAL_BASED_OUTPATIENT_CLINIC_OR_DEPARTMENT_OTHER): Payer: Self-pay | Admitting: Internal Medicine

## 2022-02-20 ENCOUNTER — Ambulatory Visit
Payer: No Typology Code available for payment source | Attending: Audiologist-Hearing Aid Fitter | Admitting: Audiologist-Hearing Aid Fitter

## 2022-02-20 ENCOUNTER — Encounter (HOSPITAL_BASED_OUTPATIENT_CLINIC_OR_DEPARTMENT_OTHER): Payer: Self-pay | Admitting: Otolaryngology

## 2022-02-20 ENCOUNTER — Ambulatory Visit: Payer: No Typology Code available for payment source | Attending: Internal Medicine | Admitting: Internal Medicine

## 2022-02-20 VITALS — BP 143/89 | HR 76 | Temp 98.4°F | Wt 182.2 lb

## 2022-02-20 DIAGNOSIS — Z9109 Other allergy status, other than to drugs and biological substances: Secondary | ICD-10-CM | POA: Diagnosis present

## 2022-02-20 DIAGNOSIS — H9202 Otalgia, left ear: Secondary | ICD-10-CM | POA: Insufficient documentation

## 2022-02-20 DIAGNOSIS — I10 Essential (primary) hypertension: Secondary | ICD-10-CM | POA: Diagnosis present

## 2022-02-20 DIAGNOSIS — H93A1 Pulsatile tinnitus, right ear: Secondary | ICD-10-CM | POA: Diagnosis present

## 2022-02-20 MED ORDER — METOPROLOL SUCCINATE ER 50 MG PO TB24
50.00 mg | ORAL_TABLET | Freq: Every day | ORAL | 11 refills | Status: AC
Start: 2022-02-20 — End: 2023-02-20

## 2022-02-20 MED ORDER — FLUTICASONE PROPIONATE 50 MCG/ACT NA SUSP
1.0000 | Freq: Every day | NASAL | 1 refills | Status: DC
Start: 2022-02-20 — End: 2022-03-16

## 2022-02-20 MED ORDER — OFLOXACIN 0.3 % OT SOLN
3.0000 [drp] | Freq: Two times a day (BID) | OTIC | 0 refills | Status: DC
Start: 2022-02-20 — End: 2022-02-22

## 2022-02-20 NOTE — Progress Notes (Signed)
Referring Provider: Gavin Pound, MD      Subjective:   Patient: Amanda Cervantes [id number], DOB:1965/04/05   Visit date: 02/20/2022 2:52 PM     Chief Complaint:  No chief complaint on file.    HPI:  Amanda Cervantes is a 56 year old female who I was asked to see in consultation for left otalgia. Pt reports getting hit in the ear with branch inside her left ear causing otalgia about 45 days ago. Pt was noted to have blood in in her EAC by pcp. No drops were sent. Pt reports pain has improved and only occurs when pressure is applied to outside of ear.  Admits to intermittent throbbing sensation/ blocked ear sensation in the right ear. Reports random thumping that does not sound like her heart beat. Noise occurs every few days and and lasts second.     Otologic history:  Denies hearing loss  Reports tinnitus  Denies dizziness  Denies vertigo  Reports aural fullness  Denies history of ear surgery  Denies history of radiation to the head/neck  Denies history of chemotherapy  Reports history of trauma to the head/neck  Hx of environmental allergies. Works as a Development worker, international aid and increases allergy symptoms.     Review of Systems:  Fevers: No.  Chills: No.  Weight loss: No.  Change in hearing: No.  Tinnitus: Yes.  Vertigo: Yes.     The patient's meds, allergies, medical, surgical, social & family histories were reviewed & updated:  -     Patient Active Problem List:     Obesity (BMI 30-39.9)     Essential hypertension, benign     Tobacco dependence     Type 2 diabetes mellitus (HCC)     Left knee pain     Left leg pain     Headache disorder     Diverticulitis of large intestine without perforation or abscess without bleeding     History of colonic polyps     Plantar plate injury, left, initial encounter     Metatarsal deformity, left     Rash     Vitamin D deficiency     Hyperlipidemia associated with type 2 diabetes mellitus (HCC)     Astigmatism of both eyes with presbyopia     Primary hyperparathyroidism ([place])     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     Hypercalcemia     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(HCC)     Right carpal tunnel syndrome     Left carpal tunnel syndrome     Right foot pain    -     Past Surgical History:  No date: LAPAROSCOPY FULGURATION OVIDUCTS  No date: OB ANTEPARTUM CARE CESAREAN DLVR & POSTPARTUM  No date: TUBAL LIGATION  -       Current Outpatient Medications   Medication Instructions    amLODIPine (NORVASC) 10 mg, Oral, DAILY    atorvastatin (LIPITOR) 10 mg, Oral, NIGHTLY    D3-1000 25 MCG (1000 UT) tablet TAKE 2 TABLETS BY MOUTH DAILY    fluticasone (FLONASE) 50 MCG/ACT nasal spray 1 spray, Each Nostril, DAILY    lisinopril (ZESTRIL) 40 mg, Oral, DAILY    metFORMIN (GLUCOPHAGE-XR) 1,500 mg, Oral, NIGHTLY    metoprolol (TOPROL-XL) 25 mg, Oral, DAILY    nabumetone (RELAFEN) 750 mg, Oral, 2 TIMES DAILY    ofloxacin (FLOXIN) 0.3 % otic solution 3 drops, Left Ear, 2 TIMES DAILY    Trulicity 1.5 mg, Subcutaneous, WEEKLY     -  Review of Patient's Allergies indicates:   Penicillins             Other (See Comments)    Comment:Took skin 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 & was positive, never             actually took it   Thiazide-type diure*    Other (See Comments)    Comment:Stopped due to low K+    Pertinent family history:        Objective:   Physical Exam:  There were no vitals filed for this visit.    General: Well-developed, well-nourished in no acute distress.   Voice: Normal, communicates well  Face: No masses or lesions. Sensation intact bilaterally in the V1, V2, and V3 distributions. Facial movement intact bilaterally and symmetric.  Eyes: PERRLA and EOMI.  Ear R: No lesions. Cerumen: none / minimal. Canal clear. TM clear.  Ear L:  No lesions. Cerumen: none / minimal. Canal with small dried blood present.  TM clear.  Nose:  Septum midline.  Turbinates Hypertrophic:  right.  No polyps or masses.  Oral Cavity/Oropharynx: Tongue midline, no masses or lesions. Symmetric palate rise. Tonsils 1+. Remainder of oral cavity and  oropharynx without masses or lesions.  Neck: No palpable lymphadenopathy or masses. Trachea midline. Thyroid without obvious masses.       Audiogram:  Right ear: normal hearing. Type Ad tympanometry. WRS not performed due to language barrier  Left ear: normal hearing. Type A tympanometry. WRS not performed       Imaging:  Scans reviewed and interpreted independently by me today.      Data reviewed for today's visit:  External notes:  Test results:  Independent historian required: No.  Independent test interpretation performed: Yes.  Discussion with other HCP: Yes.       Assessment & Plan:   EMMERY SEILER is a 56 year old female presenting with     Diagnoses and all orders for this visit:    Otalgia of left ear    Environmental allergies    Pulsatile tinnitus, right ear    Other orders  -     ofloxacin (FLOXIN) 0.3 % otic solution; Place 3 drops into the left ear in the morning and 3 drops before bedtime. Do all this for 3 days.  -     fluticasone (FLONASE) 50 MCG/ACT nasal spray; 1 spray by Each Nostril route in the morning.    Otalgia of left ear likely from foreign body scraping EAC. Will use Ofloxacin drops bid x 3 days to prevent infection. Pain should improve with time.     Pt has intermittent pulsatile tinnitus of the left ear not correlated with pt's heartbeat. Audiogram WNL. Tinnitus is likely from slight ETD due to allergies or TMJD. Pt will start flonase daily for 1 month. If sx persist or worsen follow up    Follow up prn         Interpreter used today: Yes, Portuguese    Bill Salinas, PA-C  Department of Otolaryngology   02/20/22

## 2022-02-20 NOTE — Progress Notes (Signed)
History:  Amanda Cervantes is a 56 year old female who is referred for left otalgia and is seen for a hearing evaluation.    Impressions:  Tympanometry is consistent with a hypermobile TM in the right ear and a normal TM mobility and normal middle ear pressure in the left ear. Pure tone testing revealed a normal hearing bilaterally. Hearing is adequate for normal communication.    Recommendation:  Re-evaluation upon Physician's request.

## 2022-02-20 NOTE — Telephone Encounter (Signed)
pcv20    The vaccine is covered under the patient's [employer] medical coverage.    Please choose Private

## 2022-02-20 NOTE — Progress Notes (Signed)
HTN:  See EPIC Problem List for HPI.  She reports taking the hypertension medications as on her EPIC med list below.  She reports no difficulty with compliance or side-effects  She does not report chest pain, dyspnea, headache, lower extremity edema or localized weakness or paresthesias  Her last three blood pressures in clinic were:  Most Recent BP Reading(s)  02/20/22 : 143/89  01/29/22 : (!) 148/92  01/26/22 : 112/73    Her Most Recent Weight Reading(s)  02/20/22 : 82.6 kg (182 lb 3.2 oz)  01/29/22 : 81.2 kg (179 lb)  01/26/22 : 80.3 kg (177 lb)  01/01/22 : 82.7 kg (182 lb 6.4 oz)      last potassium was   POTASSIUM (mmol/L)   Date Value   12/08/2021 4.2     last creatinine was   CREATININE (mg/dL)   Date Value   12/08/2021 0.8            ofloxacin (FLOXIN) 0.3 % otic solution, Place 3 drops into the left ear in the morning and 3 drops before bedtime. Do all this for 3 days., Disp: 1 each, Rfl: 0  fluticasone (FLONASE) 50 MCG/ACT nasal spray, 1 spray by Each Nostril route in the morning., Disp: 1 each, Rfl: 1  nabumetone (RELAFEN) 750 MG tablet, Take 1 tablet by mouth in the morning and 1 tablet before bedtime. Do all this for 14 days., Disp: 28 tablet, Rfl: 0  metoprolol (TOPROL-XL) 25 MG 24 hr tablet, Take 1 tablet by mouth in the morning., Disp: 30 tablet, Rfl: 11  amLODIPine (NORVASC) 10 MG tablet, Take 1 tablet by mouth in the morning., Disp: 90 tablet, Rfl: 2  D3-1000 25 MCG (1000 UT) tablet, TAKE 2 TABLETS BY MOUTH DAILY, Disp: 180 tablet, Rfl: 3  atorvastatin (LIPITOR) 10 MG tablet, Take 1 tablet by mouth nightly, Disp: 90 tablet, Rfl: 3  TRULICITY 1.5 ZO/1.0RU injection, INJECT 1.5 MG UNDER THE SKIN ONCE A WEEK, Disp: 2 mL, Rfl: 10  metFORMIN (GLUCOPHAGE-XR) 750 MG 24 hr tablet, TAKE 2 TABLETS BY MOUTH NIGHTLY, Disp: 180 tablet, Rfl: 3  lisinopril (ZESTRIL) 40 MG tablet, TAKE 1 TABLET BY MOUTH DAILY, Disp: 90 tablet, Rfl: 3    No current facility-administered medications on file prior to visit.        BP 143/89 (Site: LA, Position: Sitting, Cuff Size: Lrg)   Pulse 76   Temp 98.4 F (36.9 C) (Temporal)   Wt 82.6 kg (182 lb 3.2 oz)   LMP 06/27/2019 (Approximate)   SpO2 100%   BMI 32.69 kg/m    Heart: S1 and S2 normal, no murmurs, clicks, gallops or rubs. Regular rate and rhythm.   Lungs:  clear; no wheezes, rhonchi or rales.    ASSESSMENT & PLAN:  (I10) Essential hypertension, benign  (primary encounter diagnosis)  Comment: BP better but not at goal yet, she still has some room to move with the beta-blocker dose - so will increase metoprolol from 25 mg to 50 mg daily   As she has surgery on 03/15/22, will schedule another RN BP check before then  Plan: IMMUNIZATION ADMIN SINGLE, IIV4 VACC PRESERV         FREE AGE 60 MONTHS AND OLDER, 0.5ML, IM, PCV20         VACCINE (PNEUMOCOCCAL/PREVNAR 20)              The patient was ready to learn and no apparent learning or adherence barriers were identified. I explained the diagnosis  and treatment plan, and the patient expressed understanding of the content. I attempted to answer any questions regarding the diagnosis and the proposed treatment.    Possible side effects of the prescribed medication was explained. We discussed the patient's current medications.  We discussed the importance of medication compliance. The patient expressed understanding and no barriers to adherence were identified.    follow-up will be scheduled for 2 weeks from now  she has been advised to call or return with any worsening or new problems

## 2022-02-21 ENCOUNTER — Other Ambulatory Visit: Payer: Self-pay

## 2022-02-21 ENCOUNTER — Ambulatory Visit
Admission: RE | Admit: 2022-02-21 | Discharge: 2022-02-21 | Disposition: A | Discharge status: Home / Self Care | Payer: No Typology Code available for payment source

## 2022-02-21 ENCOUNTER — Encounter (HOSPITAL_BASED_OUTPATIENT_CLINIC_OR_DEPARTMENT_OTHER): Payer: Self-pay

## 2022-02-21 NOTE — Surgery Pre-Op (Signed)
PAT Visit          Amanda Cervantes is a 56 year old female received a preoperative screening.    For both carpal tunnel  surgery on  Thursday 03/15/22 and Parathyroidectomy scheduled on 03/23/21                                                                 (Friday)    Appointments for Next 30 Days 02/21/2022 - 03/23/2022        Date Visit Type Department Provider     02/26/2022  1:30 PM ORTHO 15 F/U [place], [place]    Patient Instructions:                  02/27/2022  3:30 PM XR DXA [facility] [place]    Patient Instructions:     If procedure was booked less than 2 years prior, may not be covered by insurance unless checking progress, changing meds, or new diagnosis.                 03/05/2022  9:30 AM OFF 15 F/U [facility] Rogelia Mire, [place]    Patient Instructions:                  03/07/2022  4:00 PM [place]: LONG    Patient Instructions:                  03/08/2022 10:00 AM OFF 15 F/U [facility] Corena Pilgrim, MD    Patient Instructions:                  03/13/2022  3:00 PM PRE-ADMISSION [facility] [place]    Patient Instructions:     On this date, you will receive a phone call from a [facility] nurse to discuss your upcoming procedure. You do not need to come to the hospital for this appointment                      Surgery Information       Future Procedures (Tomorrow to 02/21/2023)         Date Time Visit Type/Procedure Providers Loc / Dept Status        03/15/2022 0940 RELEASE, CARPAL TUNNEL - Right Floyde Parkins [facility] OR Scheduled      Visit Type/Procedure: RELEASE, CARPAL TUNNEL - Right       03/23/2022 1025 PARATHYROIDECTOMY - Bilateral Corena Pilgrim [facility] OR Scheduled      Visit Type/Procedure: PARATHYROIDECTOMY -  Bilateral       04/12/2022 0815 RELEASE, CARPAL TUNNEL - Left Floyde Parkins [facility] OR Scheduled      Visit Type/Procedure: RELEASE, CARPAL TUNNEL - Left                                    .     Language of care:Portuguese ([place])    Allergy History  Penicillins and Thiazide-Type Diuretics      Past Medical History:  06/22/2017: Astigmatism of both eyes with presbyopia  No date: HTN (hypertension)     has a past surgical history that includes Tubal ligation; OB ANTEPARTUM CARE C DLVR&POSTPARTUM; and LAPS FULG OVIDUXS.         Alcohol, Tobacco and Drug History:  Social History    Tobacco Use      Smoking status: Former        Packs/day: 0.33        Years: 10.00        Additional pack years: 0.00        Total pack years: 3.30        Types: Cigarettes        Quit date: 10/15/2012        Years since quitting: 9.3      Smokeless tobacco: Never      Tobacco comments: quit 3 months ago on 10/15/12    E-Cigarette/Vaping  E-Cigarette Use:   Quit Date:   Nicotine:   Start Date:   THC:    Cartridges/Day:   CBD:   Other Substance:   Flavoring:       Alcohol use   Yes      Comment:   minimal          Drug use:   No       Extended Emergency Contact Information  Primary Emergency Contact: [place]  Address: [address] [place], [place] [address] [place] Phone: [phone]  Mobile Phone: [phone]  Relation: Daughter  Secondary Emergency Contact: Livingston Diones  Address: [address] [place], [place] [address] [place] Phone: [phone]  Mobile Phone: [phone]  Relation: Daughter        Current Outpatient Medications   Medication Sig    ofloxacin (FLOXIN) 0.3 % otic solution Place 3 drops into the left ear in the morning and 3 drops before bedtime. Do all this for 3 days.    fluticasone (FLONASE) 50 MCG/ACT nasal spray 1 spray by Each Nostril route in the morning.    metoprolol (TOPROL-XL) 50 MG 24 hr tablet Take 1 tablet by mouth in the morning.    nabumetone  (RELAFEN) 750 MG tablet Take 1 tablet by mouth in the morning and 1 tablet before bedtime. Do all this for 14 days.    amLODIPine (NORVASC) 10 MG tablet Take 1 tablet by mouth in the morning.    D3-1000 25 MCG (1000 UT) tablet TAKE 2 TABLETS BY MOUTH DAILY    atorvastatin (LIPITOR) 10 MG tablet Take 1 tablet by mouth nightly    TRULICITY 1.5 UU/7.2ZD injection INJECT 1.5 MG UNDER THE SKIN ONCE A WEEK    metFORMIN (GLUCOPHAGE-XR) 750 MG 24 hr tablet TAKE 2 TABLETS BY MOUTH NIGHTLY    lisinopril (ZESTRIL) 40 MG tablet TAKE 1 TABLET BY MOUTH DAILY     No current facility-administered medications for this encounter.         (Not in a hospital admission)      PAT Assessment        Airways WDL     Activity tolerance (How many flights of stairs): flights 1 limited due to   pain in legs         Assistive Device: None  PAT Screening     Row Name 02/21/22 1025       Integumentary - Complete full head to toe skin assessment    Integumentary (WDL) WDL    Row Name 02/21/22 1029       STOP-Bang Questionaire    Do you often feel Tired, fatigued, or Sleepy during the daytime (such as   falling asleep when driving)? 0    Row Name 02/21/22 1027       STOP-Bang Questionaire     Do you snore loudly (loud enough to be heard through closed doors or   your bed-partner elbows you for snoring at night? 1  needs a sleep study     missed apppointment to call dr to rescheduled    Has anyone observed you stop breathing, or choking/gasping during your   sleep? 1    Do you have, or are being treated for, High Blood Pressure? [place] Name 02/21/22 1026       STOP-Bang Questionaire     Do you snore loudly (loud enough to be heard through closed doors or   your bed-partner elbows you for snoring at night? 0    Do you often feel Tired, fatigued, or Sleepy during the daytime (such as   falling asleep when driving)? 0    Has anyone observed you stop breathing, or choking/gasping during your   sleep? 0    Do you have, or  are being treated for, High Blood Pressure? 0                      There were no vitals filed for this visit.        Patient issues currently are none.  Medication instructions were given. AVS given to patient.   CHX wipes instructions will be given and reviewed on days of surgeries.  All questions were answered.  Patient expressed understanding of pre op instructions for  both surgeries  A [place] interpreter was present for interview  Maryruth Bun, RN

## 2022-02-21 NOTE — Discharge Instructions (Addendum)
INSTRUES DO PR-OPERATRIO PARA O SURGICAL DAY CARE   SURGICAL DAY CARE PRE-OPERATIVE INSTRUCTIONS    DIA DA CIRURGIA  DAY OF SURGERY Carpal tunnel release    Chegar em:  [facility] [id number]), Dezem[place] (December) 28 s (Time): 7:45 the office will call you the day before your surgery to confirm your arrival time.   Arrive at:  Registration on (Day of the Week, Month, Day, at (Time).        obrigatrio ter um adulto responsvel disponvel para acompanh-lo at [employer] a [place]. (Sugerimos que voc tenha algum disponvel para ajud-lo em casa aps a sua cirurgia).  You must have a responsible adult available to accompany you home after surgery. (We suggest that you have someone available to assist you at home after your surgery).    INSTRUES:   INSTRUCTIONS:  Voc no poder comer ou beber nada aps a meia-noite da noite anterior  cirurgia, nem mesmo gua, goma de mascar ou balas.   You may have nothing to eat or drink after midnight the night before your surgery, not even water, gum or hard candy.    Voc no poder fumar na manh do dia da sua cirurgia.  You may not smoke the morning of your surgery.    Por favor, deixe em casa todos os objetos de valor, incluindo joias, relgios, dinheiro, etc.  Please leave all valuables at home, including jewelry, watches, money, etc.    Remova qualquer esmalte das unhas antes de chegar ao Surgical Day Care e no use qualquer maquiagem ou batom.  Please remove all fingernail polish before arriving at Surgical Day Care and do not  wear any face or lip make-up.    Se for fazer cirurgia ocular, no aplique maquiagem nos olhos ou no rosto e evite hidratantes faciais e perfumes.  If you are having eye surgery, do not wear any eye or face makeup and avoid facial moisturizers and perfumes.    Por favor, retire quaisquer extenses de cabelo que possam ser [employer].  Please take out any hair extensions that can be removed.    No raspe os pelos da  rea da cirurgia.  Do not shave surgical site.    No use lentes de contato.  Do not wear contact lenses.    MEDICAMENTOS: PLEASE STOP Trulicity 7 days before surgery  MEDICATIONS:     Tome a medicao a seguir na noite anterior  cirurgia, na hora de dormir: Relafen ear gtts   Take the following medication the night before surgery at bedtime:     Tome a medicao a seguir na manh do dia da sua cirurgia, com apenas um gole de gua: amlodipine,Atorvastatin, Flonase, metoprolol, ear gtts  Take the following medication the morning of your surgery with only a sip of water:   NO LISINOPRIL OR METFORMIN ON THE MORNING OF SURGERY  Stop Trulicity 7 days prior to surgery(stop on  03/08/22)    Chg wipes as directed  [place]    DIA DA [place]   Parathyroid surgery 03/23/22  DAY OF SURGERY    Chegar em: [place] (Friday), [place] (January) 5 s (Time): 8:25 the office will call you the day before your surgery to confirm your arrival time.   Arrive at:  Registration on (Day of the Week, Month, Day, at (Time).        obrigatrio ter [employer]  responsvel disponvel para acompanh-lo at [employer] a [place]. (Sugerimos que voc tenha algum disponvel para ajud-lo em casa aps a sua cirurgia).  You must have a responsible adult available to accompany you home after surgery. (We suggest that you have someone available to assist you at home after your surgery).    INSTRUES:   INSTRUCTIONS:  Voc no poder comer ou beber nada aps a meia-noite da noite anterior  cirurgia, nem mesmo gua, goma de mascar ou balas.   You may have nothing to eat or drink after midnight the night before your surgery, not even water, gum or hard candy.    Voc no poder fumar na manh do dia da sua cirurgia.  You may not smoke the morning of your surgery.    Por favor, deixe em casa todos os objetos de valor, incluindo joias,  relgios, dinheiro, etc.  Please leave all valuables at home, including jewelry, watches, money, etc.    Remova qualquer esmalte das unhas antes de chegar ao Surgical Day Care e no use qualquer maquiagem ou batom.  Please remove all fingernail polish before arriving at Surgical Day Care and do not  wear any face or lip make-up.    Se for fazer cirurgia ocular, no aplique maquiagem nos olhos ou no rosto e evite hidratantes faciais e perfumes.  If you are having eye surgery, do not wear any eye or face makeup and avoid facial moisturizers and perfumes.    Por favor, retire quaisquer extenses de cabelo que possam ser [employer].  Please take out any hair extensions that can be removed.    No raspe os pelos da rea da cirurgia.  Do not shave surgical site.    No use lentes de contato.  Do not wear contact lenses.    MEDICAMENTOS:   MEDICATIONS:  hold Trulicity for 7 days prior to surgery that will be 2 weeks straight .Please give your primary doctor a call to let him know about holding Trulicity for 2 weeks  for  both surgeries    Tome a medicao a seguir na noite anterior  [place], na hora de dormir: usual medications , Ear gtts Flonase   Take the following medication the night before surgery at bedtime:     Tome a medicao a seguir na manh do dia da sua cirurgia, com apenas um gole de gua: amlodipine,atorvastatin, metoprolol, ear gtts     No lisinopril or Metformin on the morning of your surgery    Chg wipes to be done on morning of surgery  Take the following medication the morning of your surgery with only a sip of water:

## 2022-02-22 ENCOUNTER — Other Ambulatory Visit (HOSPITAL_BASED_OUTPATIENT_CLINIC_OR_DEPARTMENT_OTHER): Payer: Self-pay | Admitting: Otolaryngology

## 2022-02-22 NOTE — Telephone Encounter (Signed)
[facility], Amanda Cervantes is a 56 year old female has requested a refill of      - ofloxacin (FLOXIN) 0.3 % otic solution     Previous script was tamperproof. Please review it.       Last Office Visit: 02/20/22 with pcp  Last Physical Exam: 02/22/20     DM EYE EXAM due on 03/24/2022     Other Med Adult:  Most Recent BP Reading(s)  02/20/22 : 143/89        Cholesterol (mg/dL)   Date Value   01/15/2022 116     LOW DENSITY LIPOPROTEIN DIRECT (mg/dL)   Date Value   01/15/2022 69     HIGH DENSITY LIPOPROTEIN (mg/dL)   Date Value   01/15/2022 43     TRIGLYCERIDES (mg/dL)   Date Value   01/15/2022 109         THYROID SCREEN TSH REFLEX FT4 (uIU/mL)   Date Value   08/10/2021 1.950         No results found for: "TSH"    HEMOGLOBIN A1C (%)   Date Value   05/30/2020 8.3 (*H)       POC HEMOGLOBIN A1C (%)   Date Value   01/15/2022 6.3 (H)         No results found for: "INR"    SODIUM (mmol/L)   Date Value   12/08/2021 140       POTASSIUM (mmol/L)   Date Value   12/08/2021 4.2           CREATININE (mg/dL)   Date Value   12/08/2021 0.8        Documented patient preferred pharmacies:    [facility], [place]  Phone: [phone] Fax: [phone]

## 2022-02-23 ENCOUNTER — Telehealth (HOSPITAL_BASED_OUTPATIENT_CLINIC_OR_DEPARTMENT_OTHER): Payer: Self-pay | Admitting: Pharmacist

## 2022-02-23 MED ORDER — OFLOXACIN 0.3 % OT SOLN
3.00 [drp] | Freq: Two times a day (BID) | OTIC | 0 refills | Status: AC
Start: 2022-02-23 — End: 2022-02-26

## 2022-02-23 NOTE — Telephone Encounter (Signed)
Hypertension/Elevated BP and Outreach    Date of missed visit: 02/12/22    Attempt: 1st    For in person visit     Outcome:   Left message to schedule via clinic    Should pt return my call:  Please schedule with [facility] pharmacotherapy scheduling subgroup, rx consult 30

## 2022-02-26 ENCOUNTER — Other Ambulatory Visit: Payer: Self-pay

## 2022-02-26 ENCOUNTER — Ambulatory Visit: Payer: No Typology Code available for payment source | Attending: Orthopaedic Surgery | Admitting: Surgical

## 2022-02-26 ENCOUNTER — Encounter (HOSPITAL_BASED_OUTPATIENT_CLINIC_OR_DEPARTMENT_OTHER): Payer: Self-pay | Admitting: Surgical

## 2022-02-26 VITALS — BP 136/96 | HR 85 | Temp 98.7°F | Resp 16

## 2022-02-26 DIAGNOSIS — G5601 Carpal tunnel syndrome, right upper limb: Secondary | ICD-10-CM | POA: Diagnosis present

## 2022-02-26 NOTE — Progress Notes (Unsigned)
CHG wipes given to patient by Francia Greaves, RN  under the direction of  Lucious Groves PA/Dr Adelene Amas   Directions in [place] given and reviewed with patient  prior to surgery    Pt was able to  Verbalize understanding  Also  NPO after midnight the day prior to surgery,    Patient informed of number to call at clinic  with any questions.

## 2022-02-27 ENCOUNTER — Other Ambulatory Visit (HOSPITAL_BASED_OUTPATIENT_CLINIC_OR_DEPARTMENT_OTHER): Payer: Self-pay | Admitting: Internal Medicine

## 2022-02-27 ENCOUNTER — Other Ambulatory Visit: Payer: Self-pay

## 2022-02-27 ENCOUNTER — Ambulatory Visit
Admission: RE | Admit: 2022-02-27 | Discharge: 2022-02-27 | Disposition: A | Discharge status: Home / Self Care | Payer: No Typology Code available for payment source | Attending: Diagnostic Radiology | Admitting: Diagnostic Radiology

## 2022-02-27 ENCOUNTER — Ambulatory Visit (HOSPITAL_BASED_OUTPATIENT_CLINIC_OR_DEPARTMENT_OTHER)
Admit: 2022-02-27 | Discharge: 2022-02-27 | Disposition: A | Discharge status: Home / Self Care | Payer: No Typology Code available for payment source

## 2022-02-27 DIAGNOSIS — Z780 Asymptomatic menopausal state: Secondary | ICD-10-CM | POA: Diagnosis present

## 2022-02-27 DIAGNOSIS — E210 Primary hyperparathyroidism: Secondary | ICD-10-CM

## 2022-02-27 DIAGNOSIS — M85852 Other specified disorders of bone density and structure, left thigh: Secondary | ICD-10-CM | POA: Diagnosis present

## 2022-02-28 NOTE — H&P (View-Only) (Signed)
ORTHOPEDIC PROBLEM LIST:  1. Bilateral moderate to severe carpal tunnel syndrome, R>L  2. Bilateral hand numbness and tingling  3. Bilateral wrist pain     HPI: Amanda Cervantes is a 56year old RHD, [place] speaking female who presents today for preoperative H&P for a planned right carpal tunnel release with Dr. Adelene Amas 03/15/22. A [place] telephone interpreter was used for today's encounter. She denies any recent injuries or changes in her medical history. She is wearing her brace at night and her symptoms unchanged. She reports pain and numbness to her entire hand and fingers. She would have pain when holding up a fork to eat. 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     Past Medical History:  06/22/2017: Astigmatism of both eyes with presbyopia  No date: HTN (hypertension)    Past Surgical History:  No date: LAPAROSCOPY FULGURATION OVIDUCTS  No date: OB ANTEPARTUM CARE CESAREAN DLVR & POSTPARTUM  No date: TUBAL LIGATION    Review of Patient's Allergies indicates:   Penicillins             Other (See Comments)    Comment:Took skin 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 & was positive, never             actually took it   Thiazide-type diure*    Other (See Comments)    Comment:Stopped due to low K+    ROS  Pertinent positives in HPI     PHYSICAL EXAM:  GENERAL: Alert and oriented, in no acute distress  HEENT: Normocephalic and atraumatic. PERRLA  LUNGS: Non-labored breathing and speaking in complete sentences.  CARDIAC: RRR  MUSCULOSKELETAL: Evaluation of bilateral hands shows no gross bony abnormalities. Mild thenar wasting, worse on the right than on the left. No additional overlying skin changes; skin is warm, dry, intact. NTTP throughout. Full ROM of the wrists b/l. Readily able to flex and extend all fingers with and without resistance without deficit. - Tinel's bilaterally. + Durkin's, Phalen's b/l.  Negative Tinel's at the cubital tunnel bilaterally.  Deep and superficial sensation intact and equal bilaterally.  2+  cap refill in all fingers, 2+ radial pulses bilaterally.     EMG: EMG performed by Dr. 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  Evidence for partial motor conduction block bilaterally, worse on the right.  Evidence suggestive of an ulnar neuropathy on the right though the site of involvement was not localized.  Presence of right C8 radiculopathy cannot be excluded.  2+ right first dorsal interosseous needle changes; 1+ right abductor pollicis brevis needle change.  1+ left abductor pollicis brevis needle change.    ASSESSMENT/PLAN: 56 year old female presenting today for preoperative H&P for a planned right carpal tunnel release withe Dr. Adelene Amas 03/15/22. She will be going home the same day. She had her PAT visit 02/21/22. She is to be NPO midnight before. CHG wipes with directions and Ensure drink was given to patient. She is to stop any aspirin or NSAIDs 5 days prior to surgery. She is also planned to have a parathyroidectomy with Dr. Orlin Hilding 03/23/22 and a left carpal tunnel release with Dr. Adelene Amas 04/12/22. All of her questions and concerns were addressed today.    Patient was seen in independent PA clinic.    I spent a total of 30 minutes face to face with patient and/or floor /unit time of which > than 50% was spent counseling/coordinating care.     [name]  [name], PA-C

## 2022-02-28 NOTE — Progress Notes (Signed)
ORTHOPEDIC PROBLEM LIST:  1. Bilateral moderate to severe carpal tunnel syndrome, R>L  2. Bilateral hand numbness and tingling  3. Bilateral wrist pain     HPI: Amanda Cervantes is a 56year old RHD, [place] speaking female who presents today for preoperative H&P for a planned right carpal tunnel release with Dr. Adelene Amas 03/15/22. A [place] telephone interpreter was used for today's encounter. She denies any recent injuries or changes in her medical history. She is wearing her brace at night and her symptoms unchanged. She reports pain and numbness to her entire hand and fingers. She would have pain when holding up a fork to eat. 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     Past Medical History:  06/22/2017: Astigmatism of both eyes with presbyopia  No date: HTN (hypertension)    Past Surgical History:  No date: LAPAROSCOPY FULGURATION OVIDUCTS  No date: OB ANTEPARTUM CARE CESAREAN DLVR & POSTPARTUM  No date: TUBAL LIGATION    Review of Patient's Allergies indicates:   Penicillins             Other (See Comments)    Comment:Took skin 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 & was positive, never             actually took it   Thiazide-type diure*    Other (See Comments)    Comment:Stopped due to low K+    ROS  Pertinent positives in HPI     PHYSICAL EXAM:  GENERAL: Alert and oriented, in no acute distress  HEENT: Normocephalic and atraumatic. PERRLA  LUNGS: Non-labored breathing and speaking in complete sentences.  CARDIAC: RRR  MUSCULOSKELETAL: Evaluation of bilateral hands shows no gross bony abnormalities. Mild thenar wasting, worse on the right than on the left. No additional overlying skin changes; skin is warm, dry, intact. NTTP throughout. Full ROM of the wrists b/l. Readily able to flex and extend all fingers with and without resistance without deficit. - Tinel's bilaterally. + Durkin's, Phalen's b/l.  Negative Tinel's at the cubital tunnel bilaterally.  Deep and superficial sensation intact and equal bilaterally.  2+  cap refill in all fingers, 2+ radial pulses bilaterally.     EMG: EMG performed by Dr. 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  Evidence for partial motor conduction block bilaterally, worse on the right.  Evidence suggestive of an ulnar neuropathy on the right though the site of involvement was not localized.  Presence of right C8 radiculopathy cannot be excluded.  2+ right first dorsal interosseous needle changes; 1+ right abductor pollicis brevis needle change.  1+ left abductor pollicis brevis needle change.    ASSESSMENT/PLAN: 56 year old female presenting today for preoperative H&P for a planned right carpal tunnel release withe Dr. Adelene Amas 03/15/22. She will be going home the same day. She had her PAT visit 02/21/22. She is to be NPO midnight before. CHG wipes with directions and Ensure drink was given to patient. She is to stop any aspirin or NSAIDs 5 days prior to surgery. She is also planned to have a parathyroidectomy with Dr. Orlin Hilding 03/23/22 and a left carpal tunnel release with Dr. Adelene Amas 04/12/22. All of her questions and concerns were addressed today.    Patient was seen in independent PA clinic.    I spent a total of 30 minutes face to face with patient and/or floor /unit time of which > than 50% was spent counseling/coordinating care.     [name]  [name], PA-C

## 2022-02-28 NOTE — Addendum Note (Signed)
Addended by: Ronette Deter on: 02/28/2022 05:12 PM     Modules accepted: Orders

## 2022-03-05 ENCOUNTER — Ambulatory Visit (HOSPITAL_BASED_OUTPATIENT_CLINIC_OR_DEPARTMENT_OTHER): Payer: No Typology Code available for payment source | Admitting: Podiatrist

## 2022-03-05 ENCOUNTER — Encounter (HOSPITAL_BASED_OUTPATIENT_CLINIC_OR_DEPARTMENT_OTHER): Payer: Self-pay | Admitting: Internal Medicine

## 2022-03-05 DIAGNOSIS — M8580 Other specified disorders of bone density and structure, unspecified site: Secondary | ICD-10-CM | POA: Insufficient documentation

## 2022-03-07 ENCOUNTER — Ambulatory Visit: Payer: No Typology Code available for payment source | Attending: Internal Medicine | Admitting: Registered Nurse

## 2022-03-07 ENCOUNTER — Other Ambulatory Visit: Payer: Self-pay

## 2022-03-07 ENCOUNTER — Encounter (HOSPITAL_BASED_OUTPATIENT_CLINIC_OR_DEPARTMENT_OTHER): Payer: Self-pay | Admitting: Endocrinology

## 2022-03-07 VITALS — BP 135/86 | HR 70

## 2022-03-07 DIAGNOSIS — Z0130 Encounter for examination of blood pressure without abnormal findings: Secondary | ICD-10-CM | POA: Diagnosis not present

## 2022-03-07 NOTE — Progress Notes (Signed)
[facility] RN scheduled visit:   HYPERTENSION EDUCATION & BLOOD PRESSURE ASSESSMENT    Pt arrived, was greeted and identified for scheduled Team RN visit for Hypertension follow up   @ [facility]    Allergies, medications and EPIC chart were reviewed and confirmed with pt     Pt states she feels good, denies any swelling/edema, denies headaches or dizziness    Medications reviewed and confirmed with pt.   Patient reports she is taking all prescribed medications as directed.     Metoprolol 50 mg daily  amlodipine 10 mg daily  Lisinopril 40 mg daily    Blood Pressure Readings   Date 03/07/22 03/07/22    Blood Pressure 149/89 Left 135/86 ( right)        Pt has scheduled f/u appointment for HTN with Robin Heafey  Reviewed/discussed BP and f/u plan of care with Dr Patrice Paradise     Patient was educated regarding Follow Up Plan for Blood Pressure as advised by Dr Patrice Paradise :    1)  Medication: continue current medication regimen unchanged  2)  Dietary sodium restriction  3)  Regular aerobic exercise      Pt was instructed to call and report any symptoms of not feeling well, headache, or edema/swelling  Patient verbalized understanding and agrees with plan and will f/u as scheduled         Will route to PCP and [facility]    Harrison Mons, RN 03/07/22 3:41 PM

## 2022-03-08 ENCOUNTER — Ambulatory Visit: Payer: No Typology Code available for payment source | Attending: Surgery | Admitting: Surgery

## 2022-03-08 DIAGNOSIS — E210 Primary hyperparathyroidism: Secondary | ICD-10-CM | POA: Diagnosis not present

## 2022-03-08 NOTE — Progress Notes (Signed)
56 yr old lady with primary hyperparathyroidism  Returns for scheduled f/u after Bone Density studies    BP meds adjusted, doing well  No new symptoms  PMH unchanged  Records all reviewed, including imaging    Patient awaiting carpal tunnel surgery next week    PE  Well appearing  Neck exam - no lump felt      LABS  Component  Ref Range & Units 12/08/21 1107   ALDOSTERONE  0.0 - 30.0 ng/dL 4.4      RENIN PLASMA ACTIVITY  0.167 - 5.380 ng/mL/hr 0.699      ALDOSTERONE/RENIN RATIO  0.0 - 30.0 6.3              Component  Ref Range & Units 12/08/21 1107   NORMETANEPHRINE, PL  0.0 - 244.0 pg/mL 128.7      METANEPHRINE,PL  0.0 - 88.0 pg/mL 23.6            12/11/21 0901    CORTISOL AM POST DEXAMETHASONE  0.00 - 5.00 ug/dL 1.62                 BONE DENSITOMETRY 02/27/22    IMPRESSION:     The lowest measured BMD is in the left hip with a T-score of -1.6. This patient is considered to have low bone mass (osteopenia) according to the World Health Organization [facility]) criteria.     CT abdomen 12/22/21  IMPRESSION:   1. Tiny benign right adrenal gland adenoma, not significantly changed in appearance dating back to September 2017.  2. Nonobstructing punctate right lower pole nephrolith.      Reviewed and Electronically Signed By: Alyson Ingles, MD    PLAN  56 yr old lady with primary hyperparathyroidism  Abnormal gland likely R lower    Again discussed mx rationale  Technical details of surgery  Potential complications, including failure to locate gland  Also discussed timing - PT sx to follow Carpal Tunnel. If unwell from CT sx, she is to let us know.    Interpreter used.

## 2022-03-08 NOTE — H&P (View-Only) (Signed)
56 yr old lady with primary hyperparathyroidism  Returns for scheduled f/u after Bone Density studies    BP meds adjusted, doing well  No new symptoms  PMH unchanged  Records all reviewed, including imaging    Patient awaiting carpal tunnel surgery next week    PE  Well appearing  Neck exam - no lump felt      LABS  Component  Ref Range & Units 12/08/21 1107   ALDOSTERONE  0.0 - 30.0 ng/dL 4.4      RENIN PLASMA ACTIVITY  0.167 - 5.380 ng/mL/hr 0.699      ALDOSTERONE/RENIN RATIO  0.0 - 30.0 6.3              Component  Ref Range & Units 12/08/21 1107   NORMETANEPHRINE, PL  0.0 - 244.0 pg/mL 128.7      METANEPHRINE,PL  0.0 - 88.0 pg/mL 23.6            12/11/21 0901    CORTISOL AM POST DEXAMETHASONE  0.00 - 5.00 ug/dL 1.62                 BONE DENSITOMETRY 02/27/22    IMPRESSION:     The lowest measured BMD is in the left hip with a T-score of -1.6. This patient is considered to have low bone mass (osteopenia) according to the World Health Organization [facility]) criteria.     CT abdomen 12/22/21  IMPRESSION:   1. Tiny benign right adrenal gland adenoma, not significantly changed in appearance dating back to September 2017.  2. Nonobstructing punctate right lower pole nephrolith.      Reviewed and Electronically Signed By: Alyson Ingles, MD    PLAN  56 yr old lady with primary hyperparathyroidism  Abnormal gland likely R lower    Again discussed mx rationale  Technical details of surgery  Potential complications, including failure to locate gland  Also discussed timing - PT sx to follow Carpal Tunnel. If unwell from CT sx, she is to let us know.    Interpreter used.

## 2022-03-13 ENCOUNTER — Encounter (HOSPITAL_BASED_OUTPATIENT_CLINIC_OR_DEPARTMENT_OTHER): Payer: Self-pay

## 2022-03-13 ENCOUNTER — Ambulatory Visit
Admission: RE | Admit: 2022-03-13 | Discharge: 2022-03-13 | Disposition: A | Discharge status: Home / Self Care | Payer: No Typology Code available for payment source

## 2022-03-13 ENCOUNTER — Other Ambulatory Visit: Payer: Self-pay

## 2022-03-13 HISTORY — DX: Type 2 diabetes mellitus without complications: E11.9

## 2022-03-13 NOTE — Surgery Pre-Op (Addendum)
PAT Visit          Amanda Cervantes is a 56 year old female received a preoperative screening.        Appointments for Next 30 Days 03/13/2022 - 04/12/2022        Date Visit Type Department Provider     03/13/2022  3:00 PM PRE-ADMISSION [facility] [place]    Patient Instructions:     On this date, you will receive a phone call from a [facility] nurse to discuss your upcoming procedure. You do not need to come to the hospital for this appointment                 03/28/2022 10:00 AM PRE-ADMISSION [facility]    Patient Instructions:                  04/05/2022  3:15 PM OFF 15 F/U [facility] Corena Pilgrim, MD    Patient Instructions:                  04/06/2022  3:30 PM RX CONSULT [place]    Patient Instructions:     Your visit is scheduled with our clinical pharmacist, who is an expert in managing medications. Please bring the following things to your scheduled appointment: your medication bottles, blood pressure monitor, blood sugar monitor, and logs for blood sugar/pressure, if you have them. Please arrive 15 minutes early to check-in.                      Surgery Information       Future Procedures (Tomorrow to 03/13/2023)         Date Time Visit Type/Procedure Providers Loc / Dept Status        03/15/2022 0940 RELEASE, CARPAL TUNNEL - Right Floyde Parkins [facility] OR Scheduled      Visit Type/Procedure: RELEASE, CARPAL TUNNEL - Right       03/23/2022 1025 PARATHYROIDECTOMY - Bilateral Corena Pilgrim [facility] OR Scheduled      Visit Type/Procedure: PARATHYROIDECTOMY - Bilateral       04/12/2022 0815 RELEASE, CARPAL TUNNEL - Left Floyde Parkins [facility] OR Scheduled      Visit Type/Procedure: RELEASE, CARPAL TUNNEL - Left                                    .     Language of care:Portuguese  ([place])    Allergy History  Penicillins and Thiazide-Type Diuretics      Past Medical History:  06/22/2017: Astigmatism of both eyes with presbyopia  No date: Diabetes mellitus ([place])  No date: HTN (hypertension)     has a past surgical history that includes Tubal ligation; OB ANTEPARTUM CARE C DLVR&POSTPARTUM; LAPS FULG OVIDUXS; and Toe Surgery (Left).         Alcohol, Tobacco and Drug History:  Social History    Tobacco Use      Smoking status: Former        Packs/day: 0.33        Years: 10.00        Additional pack years: 0.00        Total pack years: 3.30  Types: Cigarettes        Quit date: 10/15/2012        Years since quitting: 9.4      Smokeless tobacco: Never      Tobacco comments: quit 3 months ago on 10/15/12    E-Cigarette/Vaping  E-Cigarette Use: Never User  Quit Date:   Nicotine:   Start Date:   THC:    Cartridges/Day:   CBD:   Other Substance:   Flavoring:       Alcohol use   Yes      Comment:   minimal          Drug use:   No       Extended Emergency Contact Information  Primary Emergency Contact: Lindsey  Address: [address] [place], [place] [address] [place] Phone: [phone]  Mobile Phone: [phone]  Relation: Daughter  Secondary Emergency Contact: Livingston Diones  Address: [place], [place] [address] [place] Phone: [phone]  Mobile Phone: [phone]  Relation: Daughter        Current Outpatient Medications   Medication Sig    acetaminophen (TYLENOL) 500 MG tablet Take 500 mg by mouth every 6 (six) hours as needed for Pain    ibuprofen (ADVIL) 600 MG tablet Take 600 mg by mouth every 6 (six) hours as needed for Pain    fluticasone (FLONASE) 50 MCG/ACT nasal spray 1 spray by Each Nostril route in the morning.    metoprolol (TOPROL-XL) 50 MG 24 hr tablet Take 1 tablet by mouth in the morning.    nabumetone (RELAFEN) 750 MG tablet Take 1 tablet by mouth in the morning and 1 tablet before bedtime. Do all this for  14 days.    amLODIPine (NORVASC) 10 MG tablet Take 1 tablet by mouth in the morning.    D3-1000 25 MCG (1000 UT) tablet TAKE 2 TABLETS BY MOUTH DAILY    atorvastatin (LIPITOR) 10 MG tablet Take 1 tablet by mouth nightly    TRULICITY 1.5 QI/3.4VQ injection INJECT 1.5 MG UNDER THE SKIN ONCE A WEEK    metFORMIN (GLUCOPHAGE-XR) 750 MG 24 hr tablet TAKE 2 TABLETS BY MOUTH NIGHTLY    lisinopril (ZESTRIL) 40 MG tablet TAKE 1 TABLET BY MOUTH DAILY     No current facility-administered medications for this encounter.         (Not in a hospital admission)      PAT Assessment             Activity tolerance (How many flights of stairs): trouble with stairs D/T   pain         Assistive Device: None                                  PAT Screening     Row Name 03/13/22 1224       Integumentary - Complete full head to toe skin assessment    Integumentary (WDL) WDL    Row Name 03/13/22 1225       STOP-Bang Questionaire     Do you snore loudly (loud enough to be heard through closed doors or   your bed-partner elbows you for snoring at night? 1    Do you often feel Tired, fatigued, or Sleepy during the  daytime (such as   falling asleep when driving)? 0    Has anyone observed you stop breathing, or choking/gasping during your   sleep? 1    Do you have, or are being treated for, High Blood Pressure? 1    Height 5' 2.5" (1.588 m)    Weight 82.6 kg (182 lb)    BMI (Calculated) 32.83                       03/13/22  1225   Weight: 82.6 kg (182 lb)   Height: 5' 2.5" (1.588 m)         [place] interpreter present for phone screen Patient  last dose of TRULICITY WAS 64/35/39 . Patient also has held metformin( instructed by unknown source).  Instructed patient to restart metformin , Pt states only take at dinner.   Patient expressed understanding  Medication instructions were given. AVS given to patient.          Vance Peper, RN

## 2022-03-13 NOTE — Discharge Instructions (Addendum)
INSTRUES DO PR-OPERATRIO PARA O SURGICAL DAY CARE   SURGICAL DAY CARE PRE-OPERATIVE INSTRUCTIONS    DIA DA CIRURGIA  DAY OF SURGERY    Chegar em: [facility] (Thursday), Dezembro (December) 28 s (Time): 8:10 am .   Arrive at:  Registration on (Day of the Week, Month, Day, at (Time).        obrigatrio ter um adulto responsvel disponvel para acompanh-lo at [employer] a [place]. (Sugerimos que voc tenha algum disponvel para ajud-lo em casa aps a sua cirurgia).  You must have a responsible adult available to accompany you home after surgery. (We suggest that you have someone available to assist you at home after your surgery).    INSTRUES:   INSTRUCTIONS:  Voc no poder comer ou beber nada aps a meia-noite da noite anterior  cirurgia, nem mesmo gua, goma de mascar ou balas.   You may have nothing to eat or drink after midnight the night before your surgery, not even water, gum or hard candy.    Voc no poder fumar na manh do dia da sua cirurgia.  You may not smoke the morning of your surgery.    Por favor, deixe em casa todos os objetos de valor, incluindo joias, relgios, dinheiro, etc.  Please leave all valuables at home, including jewelry, watches, money, etc.    Remova qualquer esmalte das unhas antes de chegar ao Surgical Day Care e no use qualquer maquiagem ou batom.  Please remove all fingernail polish before arriving at Surgical Day Care and do not  wear any face or lip make-up.    Se for fazer cirurgia ocular, no aplique maquiagem nos olhos ou no rosto e evite hidratantes faciais e perfumes.  If you are having eye surgery, do not wear any eye or face makeup and avoid facial moisturizers and perfumes.    Por favor, retire quaisquer extenses de cabelo que possam ser [employer].  Please take out any hair extensions that can be removed.    No raspe os pelos da rea da cirurgia.  Do not shave surgical site.    No use lentes de contato.  Do not wear contact  lenses.    MEDICAMENTOS:   MEDICATIONS:     STARTING TODAY  DO NOT TAKE ASPIRIN, MOTRIN, IBUPROFEN,ADVIL, ALEVE, OR NAPROXEN MAY TAKE TYLENOL AS NEEDED    Tome a medicao a seguir na noite anterior  [place], na hora de dormirnol if needed : Metformin and tylenol  Take the following medication the night before surgery at bedtime:     Tome a [place] a seguir na manh do dia da sua cirurgia, com apenas um gole de gua: AMLODIPINE, FLONASE METOPROLOL, ATORVASTATIN   Take the following medication the morning of your surgery with only a sip of water:

## 2022-03-15 ENCOUNTER — Ambulatory Visit (HOSPITAL_BASED_OUTPATIENT_CLINIC_OR_DEPARTMENT_OTHER)
Admission: RE | Disposition: A | Discharge status: Home / Self Care | Payer: Self-pay | Source: Ambulatory Visit | Attending: Specialist

## 2022-03-15 ENCOUNTER — Encounter (HOSPITAL_BASED_OUTPATIENT_CLINIC_OR_DEPARTMENT_OTHER): Payer: Self-pay | Admitting: Specialist

## 2022-03-15 ENCOUNTER — Ambulatory Visit (HOSPITAL_BASED_OUTPATIENT_CLINIC_OR_DEPARTMENT_OTHER): Payer: No Typology Code available for payment source

## 2022-03-15 ENCOUNTER — Other Ambulatory Visit: Payer: Self-pay

## 2022-03-15 ENCOUNTER — Ambulatory Visit
Admission: RE | Admit: 2022-03-15 | Discharge: 2022-03-15 | Disposition: A | Discharge status: Home / Self Care | Payer: No Typology Code available for payment source | Attending: Specialist | Admitting: Specialist

## 2022-03-15 DIAGNOSIS — E7800 Pure hypercholesterolemia, unspecified: Secondary | ICD-10-CM | POA: Insufficient documentation

## 2022-03-15 DIAGNOSIS — I10 Essential (primary) hypertension: Secondary | ICD-10-CM | POA: Diagnosis not present

## 2022-03-15 DIAGNOSIS — G5601 Carpal tunnel syndrome, right upper limb: Secondary | ICD-10-CM | POA: Diagnosis present

## 2022-03-15 DIAGNOSIS — Z87891 Personal history of nicotine dependence: Secondary | ICD-10-CM | POA: Diagnosis not present

## 2022-03-15 DIAGNOSIS — E119 Type 2 diabetes mellitus without complications: Secondary | ICD-10-CM | POA: Insufficient documentation

## 2022-03-15 LAB — BLOOD SUGAR FINGERSTICK (POINT OF CARE): FINGERSTICK GLUCOSE: 125 mg/dl (ref 74–160)

## 2022-03-15 SURGERY — RELEASE, CARPAL TUNNEL
Anesthesia: Monitor Anesthesia Care | Site: Hand | Laterality: Right | Wound class: Class I/ Clean

## 2022-03-15 MED ORDER — SODIUM CHLORIDE 0.9 % MINI-BAG PLUS
3.00 g | Freq: Once | INTRAVENOUS | Status: AC
Start: 2022-03-15 — End: 2022-03-15
  Administered 2022-03-15: 3 g via INTRAVENOUS
  Filled 2022-03-15: qty 15.31

## 2022-03-15 MED ORDER — FENTANYL CITRATE 0.05 MG/ML IJ SOLN
INTRAMUSCULAR | Status: AC
Start: 2022-03-15 — End: 2022-03-15
  Filled 2022-03-15: qty 2

## 2022-03-15 MED ORDER — ONDANSETRON HCL 4 MG/2ML IJ SOLN
4.0000 mg | Freq: Once | INTRAMUSCULAR | Status: DC | PRN
Start: 2022-03-15 — End: 2022-03-15

## 2022-03-15 MED ORDER — LACTATED RINGERS IV SOLN
INTRAVENOUS | Status: DC
Start: 2022-03-15 — End: 2022-03-15

## 2022-03-15 MED ORDER — BUPIVACAINE HCL (PF) 0.5 % IJ SOLN
INTRAMUSCULAR | Status: AC
Start: 2022-03-15 — End: 2022-03-15
  Administered 2022-03-15: 10 mL
  Filled 2022-03-15: qty 20

## 2022-03-15 MED ORDER — LACTATED RINGERS IV BOLUS
INTRAVENOUS | Status: DC | PRN
Start: 2022-03-15 — End: 2022-03-15

## 2022-03-15 MED ORDER — IBUPROFEN 600 MG PO TABS
600.0000 mg | ORAL_TABLET | Freq: Four times a day (QID) | ORAL | 0 refills | Status: DC | PRN
Start: 2022-03-15 — End: 2022-03-23

## 2022-03-15 MED ORDER — MIDAZOLAM HCL 2 MG/2 ML IJ SOLN
INTRAMUSCULAR | Status: AC
Start: 2022-03-15 — End: 2022-03-15
  Filled 2022-03-15: qty 2

## 2022-03-15 MED ORDER — ACETAMINOPHEN 500 MG PO TABS
1000.0000 mg | ORAL_TABLET | Freq: Three times a day (TID) | ORAL | 0 refills | Status: DC | PRN
Start: 2022-03-15 — End: 2022-03-23

## 2022-03-15 MED ORDER — FENTANYL CITRATE 0.05 MG/ML IJ SOLN
25.0000 ug | INTRAMUSCULAR | Status: DC | PRN
Start: 2022-03-15 — End: 2022-03-15

## 2022-03-15 MED ORDER — PROPOFOL 500 MG/50 ML IV
INTRAVENOUS | Status: DC | PRN
Start: 2022-03-15 — End: 2022-03-15
  Administered 2022-03-15: 100 ug/kg/min via INTRAVENOUS
  Administered 2022-03-15: 100 mg via INTRAVENOUS

## 2022-03-15 MED ORDER — METOCLOPRAMIDE HCL 5 MG/ML IJ SOLN
10.0000 mg | Freq: Once | INTRAMUSCULAR | Status: DC | PRN
Start: 2022-03-15 — End: 2022-03-15

## 2022-03-15 MED ORDER — OXYCODONE-ACETAMINOPHEN 5-325 MG PO TABS
1.0000 | ORAL_TABLET | ORAL | Status: DC | PRN
Start: 2022-03-15 — End: 2022-03-15

## 2022-03-15 MED ORDER — LIDOCAINE HCL 1 % IJ SOLN
INTRAMUSCULAR | Status: AC
Start: 2022-03-15 — End: 2022-03-15
  Administered 2022-03-15: 10 mL via SUBCUTANEOUS
  Filled 2022-03-15: qty 20

## 2022-03-15 MED ORDER — FENTANYL CITRATE 0.05 MG/ML IJ SOLN
Freq: Once | INTRAMUSCULAR | Status: DC | PRN
Start: 2022-03-15 — End: 2022-03-15
  Administered 2022-03-15 (×2): 50 ug via INTRAVENOUS

## 2022-03-15 MED ORDER — MIDAZOLAM HCL 2 MG/2 ML IJ SOLN
Freq: Once | INTRAMUSCULAR | Status: DC | PRN
Start: 2022-03-15 — End: 2022-03-15
  Administered 2022-03-15: 2 mg via INTRAVENOUS

## 2022-03-15 SURGICAL SUPPLY — 22 items
18IN TOURNIQUET CUFF (SURGPNE) ×1 IMPLANT
25G X 1IN SAFETY NEEDLE (NEEDLE) ×1 IMPLANT
ACE BANDAGE STERILE 2IN L/F (BANDAGE) ×1 IMPLANT
BULKEE II GAUZE BANDAGE 4.5 (BANDAGE) ×1 IMPLANT
CHLORAPREP APPLICAOR 26ML ORGN (PREP) ×1 IMPLANT
DISPOSABLE ALUMINUM HAND LG (SOFTGOOD) ×1 IMPLANT
DRESSING SPONGE 4X4 (DRESSING) ×5 IMPLANT
ELECTROSURGICAL SAFETY HOLSTER (CAUTERY) ×1 IMPLANT
EXTENSION SET MACROBORE (IVSETS) ×1 IMPLANT
INSYTE IV CATHETER 20GAX1.16IN (IVCATH) ×1 IMPLANT
IV ADMINISTRAT SET CSP152VSL (IVSETS) ×1 IMPLANT
IV SET SECONDARY TRANSFER (IVSETS) ×1 IMPLANT
LACTATED RINGER IV 1000 L7500 (SOLUTION) ×1 IMPLANT
NON-REBR MASK ADULT VINYL 1935 (O2) ×1 IMPLANT
OXYGEN MASK ADULT VINYL 1935 (O2) ×1 IMPLANT
PACK HAND (PACK) ×1 IMPLANT
SAFETY SCALPEL BLADE SZ 15 (SURGBLA) ×2 IMPLANT
SPONGE,8X4,XRAY,RFID (DRESSING) ×1 IMPLANT
SURGEON GLOVE LF/PF 8 STER (GLOVE) ×1 IMPLANT
SUTURE NYLON 4-0 P12 18IN (SUTURE) ×1 IMPLANT
SYRINGE 10CC LL (SYRINGE) ×1 IMPLANT
SZ 8 L/F ORTH SURG GLOVE (GLOVE) ×1 IMPLANT

## 2022-03-15 NOTE — Op Note (Signed)
Procedure(s):  RELEASE, CARPAL TUNNEL Operative Note     Date: 03/15/2022  Location: [place] OR    Name: Amanda Cervantes, DOB: Jun 17, 1965, MRN: [id number]    Diagnosis  Pre-op Diagnosis     * Right carpal tunnel syndrome [G56.01] Post-op Diagnosis     * Right carpal tunnel syndrome [G56.01]     Procedure:    RELEASE, CARPAL TUNNEL  CPT(R) Code:  40814 - PR NEUROPLASTY &/TRANSPOS MEDIAN NRV CARPAL TUNNE    Surgeon(s) and Role:     Floyde Parkins, MD - Primary    Procedure Summary  Anesthesia: * No anesthesia type entered *  ASA: ASA 2  Estimated Blood Loss: None  Total IV Fluids: 400 mL  Drains:  Peripheral IV 03/15/22 Anterior;Left Mid forearm (Active)   Site Assessment Clean;Dry;Intact 03/15/22 0830   Line Status Blood return noted;Flushed;Saline locked 03/15/22 0830   Dressing Status Clean;Dry;Intact 03/15/22 0830   Dressing Intervention New dressing 03/15/22 0830   Reason Not Rotated Not due 03/15/22 0830      Specimens: No specimens collected during this procedure.   Implant(s): Nothing was implanted during the procedure  Staff:   Circulator: Marva Panda, RN  Surgical Tech: Elyn Peers    Indications: Amanda Cervantes is an 56 year old female who is having surgery for Right carpal tunnel syndrome [G56.01].    Procedure Details:  The patient was brought to the operating room and placed in a comfortable supine position on the operating table.  The right upper extremity was comfortably extended on a well-padded hand table.  An initial surgical safety check was led by the anesthesia team.  MAC anesthesia was now provided.  Using sterile technique local field block anesthetic was given to the volar aspect of the wrist using 1% plain lidocaine.  The arm was now prepped and draped sterilely.  A final surgical timeout was performed confirming proper patient, procedure to be performed, and laterality.  Lastly the arm was exsanguinated with an Esmarch dressing and the tourniquet inflated to 250 mmHg.     A  longitudinal incision was made in the palm beginning at the level of the wrist flexion crease extending in line with the radial border of the ring finger.  The incision was carried down through the deep dermal layer.  Potential bleeding points were controlled throughout the course of the dissection with bipolar coagulation.  Sharp dissection continued down through the subcutaneous tissues and fibers of the superficial palmar fascia were incised.  The transverse carpal ligament was now identified.  The structure was now divided longitudinally again in line with the radial border of the ring finger and the canal entered.  On entering the canal hemostat was placed underneath the transverse carpal ligament to protect the contents of the canal.  Under direct vision using sharp dissection the remaining distal fibers of the transverse carpal ligament and superficial palmar fascia fibers were divided.  Attention was now turned proximally.  Again a hemostat was placed underneath the transverse carpal ligament to protect the contents of the canal.  Under direct vision using sharp dissection the proximal fibers of the transverse carpal ligament and superficial palmar fascia fibers were divided.  A grooved tissue protector was placed underneath the distal fibers of the volar forearm fascia.  The structure was now divided longitudinally under direct vision.  A complete release of the canal was assured based on both inspection and palpation.  The canal was carefully explored with blunt dissection and  the findings noted below.     The wound was irrigated with normal saline solution.  Any other potential bleeding points were controlled with bipolar coagulation.  The skin edges were now reapproximated with 4-0 nylon.  Half percent plain Marcaine solution was given along the wound edges for postop pain control.  A bulky hand dressing was then applied and the tourniquet released.  The total tourniquet time was 16  minutes.      Preoperative antibiotics have been ordered and given within 1 hours of incision.   Venous thrombosis prophylaxis have been ordered including unilateral sequential compression device    Findings: Thickening of the transverse carpal ligament.  There is a minor hourglass deformity of the median nerve.  There was moderate thickening of the flexor tenosynovium.  Motor branch and superficial palmar arch were intact.  No masses present within the carpal canal.    Complications:  None; patient tolerated the procedure well.     Disposition: PACU - hemodynamically stable.  Condition: stable    Floyde Parkins, MD  Phone Number: [phone]

## 2022-03-15 NOTE — Discharge Instructions (Addendum)
DISCHARGE INSTRUCTIONS   AFTER WRIST AND HAND SURGERY    Surgical procedure performed today: {HAND AND WRIST SURGICAL PROCEDURE:15131}    Appointments: You will possibly be scheduled for 2 to 3 appointments with your Surgeon or PA.    Occupational therapy: Not everyone will need this. You may be scheduled to start therapy 3 to 4 days after surgery.  Keep this appointment as it will affect the result of your treatment.     1st appointment - With your surgeon or PA. will be about 2 weeks after surgery:  This is to check the incision and remove the stitches.  Depending on your procedure, you may be placed in a cast at that time.    2nd appointment - between 4 and 5 weeks after surgery.      To decrease swelling, rest with your operated arm raised up on pillows with  your hand higher than your heart.    Ice Therapy: Use ice packs to the hand and wrist 20 minutes every hour. No  ice for the rest of that hour. (20 minutes on/ 40 minutes off).    Keep moving your fingers on your operative hand to avoid swelling and  stiffness. Try to make a full fist.    You may remove your bulky bandage 48 hours after surgery. Cover the incision with a large band-ad. Keep the incision clean and dry. If the  bandage feels too tight before then, have someone loosen it up and rewrap it.    It is normal to have some pain and swelling after surgery. It will get better  over time. If you lie down and raise your hand on pillows, it will feel better.     The numbing medicine put into your arm will slowly wear off. Start taking recommended/prescribed pain medications as soon as you start to feel the pain. Take the  medicine as directed. Do NOT take more medicine than you are  supposed to. Take less as you feel better. Stop when you are ready.      Recommended dosing for ibuprofen and tylenol: Ibuprofen (advil, motrin) 600 mg every 6 hours as needed AND acetaminophen (tylenol) 1000 mg every 8 hours as needed.    Diet: Drink extra fluid the first  night. Start you usual diet when you feel like  eating again.    Bathing: Keep dressing and splint dry and out of the shower. Cover with a  plastic bag when you shower.     If you have a fever over 101 F, numbness or tingling that does not get better  each day, drainage that comes through the bandage,  nausea and vomiting  that lasts or pain that does not get better with prescribed medicine, call the  Office.        [facility]  [place], [place] [address]  [phone] [facility]  [place], [place] [address]  [phone] option [facility]  [place], [place] [address]  [phone] [facility]  [address]  [place], [place] [address]  [phone]         After hours for all campuses (5:00 pm-8:30am) please call [phone].      INSTRUES DE DESCARGA  APS CIRURGIA DE PULSO E MO    Procedimento cirrgico realizado hoje: {PROCEDIMENTO CIRRGICO DE MO E PULSO:15131}    Consultas: Possivelmente voc ter 2 a 3  consultas agendadas com seu cirurgio ou PA.    o Terapia ocupacional: Nem todos precisaro disso. Voc pode estar programado para iniciar a terapia 3 a 4 dias aps a cirurgia. Mantenha esta consulta, pois isso afetar o resultado do seu tratamento.    o 1 consulta - Com seu cirurgio ou PA. ser cerca de 2 semanas aps a cirurgia: Isto  para verificar a inciso e retirar os pontos. Dependendo do seu procedimento, voc pode ser engessado naquele momento.    o 2 consulta - entre 4 e 5 semanas aps a [place].    1. Para diminuir o inchao, descanse com o brao operado levantado sobre travesseiros com  sua mo mais alta que seu corao.    2. Terapia com gelo: Use bolsas de gelo na mo e no pulso 20 minutos a cada hora. No  gelo pelo resto daquela hora. (20 minutos ligado/ 40 minutos desligado).    3. Continue movendo os dedos da mo operada para evitar inchao e  rigidez. Tente fechar o punho.    4. Voc pode remover  o curativo volumoso 48 horas aps a cirurgia. Reunion a inciso com uma faixa grande. Mantenha a inciso limpa e seca. Se o  a bandagem parecer muito apertada antes disso, pea a algum para afroux-la e embrulha-la novamente.    5.  normal sentir alguma dor e inchao aps a cirurgia. As coisas vo melhorar  ao longo do tempo. Se voc se deitar e levantar a mo sobre os travesseiros, voc se Warden/ranger.    6. O efeito anestsico colocado em seu brao ir desaparecer lentamente. Comece a tomar analgsicos recomendados/prescritos assim que comear a Sports coach. Levar a  medicamento conforme indicado. NO tome mais remdio do que voc  suposto. Tome menos  medida que se Warden/ranger. Pare quando Press photographer.    Dosagem recomendada para ibuprofeno e tylenol: Ibuprofeno (advil, motrin) 600 mg a cada 6 horas, conforme necessrio E paracetamol (tylenol) 1000 mg a cada 8 horas, conforme necessrio.    7. Dieta: Beba lquidos extras na primeira noite. Comece sua dieta habitual quando sentir vontade  comendo novamente.    8. Banho: Mantenha o curativo e a tala secos e fora do Health visitor. Cubra com um  saco plstico quando voc toma banho.    9. Se voc tiver febre acima de 40C, dormncia ou formigamento que no melhora  a cada dia, drenagem que sai pelo curativo, nuseas e vmitos  que dura ou dor que no melhora com remdio prescrito, ligue para o  Escritrio.        [facility] [place], [place] [address]  [place]) [place], [place], [place] [address]  [address]) [place], [place], [place] [address]  [phone]  [address]  [place], [place] [address]  [phone]        Aps o expediente para todos os campi [phone]), ligue para [phone].

## 2022-03-15 NOTE — Anesthesia Preprocedure Evaluation (Signed)
Pre-Anesthetic Note  .      Patient: Amanda Cervantes is a 56 year old female      Procedure Information       Date/Time: 03/15/22 0925    Procedure: RELEASE, CARPAL TUNNEL (Right: Hand)    Diagnosis: Right carpal tunnel syndrome [G56.01]    Pre-op diagnosis: Right carpal tunnel syndrome [G56.01]    Location: [place] OR 2 / [place] OR    Surgeons: Floyde Parkins, MD            Relevant Problems   No relevant active problems           Previous Anesthetic History:   Past Surgical History:  No date: LAPAROSCOPY FULGURATION OVIDUCTS  No date: OB ANTEPARTUM CARE CESAREAN DLVR & POSTPARTUM  No date: TOE SURGERY; Left      Comment:  5 years ago  No date: TUBAL LIGATION        Medications  Current Facility-Administered Medications   Medication    ceFAZolin (ANCEF) 3 g in sodium chloride 0.9 % 100 mL minibag plus    lactated ringers infusion    lidocaine 1 % injection    bupivacaine PF (MARCAINE PF) 0.5 % injection         Allergies:   Review of Patient's Allergies indicates:   Penicillins             Other (See Comments)    Comment:Took skin 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 & was positive, never             actually took it   Thiazide-type diure*    Other (See Comments)    Comment:Stopped due to low K+    Smoking, Alcohol, Drugs:  Social History    Tobacco Use      Smoking status: Former        Packs/day: 0.33        Years: 10.00        Additional pack years: 0.00        Total pack years: 3.30        Types: Cigarettes        Quit date: 10/15/2012        Years since quitting: 9.4      Smokeless tobacco: Never      Tobacco comments: quit 3 months ago on 10/15/12    Alcohol use: Yes      Comment: minimal      Drug use:   No       PMHx:  Past Medical History:  06/22/2017: Astigmatism of both eyes with presbyopia  No date: Diabetes mellitus ([place])  No date: HTN (hypertension)    Vitals  Ht _0  (1.575 m)   Wt 82.1 kg (181 lb)   LMP 06/27/2019 (Approximate)   BMI 33.11 kg/m     Review of Systems     Patient summary reviewed      Anesthetic History:    negative anesthesia history ROS           Cardiovascular:  Positive for hypercholesterolemia and hypertension.   Physical Activity in METs 4    Pulmonary: Positive for tobacco use.   GU/Renal: Negative for GU/renal diseases.    Hepatic: Skin negative for hepatic disease.    Neurological: Negative for neurological diseases.    Gastrointestinal:  Negative for GERD.   Endocrine: Positive for Diabetes.   Musculoskeletal:  Positive for arthralgias.   Psychiatric: Negative for psychiatry diseases.     Constitutional: Negative for  constitutional diseases.    Skin: Negative for skin diseases.        Physical Exam    General     Level of consciousness:  Alert   BMI   BMI greater than 30 kg/m2   Airway     Mallampati:  II    TM distance:  >3 FB    Mouth opening:  >3 FB    Neck ROM:  Full   Teeth      }   Heart   - normal exam     Lungs - normal exam                   Pertinent Labs:   Lab Results   Component Value Date    NA 140 12/08/2021    K 4.2 12/08/2021    CREAT 0.8 12/08/2021    GLUCOSER 89 12/08/2021    WBC 10.5 11/21/2015    HCT 36.4 11/21/2015    PLTA 174 11/21/2015         Anesthesia Plan    ASA Score:     ASA:  2    Airway:      Mallampati:  II    Mouth opening:  >3 FB    Neck ROM:  Full    TM distance:  >3 FB     Plan: MAC    Other information:     EKG Reviewed: : Yes      Full Stomach Precaution:: No      Post-Plan::  PACU    Anesthesia Assessment and Plan:       Plan discussed with attending anesthesiologist:     Jobe Igo, MD  Informed Consent:     Anesthetic plan and risks discussed with:  Patient   Patient Consented        Attending Anesthesiologist Statement:     Reassessed day of surgery: Yes        Assessment made, necessary equipment and appropriate plan in place.

## 2022-03-15 NOTE — Anesthesia Postprocedure Evaluation (Signed)
Anesthesia Post-Operative Evaluation Note    Patient: Amanda Cervantes           Procedure Summary       Date: 03/15/22 Room / Location: [facility] 2 / [facility]    Anesthesia Start: 0919 Anesthesia Stop:     Procedure: RELEASE, CARPAL TUNNEL (Right: Hand) Diagnosis:       Right carpal tunnel syndrome      (Right carpal tunnel syndrome [G56.01])    Surgeons: Floyde Parkins, MD Responsible Provider: Jobe Igo, MD    Anesthesia Type: MAC ASA Status: 2              POST-OPERATIVE EVALUATION    Anesthesia Post Evaluation    Vitals signs in patient's normal range: Yes  Respiratory function stable; airway patent: Yes  Cardiovascular function stable: Yes  Hydration status stable: Yes  Mental status recovered; patient participates in evaluation and/or is at baseline: Yes  Pain control satisfactory: Yes  Nausea and vomiting control satisfactory: YesProcedure was labor & delivery no  PostOP disposition PACU  Anesthesia Observation no significant observation    MIPS#404 Anesthesiology Smoking Abstinence:     The patient is a current smoker (e.g. cigarette, cigar, pipe, e-cigarette/vaping/marijuana) (J6283): Yes   FOR CODING USE ONLY: IF BLANK X0404    MIPS#477 Multimodal Pain Management:  Emergent - Exclusion case: No  FOR CODING USE ONLY: REASON NOT LISTED T5176    HYWV#371 Perioperative Temperature Management:  Anesthesia start to Anesthesia end time was 60 minutes or longer (4256F): No   FOR CODING USE ONLY: REASON NOT LISTED G6269    MIPS#430 Adult Prevention of PONV:  Patient received an inhalational anesthetic (SW546): No  FOR CODING USE ONLY: REASON NOT LISTED E7035    KKXF#818 Pediatric Prevention of PONV:  Pediatric patient?: No  FOR CODING USE ONLY: REASON NOT LISTED E9937        [employer] # [id number]        Last vitals    BP: 158/89 (03/15/2022  8:07 AM)  Temp: 97.9 F (36.6 C) (03/15/2022  8:07 AM)  Pulse: 63 (03/15/2022  8:07 AM)  Resp: 20 (03/15/2022  8:07 AM)  SpO2: 98 % (03/15/2022  8:07 AM)

## 2022-03-15 NOTE — Interval H&P Note (Signed)
Patient Assessment Update: (Fill out Prior to procedure or within 24 hours of  admission if H&P done pre-admission.)   Re-evaluation including history review and physical examination has been performed.    Patient's Condition No Change    Amanda Parkins, MD, 03/15/2022

## 2022-03-15 NOTE — Brief Op Note (Signed)
Brief Procedure or Operative Note    Procedure:  Procedure(s):  RELEASE, CARPAL TUNNEL    Pre-Procedure Diagnosis: Pre-op Diagnosis     * Right carpal tunnel syndrome [G56.01]     Post-Procedure Diagnosis: Post-op Diagnosis     * Right carpal tunnel syndrome [G56.01]    Surgeon: Primary: Floyde Parkins, MD    Assistant (if applicable):     Type of Anesthesia:  * No anesthesia type entered *    Findings:   Thickening of the transverse carpal ligament.  There is a minor hourglass deformity of the median nerve.  There was moderate thickening of the flexor tenosynovium.  Motor branch and superficial palmar arch were intact.  No masses present within the carpal canal.    Estimated Blood Loss: 0 mL    Specimens Removed: No specimens were documented in this log.    Complications:  None    Other (e.g. Implants):  Nothing was implanted during the procedure   Peripheral IV 03/15/22 Anterior;Left Mid forearm (Active)   Site Assessment Clean;Dry;Intact 03/15/22 0830   Line Status Blood return noted;Flushed;Saline locked 03/15/22 0830   Dressing Status Clean;Dry;Intact 03/15/22 0830   Dressing Intervention New dressing 03/15/22 0830   Reason Not Rotated Not due 03/15/22 0830         Floyde Parkins, MD  Pager :                   .

## 2022-03-15 NOTE — H&P (Signed)
Orthopedic Surgery H+P    ORTHOPEDIC PROBLEM LIST:  1. Bilateral moderate to severe carpal tunnel syndrome, R>L  2. Bilateral hand numbness and tingling  3. Bilateral wrist pain     HPI: Amanda Cervantes is a 56year old RHD, [place] speaking female who presents today for preoperative H&P for a planned right carpal tunnel release with Dr. Adelene Amas 03/15/22. A [place] telephone interpreter was used for today's encounter. She denies any recent injuries or changes in her medical history. She is wearing her brace at night and her symptoms unchanged. She reports pain and numbness to her entire hand and fingers. She would have pain when holding up a fork to eat. 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     Past Medical History:  06/22/2017: Astigmatism of both eyes with presbyopia  No date: Diabetes mellitus ([place])  No date: HTN (hypertension)    Past Surgical History:  No date: LAPAROSCOPY FULGURATION OVIDUCTS  No date: OB ANTEPARTUM CARE CESAREAN DLVR & POSTPARTUM  No date: TOE SURGERY; Left      Comment:  5 years ago  No date: TUBAL LIGATION    Review of Patient's Allergies indicates:   Penicillins             Other (See Comments)    Comment:Took skin 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 & was positive, never             actually took it   Thiazide-type diure*    Other (See Comments)    Comment:Stopped due to low K+    ROS  Pertinent positives in HPI     PHYSICAL EXAM:  GENERAL: Alert and oriented, in no acute distress  HEENT: Normocephalic and atraumatic. PERRLA  LUNGS: Non-labored breathing and speaking in complete sentences.  CARDIAC: RRR  MUSCULOSKELETAL: Evaluation of bilateral hands shows no gross bony abnormalities. Mild thenar wasting, worse on the right than on the left. No additional overlying skin changes; skin is warm, dry, intact. NTTP throughout. Full ROM of the wrists b/l. Readily able to flex and extend all fingers with and without resistance without deficit. - Tinel's bilaterally. + Durkin's, Phalen's b/l.   Negative Tinel's at the cubital tunnel bilaterally.  Deep and superficial sensation intact and equal bilaterally.  2+ cap refill in all fingers, 2+ radial pulses bilaterally.     EMG: EMG performed by Dr. 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  Evidence for partial motor conduction block bilaterally, worse on the right.  Evidence suggestive of an ulnar neuropathy on the right though the site of involvement was not localized.  Presence of right C8 radiculopathy cannot be excluded.  2+ right first dorsal interosseous needle changes; 1+ right abductor pollicis brevis needle change.  1+ left abductor pollicis brevis needle change.    ASSESSMENT/PLAN: 56 year old female presenting today for preoperative H&P for a planned right carpal tunnel release with Dr. Adelene Amas 03/15/22. She will be going home the same day. She had her PAT visit 02/21/22. She has been NPO since before midnight. CHG wipes with directions was given to patient and she used them as instructed. She stopped any aspirin or NSAIDs 5 days prior to surgery. She is also planned to have a parathyroidectomy with Dr. Orlin Hilding 03/23/22 and a left carpal tunnel release with Dr. Adelene Amas 04/12/22. All of her questions and concerns were addressed today.    Patient was seen in independently by the PA.  Leanord Hawking, PA-C, 03/15/2022

## 2022-03-15 NOTE — H&P (View-Only) (Signed)
Orthopedic Surgery H+P    ORTHOPEDIC PROBLEM LIST:  1. Bilateral moderate to severe carpal tunnel syndrome, R>L  2. Bilateral hand numbness and tingling  3. Bilateral wrist pain     HPI: Amanda Cervantes is a 56year old RHD, [place] speaking female who presents today for preoperative H&P for a planned right carpal tunnel release with Dr. Adelene Amas 03/15/22. A [place] telephone interpreter was used for today's encounter. She denies any recent injuries or changes in her medical history. She is wearing her brace at night and her symptoms unchanged. She reports pain and numbness to her entire hand and fingers. She would have pain when holding up a fork to eat. 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     Past Medical History:  06/22/2017: Astigmatism of both eyes with presbyopia  No date: Diabetes mellitus ([place])  No date: HTN (hypertension)    Past Surgical History:  No date: LAPAROSCOPY FULGURATION OVIDUCTS  No date: OB ANTEPARTUM CARE CESAREAN DLVR & POSTPARTUM  No date: TOE SURGERY; Left      Comment:  5 years ago  No date: TUBAL LIGATION    Review of Patient's Allergies indicates:   Penicillins             Other (See Comments)    Comment:Took skin 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 & was positive, never             actually took it   Thiazide-type diure*    Other (See Comments)    Comment:Stopped due to low K+    ROS  Pertinent positives in HPI     PHYSICAL EXAM:  GENERAL: Alert and oriented, in no acute distress  HEENT: Normocephalic and atraumatic. PERRLA  LUNGS: Non-labored breathing and speaking in complete sentences.  CARDIAC: RRR  MUSCULOSKELETAL: Evaluation of bilateral hands shows no gross bony abnormalities. Mild thenar wasting, worse on the right than on the left. No additional overlying skin changes; skin is warm, dry, intact. NTTP throughout. Full ROM of the wrists b/l. Readily able to flex and extend all fingers with and without resistance without deficit. - Tinel's bilaterally. + Durkin's, Phalen's b/l.   Negative Tinel's at the cubital tunnel bilaterally.  Deep and superficial sensation intact and equal bilaterally.  2+ cap refill in all fingers, 2+ radial pulses bilaterally.     EMG: EMG performed by Dr. 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  Evidence for partial motor conduction block bilaterally, worse on the right.  Evidence suggestive of an ulnar neuropathy on the right though the site of involvement was not localized.  Presence of right C8 radiculopathy cannot be excluded.  2+ right first dorsal interosseous needle changes; 1+ right abductor pollicis brevis needle change.  1+ left abductor pollicis brevis needle change.    ASSESSMENT/PLAN: 56 year old female presenting today for preoperative H&P for a planned right carpal tunnel release with Dr. Adelene Amas 03/15/22. She will be going home the same day. She had her PAT visit 02/21/22. She has been NPO since before midnight. CHG wipes with directions was given to patient and she used them as instructed. She stopped any aspirin or NSAIDs 5 days prior to surgery. She is also planned to have a parathyroidectomy with Dr. Orlin Hilding 03/23/22 and a left carpal tunnel release with Dr. Adelene Amas 04/12/22. All of her questions and concerns were addressed today.    Patient was seen in independently by the PA.  Leanord Hawking, PA-C, 03/15/2022

## 2022-03-16 ENCOUNTER — Ambulatory Visit
Admission: RE | Admit: 2022-03-16 | Discharge: 2022-03-16 | Disposition: A | Discharge status: Home / Self Care | Payer: No Typology Code available for payment source

## 2022-03-16 ENCOUNTER — Other Ambulatory Visit: Payer: Self-pay

## 2022-03-16 ENCOUNTER — Encounter (HOSPITAL_BASED_OUTPATIENT_CLINIC_OR_DEPARTMENT_OTHER): Payer: Self-pay

## 2022-03-16 NOTE — Anesthesia Preprocedure Evaluation (Addendum)
Pre-Anesthetic Note  .      Patient: Amanda Cervantes is a 56 year old female      Procedure Information       Date/Time: 03/23/22 1025    Procedure: PARATHYROIDECTOMY (Bilateral)    Diagnosis: Primary hyperparathyroidism ([place]) [E21.0]    Pre-op diagnosis: Primary hyperparathyroidism ([place]) [E21.0]    Location: [facility] 06 / [facility]    Surgeons: Corena Pilgrim, MD            Relevant Problems   NEURO/PSYCH   (+) Headache disorder      CARDIO   (+) Essential hypertension, benign      ENDO   (+) Type 2 diabetes mellitus ([place])           Previous Anesthetic History:   Past Surgical History:  No date: LAPAROSCOPY FULGURATION OVIDUCTS  No date: OB ANTEPARTUM CARE CESAREAN DLVR & POSTPARTUM  No date: TOE SURGERY; Left      Comment:  5 years ago  No date: TUBAL LIGATION        Medications  Current Outpatient Medications   Medication Sig    ibuprofen (ADVIL) 600 MG tablet Take 1 tablet by mouth every 6 (six) hours as needed  for up to 7 days    acetaminophen (TYLENOL) 500 MG tablet Take 2 tablets by mouth every 8 (eight) hours as needed for Pain  for up to 7 days    fluticasone (FLONASE) 50 MCG/ACT nasal spray 1 spray by Each Nostril route in the morning.    metoprolol (TOPROL-XL) 50 MG 24 hr tablet Take 1 tablet by mouth in the morning.    amLODIPine (NORVASC) 10 MG tablet Take 1 tablet by mouth in the morning.    D3-1000 25 MCG (1000 UT) tablet TAKE 2 TABLETS BY MOUTH DAILY    atorvastatin (LIPITOR) 10 MG tablet Take 1 tablet by mouth nightly    TRULICITY 1.5 YW/7.3XT injection INJECT 1.5 MG UNDER THE SKIN ONCE A WEEK    metFORMIN (GLUCOPHAGE-XR) 750 MG 24 hr tablet TAKE 2 TABLETS BY MOUTH NIGHTLY    lisinopril (ZESTRIL) 40 MG tablet TAKE 1 TABLET BY MOUTH DAILY     No current facility-administered medications for this encounter.         Allergies:   Review of Patient's Allergies indicates:   Penicillins             Other (See Comments)    Comment:Took skin 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 & was positive, never             actually took it    Thiazide-type diure*    Other (See Comments)    Comment:Stopped due to low K+    Smoking, Alcohol, Drugs:  Social History    Tobacco Use      Smoking status: Former        Packs/day: 0.33        Years: 10.00        Additional pack years: 0.00        Total pack years: 3.30        Types: Cigarettes        Quit date: 10/15/2012        Years since quitting: 9.4      Smokeless tobacco: Never      Tobacco comments: quit 3 months ago on 10/15/12    Alcohol use: Yes      Comment: minimal      Drug use:   No  PMHx:  Past Medical History:  06/22/2017: Astigmatism of both eyes with presbyopia  No date: Diabetes mellitus ([place])  No date: HTN (hypertension)    Vitals  Ht 5' 2.5" (1.588 m)   Wt 82.6 kg (182 lb)   LMP 06/27/2019 (Approximate)   BMI 32.76 kg/m     Review of Systems     Patient summary reviewed and Nursing notes reviewed          Cardiovascular:  Positive for hypertension.   Physical Activity in METs 4    Pulmonary: Positive for obstructive sleep apnea.   GU/Renal: Negative for GU/renal diseases.    Hepatic: Skin negative for hepatic disease.    Neurological: Negative for neurological diseases.    Gastrointestinal: Negative for gastrointestinal diseases.    Endocrine: Positive for Diabetes.       Physical Exam    General     Level of consciousness:  Alert   BMI   BMI greater than 30 kg/m2   Airway     Mallampati:  II    TM distance:  >3 FB    Mouth opening:  >3 FB    Neck ROM:  Full   Teeth  - normal exam    }   Heart   - normal exam     Lungs - normal exam                   Pertinent Labs:   Lab Results   Component Value Date    NA 140 12/08/2021    K 4.2 12/08/2021    CREAT 0.8 12/08/2021    GLUCOSER 89 12/08/2021    WBC 10.5 11/21/2015    HCT 36.4 11/21/2015    PLTA 174 11/21/2015         Anesthesia Plan    ASA Score:     ASA:  2    Airway:      Mallampati:  II    Mouth opening:  >3 FB    Neck ROM:  Full    TM distance:  >3 FB     Plan: general    Other information:     EKG Reviewed: : Yes      Full  Stomach Precaution:: No      Post-Plan::  PACU    Anesthesia Assessment and Plan:        Phone call placed to patient to take Trulicity on Friday 18/29/93 so that there would be 7 day gap before surgery on Friday 03/23/22.   Interpreter [phone].  This pt is having 3 procedures in 1 month. Trulicity hard to schedule but it is now planned for 1/6, 1/12 and 1/18 and PCP agrees.        Informed Consent:     Anesthetic plan and risks discussed with:  Patient   Patient Consented        Attending Anesthesiologist Statement:     Reassessed day of surgery: Yes        Assessment made, necessary equipment and appropriate plan in place.

## 2022-03-16 NOTE — Discharge Instructions (Addendum)
INSTRUES DO PR-OPERATRIO PARA O SURGICAL DAY CARE   SURGICAL DAY CARE PRE-OPERATIVE INSTRUCTIONS    DIA DA [place] em:            [place] (Friday), [place] (January) 5 s (Time): 8:30 am . ( Thyroid )  Arrive at:  Registration on (Day of the Week, Month, Day, at (Time).                    [facility]  January 25 - Thursday at 6:30 am   (carpal tunnel)         The day before surgery our Office will call with updated arrival time.    From now until after surgery do not take any Aspirin, or Ibuprofen which includes - Naproxen,Advil ,Motrin, and Alleve. You may take Tylenol.     Take trulicity on January 6th , 12th , 18th      obrigatrio ter um adulto responsvel disponvel para acompanh-lo at [employer] a [place]. (Sugerimos que voc tenha algum disponvel para ajud-lo em casa aps a sua cirurgia).  You must have a responsible adult available to accompany you home after surgery. (We suggest that you have someone available to assist you at home after your surgery).    INSTRUES:   INSTRUCTIONS:  Voc no poder comer ou beber nada aps a meia-noite da noite anterior  cirurgia, nem mesmo gua, goma de mascar ou balas.   You may have nothing to eat or drink after midnight the night before your surgery, not even water, gum or hard candy.    Voc no poder fumar na manh do dia da sua cirurgia.  You may not smoke the morning of your surgery.    Por favor, deixe em casa todos os objetos de valor, incluindo joias, relgios, dinheiro, etc.  Please leave all valuables at home, including jewelry, watches, money, etc.    Remova qualquer esmalte das unhas antes de chegar ao Surgical Day Care e no use qualquer maquiagem ou batom.  Please remove all fingernail polish before arriving at [facility] Day Care and do not  wear any face or lip make-up.    Se for fazer cirurgia ocular, no aplique maquiagem nos olhos ou no rosto e evite hidratantes faciais e  perfumes.  If you are having eye surgery, do not wear any eye or face makeup and avoid facial moisturizers and perfumes.    Por favor, retire quaisquer extenses de cabelo que possam ser [employer].  Please take out any hair extensions that can be removed.    No raspe os pelos da rea da cirurgia.  Do not shave surgical site.    No use lentes de contato.  Do not wear contact lenses.    MEDICAMENTOS:   MEDICATIONS:     Tome a medicao a seguir na noite anterior  cirurgia, na hora de dormir: usual medicines No trulicity   Take the following medication the night before surgery at bedtime:     [place] a seguir na manh do dia da sua cirurgia, com apenas um gole de gua: amlodipine atorvastatin  metoprolol   Take the following medication the morning of your surgery with only a sip of water:

## 2022-03-16 NOTE — Surgery Post-Op (Signed)
Procedure: Procedure(s):  RELEASE, CARPAL TUNNEL      Pre-Procedure Diagnosis: Pre-op Diagnosis     * Right carpal tunnel syndrome [G56.01]     Post-Procedure Diagnosis: Post-op Diagnosis     * Right carpal tunnel syndrome [G56.01]    Surgeon: Primary: Floyde Parkins, MD    Assistant (if applicable):     Type of Anesthesia:  * No anesthesia type entered *      POST OP CALL:        Interpreter used?:  Yes    Name/Number:  [id number]    Able to speak with Patient:  Yes    Did you get your discharge instructions?: Yes      Do you have any questions about the instructions?: No      Are you in Pain: No      no fever, , no drainage from dressing and no trouble urinating      All normal      Tolerating Solid Foods: Yes      Overall Surgical Experience Score (from admission to discharge):  [place] Message:  [place], RN

## 2022-03-16 NOTE — Surgery Pre-Op (Signed)
PAT Visit          Amanda Cervantes is a 56 year old female received a preoperative screening.        Appointments for Next 30 Days 03/16/2022 - 04/15/2022        Date Visit Type Department Provider     03/28/2022 10:00 AM PRE-ADMISSION [place] [place]    Patient Instructions:                  04/05/2022  3:15 PM OFF 15 F/U [facility] Corena Pilgrim, MD    Patient Instructions:                  04/06/2022  3:30 PM RX CONSULT [address], [place]    Patient Instructions:     Your visit is scheduled with our clinical pharmacist, who is an expert in managing medications. Please bring the following things to your scheduled appointment: your medication bottles, blood pressure monitor, blood sugar monitor, and logs for blood sugar/pressure, if you have them. Please arrive 15 minutes early to check-in.                      Surgery Information       Future Procedures (Tomorrow to 03/16/2023)         Date Time Visit Type/Procedure Providers Loc / Dept Status        03/23/2022 1025 PARATHYROIDECTOMY - Bilateral Corena Pilgrim [facility] OR Scheduled      Visit Type/Procedure: PARATHYROIDECTOMY - Bilateral       04/12/2022 0815 RELEASE, CARPAL TUNNEL - Left Floyde Parkins [facility] OR Scheduled      Visit Type/Procedure: RELEASE, CARPAL TUNNEL - Left                                    .     Language of care:Portuguese ([place])    Allergy History  Penicillins and Thiazide-Type Diuretics      Past Medical History:  06/22/2017: Astigmatism of both eyes with presbyopia  No date: Diabetes mellitus ([place])  No date: HTN (hypertension)     has a past surgical history that includes Tubal ligation; OB ANTEPARTUM CARE C DLVR&POSTPARTUM; LAPS FULG OVIDUXS; Toe Surgery (Left); and CARPAL TUNNEL RELEASE (Right).         Alcohol, Tobacco and Drug History:  Social History    Tobacco Use       Smoking status: Former        Packs/day: 0.33        Years: 10.00        Additional pack years: 0.00        Total pack years: 3.30        Types: Cigarettes        Quit date: 10/15/2012        Years since quitting: 9.4      Smokeless tobacco: Never      Tobacco comments: quit 3 months ago on 10/15/12    E-Cigarette/Vaping  E-Cigarette Use: Never User  Quit Date:   Nicotine:   Start Date:   THC:    Cartridges/Day:   CBD:   Other Substance:   Flavoring:       Alcohol use   Yes  Comment:   minimal          Drug use:   No       Extended Emergency Contact Information  Primary Emergency Contact: [place]  Address: [address] [place], [place] [address] [place] Phone: [phone]  Mobile Phone: [phone]  Relation: Daughter  Secondary Emergency Contact: Livingston Diones  Address: [place], [place] [address] [place] Phone: [phone]  Mobile Phone: [phone]  Relation: Daughter        Current Outpatient Medications   Medication Sig    ibuprofen (ADVIL) 600 MG tablet Take 1 tablet by mouth every 6 (six) hours as needed  for up to 7 days    acetaminophen (TYLENOL) 500 MG tablet Take 2 tablets by mouth every 8 (eight) hours as needed for Pain  for up to 7 days    metoprolol (TOPROL-XL) 50 MG 24 hr tablet Take 1 tablet by mouth in the morning.    amLODIPine (NORVASC) 10 MG tablet Take 1 tablet by mouth in the morning.    D3-1000 25 MCG (1000 UT) tablet TAKE 2 TABLETS BY MOUTH DAILY    atorvastatin (LIPITOR) 10 MG tablet Take 1 tablet by mouth nightly    TRULICITY 1.5 YS/0.6TK injection INJECT 1.5 MG UNDER THE SKIN ONCE A WEEK    metFORMIN (GLUCOPHAGE-XR) 750 MG 24 hr tablet TAKE 2 TABLETS BY MOUTH NIGHTLY    lisinopril (ZESTRIL) 40 MG tablet TAKE 1 TABLET BY MOUTH DAILY     No current facility-administered medications for this encounter.         (Not in a hospital admission)      PAT Assessment        Airways WDL     Activity tolerance (How many  flights of stairs): no sob or cp wih stairs         Assistive Device: None                                  PAT Screening     Row Name 03/16/22 1142       Integumentary - Complete full head to toe skin assessment    Integumentary (WDL) WDL    Row Name 03/16/22 1143       STOP-Bang Questionaire     Do you snore loudly (loud enough to be heard through closed doors or   your bed-partner elbows you for snoring at night? 1    Do you often feel Tired, fatigued, or Sleepy during the daytime (such as   falling asleep when driving)? 0    Has anyone observed you stop breathing, or choking/gasping during your   sleep? 1    Do you have, or are being treated for, High Blood Pressure? 1    Height '5\' 2"'$  (1.575 m)    Weight 82.1 kg (181 lb)    BMI (Calculated) 33.17    Neck size large? 0    Stop-Bang Score High    Row Name 03/16/22 1142       STOP-Bang Questionaire     Do you snore loudly (loud enough to be heard through closed doors or   your bed-partner elbows you for snoring at night? 0    Do you often feel Tired, fatigued, or Sleepy  during the daytime (such as   falling asleep when driving)? 0    Has anyone observed you stop breathing, or choking/gasping during your   sleep? 0    Do you have, or are being treated for, High Blood Pressure? 0    Height '5\' 2"'$  (1.575 m)    Weight 82.1 kg (181 lb)    BMI (Calculated) 33.17    Neck size large? 0    Stop-Bang Score Low                       03/16/22  1142 03/16/22  1143   Weight: 82.1 kg (181 lb) 82.1 kg (181 lb)   Height: '5\' 2"'$  (1.575 m) '5\' 2"'$  (1.575 m)           Patient issues currently are         Phone screen interview completed with patient, all meds and pre op instructions reviewed with expressed understanding.       Phone screen done for 1/5 surgery at [facility]  and 1/25 at [place] , [employer] given to patient.    Donalda Ewings RN.

## 2022-03-23 ENCOUNTER — Ambulatory Visit (HOSPITAL_BASED_OUTPATIENT_CLINIC_OR_DEPARTMENT_OTHER): Payer: No Typology Code available for payment source | Admitting: Anesthesiology

## 2022-03-23 ENCOUNTER — Encounter (HOSPITAL_BASED_OUTPATIENT_CLINIC_OR_DEPARTMENT_OTHER): Payer: Self-pay | Admitting: Surgery

## 2022-03-23 ENCOUNTER — Other Ambulatory Visit (HOSPITAL_BASED_OUTPATIENT_CLINIC_OR_DEPARTMENT_OTHER): Payer: Self-pay | Admitting: Physician Assistant

## 2022-03-23 ENCOUNTER — Ambulatory Visit
Admission: RE | Admit: 2022-03-23 | Discharge: 2022-03-23 | Disposition: A | Discharge status: Home / Self Care | Payer: No Typology Code available for payment source | Attending: Surgery | Admitting: Surgery

## 2022-03-23 ENCOUNTER — Encounter (HOSPITAL_BASED_OUTPATIENT_CLINIC_OR_DEPARTMENT_OTHER)
Admission: RE | Disposition: A | Discharge status: Home / Self Care | Payer: Self-pay | Source: Ambulatory Visit | Attending: Surgery

## 2022-03-23 ENCOUNTER — Other Ambulatory Visit: Payer: Self-pay

## 2022-03-23 DIAGNOSIS — Z7984 Long term (current) use of oral hypoglycemic drugs: Secondary | ICD-10-CM | POA: Diagnosis not present

## 2022-03-23 DIAGNOSIS — Z7985 Long-term (current) use of injectable non-insulin antidiabetic drugs: Secondary | ICD-10-CM | POA: Insufficient documentation

## 2022-03-23 DIAGNOSIS — Z79899 Other long term (current) drug therapy: Secondary | ICD-10-CM | POA: Insufficient documentation

## 2022-03-23 DIAGNOSIS — Z87891 Personal history of nicotine dependence: Secondary | ICD-10-CM | POA: Diagnosis not present

## 2022-03-23 DIAGNOSIS — E210 Primary hyperparathyroidism: Secondary | ICD-10-CM | POA: Diagnosis not present

## 2022-03-23 DIAGNOSIS — Z9089 Acquired absence of other organs: Secondary | ICD-10-CM

## 2022-03-23 DIAGNOSIS — E892 Postprocedural hypoparathyroidism: Secondary | ICD-10-CM

## 2022-03-23 DIAGNOSIS — I10 Essential (primary) hypertension: Secondary | ICD-10-CM | POA: Insufficient documentation

## 2022-03-23 DIAGNOSIS — G4733 Obstructive sleep apnea (adult) (pediatric): Secondary | ICD-10-CM | POA: Insufficient documentation

## 2022-03-23 DIAGNOSIS — E119 Type 2 diabetes mellitus without complications: Secondary | ICD-10-CM | POA: Insufficient documentation

## 2022-03-23 LAB — INTRA-PROCEDURAL PATHOLOGY CONSULT

## 2022-03-23 LAB — BLOOD SUGAR FINGERSTICK (POINT OF CARE)
FINGERSTICK GLUCOSE: 104 mg/dl (ref 74–160)
FINGERSTICK GLUCOSE: 139 mg/dl (ref 74–160)

## 2022-03-23 LAB — PTH INTACT WITH CALCIUM
PTH INTACT CALCIUM: 10.7 mg/dL — ABNORMAL HIGH (ref 8.5–10.5)
PTH INTACT: 55 pg/mL (ref 15–65)

## 2022-03-23 LAB — CALCIUM IONIZED: CALCIUM IONIZED: 6.1 mg/dL — ABNORMAL HIGH (ref 4.5–5.3)

## 2022-03-23 LAB — INTRAOPERATIVE INTACT PTH: INTRAOPERATIVE INTACT PTH: 22 pg/mL (ref 15–65)

## 2022-03-23 SURGERY — PARATHYROIDECTOMY
Anesthesia: General | Site: Neck | Laterality: Bilateral | Wound class: Class I/ Clean

## 2022-03-23 MED ORDER — OXYCODONE-ACETAMINOPHEN 5-325 MG PO TABS
1.0000 | ORAL_TABLET | ORAL | Status: AC | PRN
Start: 2022-03-23 — End: 2022-03-23
  Administered 2022-03-23 (×2): 1 via ORAL
  Filled 2022-03-23 (×2): qty 1

## 2022-03-23 MED ORDER — FENTANYL CITRATE 0.05 MG/ML IJ SOLN
INTRAMUSCULAR | Status: AC
Start: 2022-03-23 — End: 2022-03-23
  Filled 2022-03-23: qty 2

## 2022-03-23 MED ORDER — EPHEDRINE SULFATE 50 MG/ML IJ SOLN (SUPER ERX)
Freq: Once | INTRAMUSCULAR | Status: DC | PRN
Start: 2022-03-23 — End: 2022-03-23
  Administered 2022-03-23: 25 mg via INTRAVENOUS

## 2022-03-23 MED ORDER — DIPHENHYDRAMINE HCL 50 MG/ML IJ SOLN
INTRAMUSCULAR | Status: AC
Start: 2022-03-23 — End: 2022-03-23
  Filled 2022-03-23: qty 1

## 2022-03-23 MED ORDER — EPHEDRINE SULFATE 50 MG/ML IJ SOLN (SUPER ERX)
INTRAMUSCULAR | Status: AC
Start: 2022-03-23 — End: 2022-03-23
  Filled 2022-03-23: qty 1

## 2022-03-23 MED ORDER — LIDOCAINE HCL (PF) 2 % IJ SOLN
INTRAMUSCULAR | Status: AC
Start: 2022-03-23 — End: 2022-03-23
  Filled 2022-03-23: qty 5

## 2022-03-23 MED ORDER — HYDROMORPHONE HCL 2 MG/ML IJ SOLN (SUPER ERX)
Status: AC
Start: 2022-03-23 — End: 2022-03-23
  Filled 2022-03-23: qty 1

## 2022-03-23 MED ORDER — IBUPROFEN 600 MG PO TABS
600.0000 mg | ORAL_TABLET | Freq: Four times a day (QID) | ORAL | 0 refills | Status: AC | PRN
Start: 2022-03-23 — End: 2022-03-28

## 2022-03-23 MED ORDER — ONDANSETRON HCL 4 MG/2ML IJ SOLN
4.0000 mg | Freq: Once | INTRAMUSCULAR | Status: DC | PRN
Start: 2022-03-23 — End: 2022-03-23

## 2022-03-23 MED ORDER — SUCCINYLCHOLINE CHLORIDE 200 MG/10ML IV SOSY
PREFILLED_SYRINGE | INTRAVENOUS | Status: AC
Start: 2022-03-23 — End: 2022-03-23
  Filled 2022-03-23: qty 10

## 2022-03-23 MED ORDER — SODIUM CHLORIDE 0.9 % IV SOLN
INTRAVENOUS | Status: DC | PRN
Start: 2022-03-23 — End: 2022-03-23
  Administered 2022-03-23: .1 ug/kg/min via INTRAVENOUS
  Administered 2022-03-23: .2 ug/kg/min via INTRAVENOUS
  Administered 2022-03-23: .1 ug/kg/min via INTRAVENOUS

## 2022-03-23 MED ORDER — ONDANSETRON HCL 4 MG/2ML IJ SOLN
Freq: Once | INTRAMUSCULAR | Status: DC | PRN
Start: 2022-03-23 — End: 2022-03-23
  Administered 2022-03-23: 4 mg via INTRAVENOUS

## 2022-03-23 MED ORDER — CALCIUM CARBONATE 1250 (500 CA) MG PO TABS
1.0000 | ORAL_TABLET | Freq: Four times a day (QID) | ORAL | 0 refills | Status: DC
Start: 2022-03-23 — End: 2022-03-23

## 2022-03-23 MED ORDER — DIPHENHYDRAMINE HCL 50 MG/ML IJ SOLN
Freq: Once | INTRAMUSCULAR | Status: DC | PRN
Start: 2022-03-23 — End: 2022-03-23
  Administered 2022-03-23: 12.5 mg via INTRAVENOUS

## 2022-03-23 MED ORDER — ACETAMINOPHEN 500 MG PO TABS
1000.0000 mg | ORAL_TABLET | Freq: Four times a day (QID) | ORAL | 0 refills | Status: AC | PRN
Start: 2022-03-23 — End: 2022-03-30

## 2022-03-23 MED ORDER — REMIFENTANIL HCL 1 MG IV SOLR
INTRAVENOUS | Status: AC
Start: 2022-03-23 — End: 2022-03-23
  Filled 2022-03-23: qty 2000

## 2022-03-23 MED ORDER — PROPOFOL 200 MG/20 ML IV - AN
Freq: Once | INTRAVENOUS | Status: DC | PRN
Start: 2022-03-23 — End: 2022-03-23
  Administered 2022-03-23: 150 mg via INTRAVENOUS

## 2022-03-23 MED ORDER — CALCITRIOL 0.25 MCG PO CAPS
0.2500 ug | ORAL_CAPSULE | Freq: Every day | ORAL | 0 refills | Status: DC
Start: 2022-03-23 — End: 2022-03-23

## 2022-03-23 MED ORDER — BUPIVACAINE HCL (PF) 0.5 % IJ SOLN
INTRAMUSCULAR | Status: AC
Start: 2022-03-23 — End: 2022-03-23
  Administered 2022-03-23: 9 mL
  Filled 2022-03-23: qty 30

## 2022-03-23 MED ORDER — LIDOCAINE HCL (PF) 2 % IJ SOLN
Freq: Once | INTRAMUSCULAR | Status: DC | PRN
Start: 2022-03-23 — End: 2022-03-23
  Administered 2022-03-23: 3 mL via INTRAVENOUS

## 2022-03-23 MED ORDER — ONDANSETRON HCL 4 MG/2ML IJ SOLN
INTRAMUSCULAR | Status: AC
Start: 2022-03-23 — End: 2022-03-23
  Filled 2022-03-23: qty 2

## 2022-03-23 MED ORDER — FENTANYL CITRATE 0.05 MG/ML IJ SOLN
Freq: Once | INTRAMUSCULAR | Status: DC | PRN
Start: 2022-03-23 — End: 2022-03-23
  Administered 2022-03-23: 100 ug via INTRAVENOUS

## 2022-03-23 MED ORDER — HYDROMORPHONE HCL 2 MG/ML IJ SOLN (SUPER ERX)
Freq: Once | Status: DC | PRN
Start: 2022-03-23 — End: 2022-03-23
  Administered 2022-03-23: .4 mg via INTRAVENOUS

## 2022-03-23 MED ORDER — GLYCOPYRROLATE 1 MG/5ML IV SOSY (SUPER ERX)
PREFILLED_SYRINGE | Status: AC
Start: 2022-03-23 — End: 2022-03-23
  Filled 2022-03-23: qty 5

## 2022-03-23 MED ORDER — GLYCOPYRROLATE 1 MG/5ML IV SOSY (SUPER ERX)
PREFILLED_SYRINGE | Freq: Once | Status: DC | PRN
Start: 2022-03-23 — End: 2022-03-23
  Administered 2022-03-23: .4 mg via INTRAVENOUS

## 2022-03-23 MED ORDER — PHENYLEPHRINE HCL 10 MG/ML IJ SOLN (SUPER ERX)
Status: AC
Start: 2022-03-23 — End: 2022-03-23
  Filled 2022-03-23: qty 1

## 2022-03-23 MED ORDER — MIDAZOLAM HCL 2 MG/2 ML IJ SOLN
Freq: Once | INTRAMUSCULAR | Status: DC | PRN
Start: 2022-03-23 — End: 2022-03-23
  Administered 2022-03-23: 2 mg via INTRAVENOUS

## 2022-03-23 MED ORDER — IBUPROFEN 600 MG PO TABS
600.0000 mg | ORAL_TABLET | Freq: Four times a day (QID) | ORAL | 0 refills | Status: DC | PRN
Start: 2022-03-23 — End: 2022-03-23

## 2022-03-23 MED ORDER — PROPOFOL 200 MG/20ML IV EMUL
INTRAVENOUS | Status: AC
Start: 2022-03-23 — End: 2022-03-23
  Filled 2022-03-23: qty 20

## 2022-03-23 MED ORDER — LACTATED RINGERS IV SOLN
INTRAVENOUS | Status: DC
Start: 2022-03-23 — End: 2022-03-23
  Administered 2022-03-23: 700 mL via INTRAVENOUS

## 2022-03-23 MED ORDER — MIDAZOLAM HCL 2 MG/2 ML IJ SOLN
INTRAMUSCULAR | Status: AC
Start: 2022-03-23 — End: 2022-03-23
  Filled 2022-03-23: qty 2

## 2022-03-23 MED ORDER — SUCCINYLCHOLINE CHLORIDE 20 MG/ML IJ SOLN
Freq: Once | INTRAMUSCULAR | Status: DC | PRN
Start: 2022-03-23 — End: 2022-03-23
  Administered 2022-03-23: 100 mg via INTRAVENOUS

## 2022-03-23 MED ORDER — HYDROMORPHONE HCL 1 MG/ML IJ SOLN (SUPER ERX)
0.2500 mg | Status: AC | PRN
Start: 2022-03-23 — End: 2022-03-23
  Administered 2022-03-23 (×4): 0.25 mg via INTRAVENOUS
  Filled 2022-03-23 (×2): qty 0.5

## 2022-03-23 MED ORDER — SODIUM CHLORIDE 0.9 % IV SOLN
INTRAVENOUS | Status: DC | PRN
Start: 2022-03-23 — End: 2022-03-23
  Administered 2022-03-23 (×2): 20 ug/min via INTRAVENOUS
  Administered 2022-03-23: 10 ug/min via INTRAVENOUS
  Administered 2022-03-23: 30 ug/min via INTRAVENOUS

## 2022-03-23 MED ORDER — CALCIUM CARBONATE 1250 (500 CA) MG PO TABS
1.0000 | ORAL_TABLET | Freq: Four times a day (QID) | ORAL | 0 refills | Status: DC
Start: 2022-03-23 — End: 2022-04-20

## 2022-03-23 MED ORDER — CALCITRIOL 0.25 MCG PO CAPS
0.2500 ug | ORAL_CAPSULE | Freq: Every day | ORAL | 0 refills | Status: AC
Start: 2022-03-23 — End: 2022-04-22

## 2022-03-23 MED ORDER — VANCOMYCIN HCL 1 G IV SOLR
1250.0000 mg | Freq: Once | INTRAVENOUS | Status: AC
Start: 2022-03-23 — End: 2022-03-23
  Administered 2022-03-23: 1250 mg via INTRAVENOUS
  Filled 2022-03-23: qty 1.25

## 2022-03-23 MED ORDER — ACETAMINOPHEN 500 MG PO TABS
1000.0000 mg | ORAL_TABLET | Freq: Four times a day (QID) | ORAL | 0 refills | Status: DC | PRN
Start: 2022-03-23 — End: 2022-03-23

## 2022-03-23 SURGICAL SUPPLY — 29 items
.9% NACL IRRIGATION 500ML (SOLUTION) ×1 IMPLANT
1010 TOWEL DRAPE    18"X24" (DRAPE) ×2 IMPLANT
BASIC SINGLE BASIN TRAY (BASIN) ×1 IMPLANT
CHLORAPREP APPLICAOR 26ML ORGN (PREP) ×1 IMPLANT
CLIP INTERNAL MED NON ABSORBAB (ENDOMECH) ×1 IMPLANT
DRESSING SPONGE 4X4 (DRESSING) ×1 IMPLANT
ELECTROSURGICAL GROUNDING PAD (CAUTERY) ×1 IMPLANT
EMG TUBE 7MM (AIRWAY) IMPLANT
GARMENT,LOWER LEG,17,L501-M (VASCCOMP) ×1 IMPLANT
LIGASURE CVD SM JAW SEAL/DIV (ENDOCUT) ×1 IMPLANT
LOWER BAIR HUGGER BLANKET (TEMPMGMT) ×1 IMPLANT
MAGNETIC DRAPE LARGE 16"X20" (SURGEQUP) ×1 IMPLANT
NEEDLE TIP ELECTRODE 1IN (CAUTERY) ×1 IMPLANT
OPEN LAPAROTOMY PACK (PACK) ×1 IMPLANT
PEANUT SPONGE (SURGSPG) ×3 IMPLANT
PRASS STIMULATOR PROBE (INSTRACC) ×1 IMPLANT
PROXIMA TRANSVERSE LAP DRAPE (DRAPE) ×1 IMPLANT
SAFETY SCALPEL BLADE SZ 15 (SURGBLA) ×1 IMPLANT
STERI-STRIP 1/2"X4" MR1547 (WNDCLOSE) ×1 IMPLANT
SURGEON GLOVE LF/PF 6 STER (GLOVE) ×2 IMPLANT
SURGICEL HEMOSTAT 4"X8" (WNDCLOSE) ×1 IMPLANT
SUTURE BIOSYN 4-0 P12 30IN (SUTURE) ×1 IMPLANT
SUTURE POLYSORB 3-0 V20 30IN (SUTURE) ×2 IMPLANT
SUTURE SOFSILK 2-0 V20 30IN (SUTURE) ×2 IMPLANT
SUTURE SOFSILK 3-0 TIES 18IN (SUTURE) ×1 IMPLANT
TEGADERM DRESSING 2 3/8 X 2.75 (DRESSING) ×4 IMPLANT
TEGADERM DRESSING 4 X 4.75 (DRESSING) ×4 IMPLANT
TOP DRAPE 102X53 STERILE (DRAPE) ×1 IMPLANT
ULTRA CLEAN BLADE 1IN (CAUTERY) IMPLANT

## 2022-03-23 NOTE — Nursing Note (Signed)
Medication delivered?  Bed to med delivered to room

## 2022-03-23 NOTE — Discharge Instructions (Addendum)
You need to get labs checked at the [facility] at 11:30am on Monday 03/26/22. Please go to the registration desk first at [facility] and they will send you to the lab.     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 While your treatment has been planned according to the most current medical practices available, problems sometimes still happen. If you have any problems or questions after discharge, please call your surgeon at [phone].     Diet: You may resume a normal diet the day following your surgery.    Wound/Dressing Care: You have a gauze dressing and then a clear plastic covering. You may remove these 48 hrs after surgery, leave steri strips in place and allow the incision to remain open to air.  You can shower and let the soapy water run over this incision 48 hrs or more after surgery. Gently blot or dab dry following cleansing.  Keep the wound dry and clean.  Do not take baths, use swimming pools, or use hot tubs for ten days, or as instructed by your caregiver.   Activity: Keep your head elevated the first 4-5 days after surgery, even while sleeping. Get plenty of rest. Avoid heavy lifting and strenuous activity for 5 weeks. Do not over-extend your neck backwards for 2 weeks after surgery.  Medications:. Resume your usual home medications   Frequently the calcium level can drop after a parathyroidectomy. Calcium replacement is recommended. You should take '1250mg'$  of calcium carbonate (or tums) every 6 hours. You will also be given a prescription for calcitriol 0.35mg once a day.  Start both of these medications tonight (03/23/2022)  Signs and symptoms of low calcium include: numbness or tingling around the mouth, lips, hands or feet, muscle spasms or cramps, heart palpitations, forgetfulness or hallucinations, increased fatigue  Pain Management: Most pain is due to the incisions and can be controlled  often Ibuprofen and Tylenol alone.  Incisional pain typically last for up to a week, but varies with each patient. If you experience pain, you may take one to two Tylenol every 6 hrs as needed (Do not exceed '4000mg'$  in 24 hours),  You may ALSO take 3 tablets of over the counter ibuprofen/motrin (='600mg'$ ) every 6 hrs w/ food as needed (Do not exceed '2400mg'$  in 24 hours).  This means you can have one OR the other as often as every 3 hrs as needed     For some patients applying cold (ice pack) to the area will help to manage any post operative pain.  Do not apply the ice directly to your skin, place in a thin towel and apply for 15 minutes as often as every 4 hours if you would like to do so.  Other: You will need to have your calcium checked on at 11:30am on Monday 03/26/22. [facility].   Please call the Department of Surgery at [phone] return to ED if you have any of the following: Call your doctor right away if you have any of the following: Fever above 102F, excessive swelling, bleeding, warmth, redness or drainage at the incision site, choking or trouble breathing, sore throat that lasts longer than 7 days, tingling or cramps in your hands, feet, or lips, severe pain not relieved by pain medication, continued nausea and vomiting,   You will  have a follow up appointment with Dr. Orlin Hilding on 04/05/2022 at 3:15pm at [facility].

## 2022-03-23 NOTE — H&P (Deleted)
H&P NOTE     Chief Complaint: hypercalcemia    HPI: 57F w/ T2DM, HTN, HLD, h/o hypercalcemia presenting for parathyroidectomy     She recently underwent carpal release surgery, has been doing well since. No other changes to PMH/PSH/Meds/Al.     HPI       Review of Systems       Patient Active Problem List:  Patient Active Problem List:     Obesity (BMI 30-39.9)     Essential hypertension, benign     Tobacco dependence     Type 2 diabetes mellitus (HCC)     Left knee pain     Left leg pain     Headache disorder     Diverticulitis of large intestine without perforation or abscess without bleeding     History of colonic polyps     Plantar plate injury, left, initial encounter     Metatarsal deformity, left     Rash     Vitamin D deficiency     Hyperlipidemia associated with type 2 diabetes mellitus (HCC)     Astigmatism of both eyes with presbyopia     Primary hyperparathyroidism ([place])     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     Hypercalcemia     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(HCC)     Right carpal tunnel syndrome     Left carpal tunnel syndrome     Right foot pain     Osteopenia      Past Medical History:   Past Medical History:  06/22/2017: Astigmatism of both eyes with presbyopia  No date: Diabetes mellitus ([place])  No date: HTN (hypertension)    Past Surgical History:   Past Surgical History:  No date: CARPAL TUNNEL RELEASE; Right  No date: LAPAROSCOPY FULGURATION OVIDUCTS  No date: OB ANTEPARTUM CARE CESAREAN DLVR & POSTPARTUM  No date: TOE SURGERY; Left      Comment:  5 years ago  No date: TUBAL LIGATION    Medications prior to Admission:   No medications prior to admission.      Allergies:   Review of Patient's Allergies indicates:   Penicillins             Other (See Comments)    Comment:Took skin 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 & was positive, never             actually took it   Thiazide-type diure*    Other (See Comments)    Comment:Stopped due to low K+    Social History:   Tobacco Use: Social History    Tobacco Use       Smoking status: Former        Packs/day: 0.33        Years: 10.00        Additional pack years: 0.00        Total pack years: 3.30        Types: Cigarettes        Quit date: 10/15/2012        Years since quitting: 9.4      Smokeless tobacco: Never      Tobacco comments: quit 3 months ago on 10/15/12     Alcohol:   Alcohol use   Yes      Comment:   minimal          Family History: Review of patient's family history indicates:  Problem: Cancer - Other      Relation: Mother  Age of Onset: (Not Specified)          Comment: stomach cancer, died  Problem: Heart Disease      Relation: Father          Age of Onset: (Not Specified)          Comment: died age 78 from MI; learned about HTN in 54's  Problem: GI      Relation: Sister          Age of Onset: (Not Specified)          Comment: hernia  Problem: Thyroid      Relation: Sister          Age of Onset: (Not Specified)      Intake/Output last 24hours (7a-7a):   No intake/output data recorded.    Vital Signs - Last 24 Hours:   BP: --  Temp: --  Pulse: --  Resp: --  SpO2: --    Vital Signs - Last 8 Hours:   BP: --  Temp: --  Pulse: --  Resp: --  SpO2: --    Physical Exam  Gen: NAD  HEENT: Supple, no palpable masses/lesions  CV: No apparent cardiopulmonary distress  P: Normal WOB on RA  GI: Non-distended  Ext: WWP         All data in last 24hours, labs only:  Recent labs:  Lab Results   Component Value Date    CA 11.2 (H) 12/08/2021    NA 140 12/08/2021    K 4.2 12/08/2021    CL 107 12/08/2021    MG 1.9 07/11/2015    CO2 27 12/08/2021    PHOS 2.4 (L) 10/03/2021    BUN 15 12/08/2021    CREAT 0.8 12/08/2021    GLUCOSER 89 12/08/2021     Lab Results   Component Value Date    WBC 10.5 11/21/2015    HGB 11.9 11/21/2015    HCT 36.4 11/21/2015    PLTA 174 11/21/2015    RBC 4.15 11/21/2015    ALBUMIN 4.5 12/08/2021     VitD 45  TSH 1.95  PTH 38    Imaging:   Thyroid US 11/30/21  There is a 1.3cm hypoechoic lesion posterior to the R lobe of the thyroid which could be a  parathyroid    DEXA 02/27/22  The lowest measured BMD is in the left hip with a T-score of -1.6. This patient is considered to have low bone mass (osteopenia) according to the World Health Organization [facility]) criteria.     Assessment/Plan:  57F w/ T2DM w/ T2DM, HTN, HLD, h/o hypercalcemia presenting for parathyroidectomy     '[ ]'$  Proceed with OR     Cicero Duck, MD, 03/23/2022

## 2022-03-23 NOTE — Anesthesia Postprocedure Evaluation (Signed)
Anesthesia Post-Operative Evaluation Note    Patient: Amanda Cervantes           Procedure Summary       Date: 03/23/22 Room / Location: [place] [facility] / [place]    Anesthesia Start: 1941 Anesthesia Stop:     Procedure: PARATHYROIDECTOMY (Bilateral: Neck) Diagnosis:       Primary hyperparathyroidism ([place])      (Primary hyperparathyroidism ([place]) [E21.0])    Surgeons: Corena Pilgrim, MD Responsible Provider: Providence Lanius, MD    Anesthesia Type: general ASA Status: 2              POST-OPERATIVE EVALUATION    Anesthesia Post Evaluation    Vitals signs in patient's normal range: Yes  Respiratory function stable; airway patent: Yes  Cardiovascular function stable: Yes  Hydration status stable: Yes  Mental status recovered; patient participates in evaluation and/or is at baseline: Yes  Pain control satisfactory: Yes  Nausea and vomiting control satisfactory: YesProcedure was labor & delivery no  PostOP disposition PACU  Anesthesia Observation no significant observation    MIPS#404 Anesthesiology Smoking Abstinence:     The patient is a current smoker (e.g. cigarette, cigar, pipe, e-cigarette/vaping/marijuana) (D4081): Yes   The patient underwent an elective surgery or procedure requiring anesthesia (K4818) : Yes   The patient received preop smoking cessation instructions prior to the day of surgery or procedure by MD, APC or RN proxy staff [phone]): Yes   The patient smoked the day of the procedure (H7026): No  FOR CODING USE ONLY: IF BLANK V7858    MIPS#477 Multimodal Pain Management:  Emergent - Exclusion case: No  Patient was administered multimodal pain management (two or more drugs and/or interventions excluding systemic opioids) in the perioperative period; occurring at some time between 6 hours prior to anesthesia start time until discharged from PACU (I5027): Yes   FOR CODING USE ONLY: REASON NOT LISTED X4128    NOMV#672 Perioperative Temperature Management:  Anesthesia start to Anesthesia end time was 60 minutes or  longer (4255F): Yes   Anesthesia administered was General (Inhalational, or TIVA) or Neuraxial block (C9470): Yes   At least one body temperature greater than 95.18F/35.5C achieved within the 30 mins immediately prior to or the15 mins immediately following anesthesia end time (J6283): Yes   FOR CODING USE ONLY: REASON NOT LISTED M6294    MIPS#430 Adult Prevention of PONV:  Patient received an inhalational anesthetic (7654Y): Yes  Patient exhibits three or more risk factors for PONV (T0354): No  Patient RECEIVED at least 2 prophylactic Rx PONV anti-emetic agents of different classes preop and/or intraop (S5681): Yes    FOR CODING USE ONLY: REASON NOT LISTED E7517    MIPS#463 Pediatric Prevention of PONV:  Pediatric patient?: No  FOR CODING USE ONLY: REASON NOT LISTED G0174        [employer] # [id number]          Last vitals    BP: 144/81 (03/23/2022  9:21 AM)  Temp: 97.6 F (36.4 C) (03/23/2022  9:21 AM)  Pulse: 66 (03/23/2022  9:21 AM)  Resp: 14 (03/23/2022  9:21 AM)  SpO2: 98 % (03/23/2022  9:21 AM)

## 2022-03-23 NOTE — Interval H&P Note (Signed)
Patient Assessment Update: (Fill out Prior to procedure or within 24 hours of  admission if H&P done pre-admission.)   Re-evaluation including history review and physical examination has been performed.    Patient's Condition No Change    Corena Pilgrim, MD, 03/23/2022

## 2022-03-23 NOTE — Brief Op Note (Signed)
Brief Procedure or Operative Note    Procedure:  Procedure(s):  PARATHYROIDECTOMY    Pre-Procedure Diagnosis: Pre-op Diagnosis     * Primary hyperparathyroidism ([place]) [E21.0]     Post-Procedure Diagnosis: Post-op Diagnosis     * Primary hyperparathyroidism ([place]) [E21.0]    Surgeon: Primary: Corena Pilgrim, MD  Resident - Assisting: Cicero Duck, MD    Assistant (if applicable):     Type of Anesthesia:  General    Findings:   Parathyroid adenoma identified at superoposterior aspect of R thyroid lobe, confirmed parathyroid tissue by pathologist.    Estimated Blood Loss: 3 mL    Specimens Removed:   ID Type Source Tests Collected by Time   A : QUESTION PARATHYROID Parathyroid ENT [facility] Corena Pilgrim, MD 03/23/2022 1250   B : QUESTION PARATHYROID Parathyroid ENT [facility] Corena Pilgrim, MD 10/23/2822 1753       Complications:  None    Other (e.g. Implants):  Nothing was implanted during the procedure   Peripheral IV 03/23/22 Right Antecubital fossa (Active)         Minna Antis, MD                    .

## 2022-03-23 NOTE — Op Note (Signed)
Date of Operation: 03/23/22     PREOPERATIVE DIAGNOSIS: Hypercalcemia secondary to primary hyperparathyroidism.     POSTOPERATIVE DIAGNOSIS: Hypercalcemia secondary to primary  hyperparathyroidism.     NAME OF OPERATION: Neck exploration and parathyroidectomy.     SURGEON: Corena Pilgrim, MD.     ASSISTANT: Minna Antis MD.     ANESTHESIA: General with endotracheal intubation.     INDICATIONS: The patient is a 57 yr old lady with hypercalcemia and hyperparathyroidism. Sestamibi scan demonstrates uptake in the right neck adjacent to the right lobe of the thyroid gland suggestive of abnormal parathyroid gland. The patient has been extensively counseled preoperatively and comes today for removal of her abnormal parathyroid glands.     FINDINGS: Abnormal parathyroid gland located posterior to mid/upper aspect of right lobe, near traceho-esophageal groove.     There was an enlarged parathyroid gland noted posterior to the upper aspect of the right thyroid lobe. This was removed first, frozen section confirmed this to be parathyroid tissue, the PTH level dropped appropriately, confirming that the abnormal gland had been removed.     DESCRIPTION OF OPERATIVE PROCEDURE: After obtaining informed consent, the patient was brought into the OR. Surgical checklist was performed. General anesthesia was administered with endotracheal intubation. The NIM nerve monitoring device was used throughout the case. The patient was placed with neck extended. The neck, chin, and upper chest area was prepped and draped in the usual sterile fashion. The area was prepped and draped, and a short transverse incision was made 2 fingerbreadths above the sternal notch. The incision was carried down to the underlying subcutaneous tissue and platysma. The anterior cervical fascia was divided vertically in the midline. The right strap muscles were separated from each other and from the underlying thyroid lobe to expose the thyroid gland. The strap muscles  were retracted to expose the right lobe of the thyroid gland.     Careful dissection was then done. Exploration was done lateral to the right lobe, as well as inferior to the inferior pole. This was carefully dissected free. It was close to the inferior thyroid vessels. The nerve monitoring device was used throughout the case. A fairly large abnormal-appearing parathyroid gland was removed intact. It was sent off for frozen section, and a PTH level was drawn 10 minutes after this gland was removed. The frozen section confirmed this to be parathyroid tissue. The PTH level dropped from to 55 preop to 22.    The operative area was thoroughly irrigated. The patient was placed in Trendelenburg position, and all oozing points were controlled. After confirmation of hemostasis, the wound was closed in a layered fashion with 3-0 Vicryl for the anterior cervical fascia and platysma and 4-0 Biosyn subcuticular closure for the skin. Steri-Strips, dry sterile dressing, and Tegaderm were placed on the wounds. The patient tolerated the procedure well; left the OR stable, extubated.

## 2022-03-26 ENCOUNTER — Telehealth (HOSPITAL_BASED_OUTPATIENT_CLINIC_OR_DEPARTMENT_OTHER): Payer: Self-pay | Admitting: Licensed Practical Nurse

## 2022-03-26 ENCOUNTER — Ambulatory Visit
Admission: RE | Admit: 2022-03-26 | Discharge: 2022-03-26 | Disposition: A | Discharge status: Home / Self Care | Payer: No Typology Code available for payment source | Attending: Physician Assistant | Admitting: Physician Assistant

## 2022-03-26 ENCOUNTER — Other Ambulatory Visit: Payer: Self-pay

## 2022-03-26 DIAGNOSIS — Z9089 Acquired absence of other organs: Secondary | ICD-10-CM

## 2022-03-26 DIAGNOSIS — E892 Postprocedural hypoparathyroidism: Secondary | ICD-10-CM

## 2022-03-26 DIAGNOSIS — Z0000 Encounter for general adult medical examination without abnormal findings: Secondary | ICD-10-CM | POA: Diagnosis present

## 2022-03-26 LAB — CALCIUM IONIZED: CALCIUM IONIZED: 5.3 mg/dL (ref 4.5–5.3)

## 2022-03-26 NOTE — PostOp Call (Signed)
POST OP PHONE CALL FORM       Activities of Daily Living:     Dressing:  Independent    Bathing:  Independent    Ambulation:  Independent      Dressings and Wound Care:     Dressing/Wound Care Type:  Sutures, Steri Strips and DSD    Signs and Symptoms of Infection: No      Care Instructions Reviewed:  Yes - Patient Verbalized Understanding      Gastrointestinal:     Bowels Returned to Normal since Procedure:  Returned to Normal    Colostomy: No      Ileostomy: No        Nutrition:     Return to Pre-Procedure Diet: Yes        Drains/Catheters:     Drains/Catheters: No        New Problems:     Have you had any new problems since your procedure?: No

## 2022-03-26 NOTE — Telephone Encounter (Signed)
Call placed to pt, via interpreter [phone]. D/c instructions reviewed. Post-op call completed.

## 2022-03-28 ENCOUNTER — Ambulatory Visit
Admission: RE | Admit: 2022-03-28 | Discharge: 2022-03-28 | Disposition: A | Discharge status: Home / Self Care | Payer: No Typology Code available for payment source | Source: Ambulatory Visit

## 2022-03-28 ENCOUNTER — Encounter (HOSPITAL_BASED_OUTPATIENT_CLINIC_OR_DEPARTMENT_OTHER): Payer: Self-pay

## 2022-03-28 ENCOUNTER — Other Ambulatory Visit: Payer: Self-pay

## 2022-03-28 HISTORY — DX: Personal history of other diseases of the circulatory system: Z86.79

## 2022-03-28 HISTORY — DX: Personal history of other endocrine, nutritional and metabolic disease: Z86.39

## 2022-03-28 NOTE — Surgery Pre-Op (Signed)
PAT Visit          Amanda Cervantes is a 57 year old female received a preoperative screening.        Appointments for Next 30 Days 03/28/2022 - 04/27/2022        Date Visit Type Department Provider     03/28/2022 10:00 AM PRE-ADMISSION [facility]    Patient Instructions:                  04/05/2022  3:15 PM OFF 15 F/U [facility] Corena Pilgrim, MD    Patient Instructions:                  04/06/2022  3:30 PM RX CONSULT [address] [place], [place]    Patient Instructions:     Your visit is scheduled with our clinical pharmacist, who is an expert in managing medications. Please bring the following things to your scheduled appointment: your medication bottles, blood pressure monitor, blood sugar monitor, and logs for blood sugar/pressure, if you have them. Please arrive 15 minutes early to check-in.                 04/20/2022 11:10 AM OFF40 [facility] Hanley Ben, MD    Patient Instructions:                       Surgery Information       Future Procedures (Tomorrow to 03/28/2023)         Date Time Visit Type/Procedure Providers Loc / Dept Status        04/12/2022 0815 RELEASE, CARPAL TUNNEL - Left Floyde Parkins [facility] OR Scheduled      Visit Type/Procedure: RELEASE, CARPAL TUNNEL - Left                          Procedure(s):  RELEASE, CARPAL TUNNEL  04/12/2022        .     Language of care:Portuguese ([place])    Allergy History  Penicillins and Thiazide-Type Diuretics      Past Medical History:  06/22/2017: Astigmatism of both eyes with presbyopia  No date: Diabetes mellitus ([place])  No date: HTN (hypertension)     has a past surgical history that includes Tubal ligation; OB ANTEPARTUM CARE C DLVR&POSTPARTUM; LAPS FULG OVIDUXS; Toe Surgery (Left); CARPAL TUNNEL RELEASE (Right); and Parathyroidectomy (Right).         Alcohol,  Tobacco and Drug History:  Social History    Tobacco Use      Smoking status: Former        Packs/day: 0.33        Years: 10.00        Additional pack years: 0.00        Total pack years: 3.30        Types: Cigarettes        Quit date: 10/15/2012        Years since quitting: 9.4      Smokeless tobacco: Never      Tobacco comments: quit 3 months ago on 10/15/12    E-Cigarette/Vaping  E-Cigarette Use: Never User  Quit Date:   Nicotine:   Start Date:   THC:    Cartridges/Day:   CBD:   Other Substance:  Flavoring:       Alcohol use   Yes      Comment:   minimal          Drug use:   No       Extended Emergency Contact Information  Primary Emergency Contact: [facility]  Address: [address] [place], [place] [address] [place] Phone: [phone]  Mobile Phone: [phone]  Relation: Daughter  Secondary Emergency Contact: Livingston Diones  Address: [place], [place] [address] [place] Phone: [phone]  Mobile Phone: [phone]  Relation: Daughter        Current Outpatient Medications   Medication Sig    calcitRIOL (ROCALTROL) 0.25 MCG capsule Take 1 capsule by mouth in the morning.    calcium carbonate (OS-CAL 1250) 1250 MG tablet Take 1 tablet by mouth every 6 (six) hours    ibuprofen (ADVIL) 600 MG tablet Take 1 tablet by mouth every 6 (six) hours as needed for Pain  for up to 5 days    acetaminophen (TYLENOL) 500 MG tablet Take 2 tablets by mouth every 6 (six) hours as needed for Pain (Take regularly for first 2-3 days after surgery, then as needed for pain.)  for up to 7 days    metoprolol (TOPROL-XL) 50 MG 24 hr tablet Take 1 tablet by mouth in the morning.    amLODIPine (NORVASC) 10 MG tablet Take 1 tablet by mouth in the morning.    D3-1000 25 MCG (1000 UT) tablet TAKE 2 TABLETS BY MOUTH DAILY    atorvastatin (LIPITOR) 10 MG tablet Take 1 tablet by mouth nightly    TRULICITY 1.5 DT/2.6ZT injection INJECT 1.5 MG UNDER THE SKIN ONCE A WEEK     metFORMIN (GLUCOPHAGE-XR) 750 MG 24 hr tablet TAKE 2 TABLETS BY MOUTH NIGHTLY    lisinopril (ZESTRIL) 40 MG tablet TAKE 1 TABLET BY MOUTH DAILY     No current facility-administered medications for this encounter.         (Not in a hospital admission)      PAT Assessment                                                                There were no vitals filed for this visit.        Patient issues currently are  PRE OP AND ALL QUESTIONS ANSWERED VIA INT AND PT UNDERSTANDS TO HOLD TRULICITY JAN 24PY . Marland Kitchen  Medication instructions  AND  AVS  REVIEWED WITH  patient.   CHX wipes instructions were given and reviewed.       Gwendel Hanson, RN

## 2022-03-28 NOTE — Discharge Instructions (Addendum)
INSTRUES DO PR-OPERATRIO PARA O SURGICAL DAY CARE   SURGICAL DAY CARE PRE-OPERATIVE INSTRUCTIONS    DIA DA CIRURGIA  DAY OF SURGERY    Chegar em: [facility] (Thursday), [place] (January) 25 s (Time): 615am.   Arrive at:  Registration on (Day of the Week, Month, Day, at (Time).        obrigatrio ter um adulto responsvel disponvel para acompanh-lo at [employer] a [place]. (Sugerimos que voc tenha algum disponvel para ajud-lo em casa aps a sua cirurgia).  You must have a responsible adult available to accompany you home after surgery. (We suggest that you have someone available to assist you at home after your surgery).    INSTRUES:   INSTRUCTIONS:  Voc no poder comer ou beber nada aps a meia-noite da noite anterior  cirurgia, nem mesmo gua, goma de mascar ou balas.   You may have nothing to eat or drink after midnight the night before your surgery, not even water, gum or hard candy.    Voc no poder fumar na manh do dia da sua cirurgia.  You may not smoke the morning of your surgery.    Por favor, deixe em casa todos os objetos de valor, incluindo joias, relgios, dinheiro, etc.  Please leave all valuables at home, including jewelry, watches, money, etc.    Remova qualquer esmalte das unhas antes de chegar ao Surgical Day Care e no use qualquer maquiagem ou batom.  Please remove all fingernail polish before arriving at [facility] Care and do not  wear any face or lip make-up.    Se for fazer cirurgia ocular, no aplique maquiagem nos olhos ou no rosto e evite hidratantes faciais e perfumes.  If you are having eye surgery, do not wear any eye or face makeup and avoid facial moisturizers and perfumes.    Por favor, retire quaisquer extenses de cabelo que possam ser [employer].  Please take out any hair extensions that can be removed.    No raspe os pelos da rea da cirurgia.  Do not shave surgical site.    No use lentes de contato.  Do not wear contact  lenses.    MEDICAMENTOS:   MEDICATIONS:     Tome a medicao a seguir na noite anterior  cirurgia, na hora de dormir: usual meds    Take the following medication the night before surgery at bedtime:     Tome a medicao a seguir na manh do dia da sua cirurgia, com apenas um gole de gua: metropolol, calcitrol, amlodipine, ATORVASTIN    HOLD TRULICITY JAN 03ES PRE OP   Take the following medication the morning of your surgery with only a sip of water:

## 2022-04-03 LAB — SURGICAL PATH SPECIMEN ENT

## 2022-04-05 ENCOUNTER — Ambulatory Visit: Payer: No Typology Code available for payment source | Attending: Surgery | Admitting: Surgery

## 2022-04-05 ENCOUNTER — Other Ambulatory Visit: Payer: Self-pay

## 2022-04-05 DIAGNOSIS — Z9089 Acquired absence of other organs: Secondary | ICD-10-CM

## 2022-04-05 DIAGNOSIS — E892 Postprocedural hypoparathyroidism: Secondary | ICD-10-CM | POA: Diagnosis not present

## 2022-04-05 NOTE — Progress Notes (Signed)
F/u after recent neck exploration and parathyroidectomy.  Doing very well, no complaints.  Normal voice, no neck swelling, no signs of hypocalcemia    Path= parathyroid adenoma    F/u with Dr Hurshel Keys.    >>FINAL DIAGNOSIS<<     QUESTION PARATHYROID:   -178 mg hypercellular parathyroid with normal rim.  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      QUESTION PARATHYROID   -477 mg hypercellular parathyroid.  See note      Note: Hypercellular parathyroid with normal rim ; features most consistent   with parathyroid adenoma (655 mg, total weight) in the appropriate clinical   context.  Clinical correlation is required.      Diagnosis by: Gilford Raid, MD

## 2022-04-06 ENCOUNTER — Ambulatory Visit: Payer: No Typology Code available for payment source | Attending: Internal Medicine | Admitting: Pharmacist

## 2022-04-06 ENCOUNTER — Other Ambulatory Visit: Payer: Self-pay

## 2022-04-06 VITALS — BP 123/85

## 2022-04-06 DIAGNOSIS — I10 Essential (primary) hypertension: Secondary | ICD-10-CM

## 2022-04-06 DIAGNOSIS — E119 Type 2 diabetes mellitus without complications: Secondary | ICD-10-CM | POA: Diagnosis present

## 2022-04-06 DIAGNOSIS — E1165 Type 2 diabetes mellitus with hyperglycemia: Secondary | ICD-10-CM | POA: Diagnosis not present

## 2022-04-06 MED ORDER — TRULICITY 1.5 MG/0.5ML SC SOPN
1.5000 mg | PEN_INJECTOR | SUBCUTANEOUS | 11 refills | Status: DC
Start: 2022-04-06 — End: 2022-07-16

## 2022-04-06 MED ORDER — LISINOPRIL 40 MG PO TABS
40.00 mg | ORAL_TABLET | Freq: Every day | ORAL | 3 refills | Status: AC
Start: 2022-04-06 — End: 2023-04-06

## 2022-04-06 NOTE — Progress Notes (Signed)
Diabetes and Hypertension Management Follow Up Visit     Subjective:    CONDITION(S) ADDRESSED AT THIS PHARMACOTHERAPY VISIT:  Diabetes and Hypertension/Elevated BP     Translator used for this visit: Yes, [place] ([place]).     Amanda Cervantes is a 57 year old female with Type 2, uncontrolled DM who presents for follow up diabetes management. The patient was last seen by Pharmacotherapy Services on 01/16/23    Medication reconciliation was completed today verbally.    Diabetes medication regimen:   Trulicity 1.5 mg weekly  Metformin XR 750 mg tabs: 2 tabs (1500 mg) nightly    Hypertension medication regimen:  Amlodipine 10 mg QAM  Lisinopril 40 mg daily  Metoprolol XL 50 mg QAM    Adherence Patient reports positive adherence to diabetes medications.    ADRs: Denies any adverse drug reactions.    Social History    Tobacco Use      Smoking status: Former        Packs/day: 0.33        Years: 10.00        Additional pack years: 0.00        Total pack years: 3.30        Types: Cigarettes        Quit date: 10/15/2012        Years since quitting: 9.4      Smokeless tobacco: Never      Tobacco comments: quit 3 months ago on 10/15/12    Alcohol use: Yes      Comment: minimal    Drug use: No      S/sx hypoglycemia: None reported  S/Sx hyperglycemia: None reported    Self-Monitoring of Blood Glucose (SMBG): Tests occasionally (not every day). Not reviewed today.    SMBP: Checks daily; today was 120/90    Diet/Physical Activity: \    Blood pressure cuff validation per AMA 2020  STEP 1: Complete the table below.  5 blood pressure readings taken using a combination of the patient's SMBP device and the office's method of blood pressure measurement.     MEASUREMENT DEVICE SYSTOLIC BLOOD PRESSURE (SBP)   A PATIENT'S 145/90   B PATIENT'S 131/89   C OFFICE'S 123/85   D PATIENT'S 131   E OFFICE'S 127/85 P72     STEP 2:   Average measurements B and D ((B+D)/2): 131  The difference of average of B and D (above) and measurement C  (Above - C): 131 -123 = 8  The difference is: Between 6 and 10 mm Hg, proceed to Step 3  *If the difference is <58mHg: SKIP step 3    STEP 3:  Average measurements C and E ((C+E)/2): 125  The difference of average of C and E (above) - measurement D  (Above - D): 131-125 = 6  The difference is: <= 10 mmHg: This device CAN BE used for SMBP     Objective:    Blood pressure at visit today: 123/85    Most Recent BP Reading(s)  03/23/22 : 110/70  03/15/22 : (!) 140/93  03/07/22 : 135/86      HEMOGLOBIN A1C (%)   Date Value   05/30/2020 8.3 (*H)   10/05/2019 12.4 (*H)   09/04/2018 9.2 (*H)     POC HEMOGLOBIN A1C (%)   Date Value   01/15/2022 6.3 (H)   07/10/2021 6.1 (H)   01/16/2021 6.4 (H)     Estimated body mass index is 32.74 kg/m  as calculated from the following:    Height as of 03/28/22: '5\' 2"'$  (1.575 m).    Weight as of 03/28/22: 81.2 kg (179 lb).      CREATININE (mg/dL)   Date Value   12/08/2021 0.8   10/03/2021 1.0   10/20/2020 1.0     POTASSIUM (mmol/L)   Date Value   12/08/2021 4.2   10/03/2021 4.1   10/20/2020 3.8     SODIUM (mmol/L)   Date Value   12/08/2021 140   10/03/2021 142   10/20/2020 136     ESTIMATED GLOMERULAR FILT RATE (ML/MIN)   Date Value   12/08/2021 > 60   10/03/2021 > 60   10/20/2020 > 60     Assessment/Plan/Recommendations:    Diabetes  Last A1C was at goal (< 8%) at  6.3%. Next due: ~ 07/17/22  SMBG: Not reviewed, but patient reports she continues to be well-controlled. Continue current testing regimen. Bring logbook and/or glucometer to next clinic visit.  Medications:   Continue Trulicity 1.5 mg weekly  Continue metformin XR 1500 mg nightly  Diet/Exercise/physical activity: Continue current regimen.  Monitoring  Last UACR: negative for albuminuria. ACE-I/ARB? Yes. Next due: 12/2022  Annual diabetic eye exam: Overdue  Flu shot (annual): Up to date  Last B12 (if taking metformin):No data. Next due: Consider with next labs   EGFR: > 60 ml/min; Last BMP 12/08/21   PCP appointment within the last  year? Yes  Blood Pressure  Last blood pressure was at goal (Goal: <130/80 per ADA guidelines).  SMBP: BP cuff validated today ([employer]).  Interventions: Continue amlodipine 10 mg QAM, lisinopril 40 mg daily, metoprolol XL 50 mg QAM  Will re-assess at follow up.  ASCVD Prevention  Last lipid panel revealed LDL 69 mg/dL. Next due: 12/2022  Antiplatelet: No.  Interventions: Continue atorvastatin 10 mg daily  Will re-assess at follow up    Follow up: 07/16/22 (DM + HTN)    Encouraged patient to keep scheduled pharmacotherapy visits and to call site pharmacist with questions.     Provided dosing instructions. Patient verbalized understanding of everything discussed and agrees with plan. Patient was given the opportunity to ask questions/voice concerns.

## 2022-04-12 ENCOUNTER — Encounter (HOSPITAL_BASED_OUTPATIENT_CLINIC_OR_DEPARTMENT_OTHER): Payer: Self-pay | Admitting: Specialist

## 2022-04-12 ENCOUNTER — Encounter (HOSPITAL_BASED_OUTPATIENT_CLINIC_OR_DEPARTMENT_OTHER)
Admission: RE | Disposition: A | Discharge status: Home / Self Care | Payer: Self-pay | Source: Ambulatory Visit | Attending: Specialist

## 2022-04-12 ENCOUNTER — Ambulatory Visit (HOSPITAL_BASED_OUTPATIENT_CLINIC_OR_DEPARTMENT_OTHER): Payer: No Typology Code available for payment source

## 2022-04-12 ENCOUNTER — Ambulatory Visit
Admission: RE | Admit: 2022-04-12 | Discharge: 2022-04-12 | Disposition: A | Discharge status: Home / Self Care | Payer: No Typology Code available for payment source | Attending: Specialist | Admitting: Specialist

## 2022-04-12 ENCOUNTER — Other Ambulatory Visit: Payer: Self-pay

## 2022-04-12 DIAGNOSIS — E7800 Pure hypercholesterolemia, unspecified: Secondary | ICD-10-CM | POA: Diagnosis not present

## 2022-04-12 DIAGNOSIS — I10 Essential (primary) hypertension: Secondary | ICD-10-CM | POA: Diagnosis not present

## 2022-04-12 DIAGNOSIS — Z87891 Personal history of nicotine dependence: Secondary | ICD-10-CM | POA: Diagnosis not present

## 2022-04-12 DIAGNOSIS — E119 Type 2 diabetes mellitus without complications: Secondary | ICD-10-CM | POA: Diagnosis not present

## 2022-04-12 DIAGNOSIS — Z79899 Other long term (current) drug therapy: Secondary | ICD-10-CM | POA: Insufficient documentation

## 2022-04-12 DIAGNOSIS — G5602 Carpal tunnel syndrome, left upper limb: Secondary | ICD-10-CM | POA: Insufficient documentation

## 2022-04-12 DIAGNOSIS — Z7984 Long term (current) use of oral hypoglycemic drugs: Secondary | ICD-10-CM | POA: Diagnosis not present

## 2022-04-12 DIAGNOSIS — G4733 Obstructive sleep apnea (adult) (pediatric): Secondary | ICD-10-CM | POA: Diagnosis not present

## 2022-04-12 LAB — BLOOD SUGAR FINGERSTICK (POINT OF CARE)
FINGERSTICK GLUCOSE: 116 mg/dl (ref 74–160)
FINGERSTICK GLUCOSE: 124 mg/dl (ref 74–160)

## 2022-04-12 SURGERY — RELEASE, CARPAL TUNNEL
Anesthesia: Monitor Anesthesia Care | Site: Wrist | Laterality: Left | Wound class: Class I/ Clean

## 2022-04-12 MED ORDER — ACETAMINOPHEN 500 MG PO TABS
1000.00 mg | ORAL_TABLET | Freq: Three times a day (TID) | ORAL | 0 refills | Status: AC | PRN
Start: 2022-04-12 — End: 2022-04-19

## 2022-04-12 MED ORDER — IBUPROFEN 600 MG PO TABS
600.00 mg | ORAL_TABLET | Freq: Four times a day (QID) | ORAL | 0 refills | Status: AC | PRN
Start: 2022-04-12 — End: 2022-04-19

## 2022-04-12 MED ORDER — LIDOCAINE HCL (PF) 2 % IJ SOLN
INTRAMUSCULAR | Status: AC
Start: 2022-04-12 — End: 2022-04-12
  Filled 2022-04-12: qty 5

## 2022-04-12 MED ORDER — MIDAZOLAM HCL 2 MG/2 ML IJ SOLN
INTRAMUSCULAR | Status: AC
Start: 2022-04-12 — End: 2022-04-12
  Filled 2022-04-12: qty 2

## 2022-04-12 MED ORDER — PROPOFOL 200 MG/20 ML IV - AN
Freq: Once | INTRAVENOUS | Status: DC | PRN
Start: 2022-04-12 — End: 2022-04-12
  Administered 2022-04-12: 20 mg via INTRAVENOUS
  Administered 2022-04-12: 50 mg via INTRAVENOUS

## 2022-04-12 MED ORDER — LACTATED RINGERS IV SOLN
INTRAVENOUS | Status: DC
Start: 2022-04-12 — End: 2022-04-12
  Administered 2022-04-12: 500 mL/h via INTRAVENOUS

## 2022-04-12 MED ORDER — FENTANYL CITRATE 0.05 MG/ML IJ SOLN
INTRAMUSCULAR | Status: AC
Start: 2022-04-12 — End: 2022-04-12
  Filled 2022-04-12: qty 2

## 2022-04-12 MED ORDER — LIDOCAINE HCL (PF) 2 % IJ SOLN
Freq: Once | INTRAMUSCULAR | Status: DC | PRN
Start: 2022-04-12 — End: 2022-04-12
  Administered 2022-04-12: 100 mg via INTRAVENOUS

## 2022-04-12 MED ORDER — PROPOFOL 200 MG/20ML IV EMUL
INTRAVENOUS | Status: AC
Start: 2022-04-12 — End: 2022-04-12
  Filled 2022-04-12: qty 20

## 2022-04-12 MED ORDER — MIDAZOLAM HCL 2 MG/2 ML IJ SOLN
Freq: Once | INTRAMUSCULAR | Status: DC | PRN
Start: 2022-04-12 — End: 2022-04-12
  Administered 2022-04-12: 2 mg via INTRAVENOUS

## 2022-04-12 MED ORDER — BUPIVACAINE HCL (PF) 0.5 % IJ SOLN
INTRAMUSCULAR | Status: AC
Start: 2022-04-12 — End: 2022-04-12
  Administered 2022-04-12: 10 mL
  Filled 2022-04-12: qty 20

## 2022-04-12 MED ORDER — FENTANYL CITRATE 0.05 MG/ML IJ SOLN
Freq: Once | INTRAMUSCULAR | Status: DC | PRN
Start: 2022-04-12 — End: 2022-04-12
  Administered 2022-04-12 (×2): 50 ug via INTRAVENOUS

## 2022-04-12 MED ORDER — FENTANYL CITRATE 0.05 MG/ML IJ SOLN
25.0000 ug | INTRAMUSCULAR | Status: DC | PRN
Start: 2022-04-12 — End: 2022-04-12

## 2022-04-12 MED ORDER — LIDOCAINE HCL 1 % IJ SOLN
INTRAMUSCULAR | Status: AC
Start: 2022-04-12 — End: 2022-04-12
  Administered 2022-04-12: 10 mL via SUBCUTANEOUS
  Filled 2022-04-12: qty 20

## 2022-04-12 SURGICAL SUPPLY — 15 items
18IN TOURNIQUET CUFF (SURGPNE) ×1 IMPLANT
25G X 1IN SAFETY NEEDLE (NEEDLE) ×1 IMPLANT
ACE BANDAGE STERILE 2IN L/F (BANDAGE) ×1 IMPLANT
BULKEE II GAUZE BANDAGE 4.5 (BANDAGE) ×1 IMPLANT
CHLORAPREP APPLICAOR 26ML ORGN (PREP) ×1 IMPLANT
DISPOSABLE ALUMINUM HAND LG (SOFTGOOD) ×1 IMPLANT
DRESSING SPONGE 4X4 (DRESSING) ×5 IMPLANT
ELECTROSURGICAL SAFETY HOLSTER (CAUTERY) ×1 IMPLANT
PACK HAND (PACK) ×1 IMPLANT
SAFETY SCALPEL BLADE SZ 15 (SURGBLA) ×2 IMPLANT
SPONGE,8X4,XRAY,RFID (DRESSING) ×1 IMPLANT
SURGEON GLOVE LF/PF 8 STER (GLOVE) ×1 IMPLANT
SUTURE NYLON 4-0 P12 18IN (SUTURE) ×1 IMPLANT
SYRINGE 10CC LL (SYRINGE) ×1 IMPLANT
SZ 8 L/F ORTH SURG GLOVE (GLOVE) ×1 IMPLANT

## 2022-04-12 NOTE — Anesthesia Preprocedure Evaluation (Signed)
Pre-Anesthetic Note  .      Patient: Amanda Cervantes is a 57 year old female      Procedure Information       Date/Time: 04/12/22 0815    Procedure: RELEASE, CARPAL TUNNEL (Left)    Diagnosis: Left carpal tunnel syndrome [G56.02]    Pre-op diagnosis: Left carpal tunnel syndrome [G56.02]    Location: [place] OR 4 / [place] OR    Surgeons: Floyde Parkins, MD            Relevant Problems   NEURO/PSYCH   (+) Headache disorder      CARDIO   (+) Essential hypertension, benign      ENDO   (+) Type 2 diabetes mellitus ([place])           Previous Anesthetic History:   Past Surgical History:  No date: CARPAL TUNNEL RELEASE; Right  No date: LAPAROSCOPY FULGURATION OVIDUCTS  No date: OB ANTEPARTUM CARE CESAREAN DLVR & POSTPARTUM  No date: PARATHYROIDECTOMY; Right  No date: TOE SURGERY; Left      Comment:  5 years ago  No date: TUBAL LIGATION        Medications  Current Facility-Administered Medications   Medication   . 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 & was positive, never             actually took it   Thiazide-type diure*    Other (See Comments)    Comment:Stopped due to low K+    Smoking, Alcohol, Drugs:  Social History    Tobacco Use      Smoking status: Former        Packs/day: 0.33        Years: 10.00        Additional pack years: 0.00        Total pack years: 3.30        Types: Cigarettes        Quit date: 10/15/2012        Years since quitting: 9.4      Smokeless tobacco: Never      Tobacco comments: quit 3 months ago on 10/15/12    Alcohol use: Yes      Comment: minimal    Drug use:   No       PMHx:  Past Medical History:  06/22/2017: Astigmatism of both eyes with presbyopia  No date: Diabetes mellitus ([place])  No date: History of high cholesterol  No date: History of hypertension  No date: HTN (hypertension)    Vitals  LMP 06/27/2019 (Approximate)     Review of Systems     Patient summary  reviewed and Nursing notes reviewed          Cardiovascular:  Positive for hypertension.   Physical Activity in METs 4    Pulmonary: Positive for obstructive sleep apnea.   GU/Renal: Negative for GU/renal diseases.    Hepatic: Skin negative for hepatic disease.    Neurological: Negative for neurological diseases.    Gastrointestinal: Negative for gastrointestinal diseases.    Hematological: Negative for hematological diseases.    Endocrine: Positive for Diabetes.   HEENT: Negative for HEENT disorders.    Musculoskeletal: Negative for musculoskeletal diseases.    Psychiatric: Negative for psychiatry diseases.  Constitutional: Negative for constitutional diseases.    Skin: Negative for skin diseases.        Physical Exam    General     Level of consciousness:  Alert and oriented (time, person, place)   BMI   BMI greater than 30 kg/m2   Airway     Mallampati:  III    TM distance:  >3 FB    Mouth opening:  >3 FB    Neck ROM:  Full   Teeth  - normal exam    }   Heart   - normal exam     Lungs - normal exam     Other findings: fillings                Pertinent Labs:   Lab Results   Component Value Date    NA 140 12/08/2021    K 4.2 12/08/2021    CREAT 0.8 12/08/2021    GLUCOSER 89 12/08/2021    WBC 10.5 11/21/2015    HCT 36.4 11/21/2015    PLTA 174 11/21/2015         Anesthesia Plan    ASA Score:     ASA:  3    Airway:      Mallampati:  III    Mouth opening:  >3 FB    Neck ROM:  Full    TM distance:  >3 FB     Plan: general    Other information:     EKG Reviewed: : Yes      Full Stomach Precaution:: No      Post-Plan::  PACU    Anesthesia Assessment and Plan:        Phone call placed to patient to take Trulicity on Friday 44/03/47 so that there would be 7 day gap before surgery on Friday 03/23/22.   Interpreter [phone].  This pt is having 3 procedures in 1 month. Trulicity hard to schedule but it is now planned for 1/6, 1/12 and 1/18 and PCP agrees. Held for more than a week.       Informed Consent:     Anesthetic plan  and risks discussed with:  Patient   Patient Consented        Attending Anesthesiologist Statement:     Reassessed day of surgery: Yes        Assessment made, necessary equipment and appropriate plan in place.  Surgical Assessment and Plan:     43F w/ T2DM, HTN.  L lower front tooth loose.  Patient denies during today's assessment that the tooth is loose.

## 2022-04-12 NOTE — Discharge Instructions (Signed)
DISCHARGE INSTRUCTIONS   AFTER WRIST AND HAND SURGERY    Surgical procedure performed today: Carpal Tunnel Release    Appointments: You will possibly be scheduled for 2 to 3 appointments with your Surgeon or PA.    Occupational therapy: Not everyone will need this. You may be scheduled to start therapy 3 to 4 days after surgery.  Keep this appointment as it will affect the result of your treatment.     1st appointment - With your surgeon or PA. will be about 2 weeks after surgery:  This is to check the incision and remove the stitches.  Depending on your procedure, you may be placed in a cast at that time.    2nd appointment - between 4 and 5 weeks after surgery.    To decrease swelling, rest with your operated arm raised up on pillows with  your hand higher than your heart.    Ice Therapy: Use ice packs to the hand and wrist 20 minutes every hour. No  ice for the rest of that hour. (20 minutes on/ 40 minutes off).    Keep moving your fingers on your operative hand to avoid swelling and  stiffness. Try to make a full fist.    You may remove your bulky bandage 48 hours after surgery. Cover the incision with a large band-ad. Keep the incision clean and dry. If the  bandage feels too tight before then, have someone loosen it up and rewrap it.    It is normal to have some pain and swelling after surgery. It will get better  over time. If you lie down and raise your hand on pillows, it will feel better.     The numbing medicine put into your arm will slowly wear off. Start taking recommended/prescribed pain medications as soon as you start to feel the pain. Take the  medicine as directed. Do NOT take more medicine than you are  supposed to. Take less as you feel better. Stop when you are ready.      Recommended dosing for ibuprofen and tylenol: Ibuprofen (advil, motrin) 600 mg every 6 hours as needed AND acetaminophen (tylenol) 1000 mg every 8 hours as needed.    Diet: Drink extra fluid the first night. Start you usual  diet when you feel like  eating again.    Bathing: Keep dressing and splint dry and out of the shower. Cover with a  plastic bag when you shower.     If you have a fever over 101 F, numbness or tingling that does not get better  each day, drainage that comes through the bandage,  nausea and vomiting  that lasts or pain that does not get better with prescribed medicine, call the  Office.        [facility]  [place], [place] [address]  [phone] [facility]  [place], [place] [address]  [phone] [facility]  [place], [place] [address]  [phone] [facility]  [address]  [place], [place] [address]  [phone]         After hours for all campuses (5:00 pm-8:30am) please call [phone].

## 2022-04-12 NOTE — Anesthesia Postprocedure Evaluation (Signed)
Anesthesia Post-Operative Evaluation Note    Patient: Amanda Cervantes           Procedure Summary       Date: 04/12/22 Room / Location: [place] OR 4 / [place] OR    Anesthesia Start: 0831 Anesthesia Stop:     Procedure: RELEASE, CARPAL TUNNEL (Left: Wrist) Diagnosis:       Left carpal tunnel syndrome      (Left carpal tunnel syndrome [G56.02])    Surgeons: Floyde Parkins, MD Responsible Provider: Zella Ball, MD    Anesthesia Type: MAC ASA Status: 3              POST-OPERATIVE EVALUATION    Anesthesia Post Evaluation    Vitals signs in patient's normal range: Yes  Respiratory function stable; airway patent: Yes  Cardiovascular function stable: Yes  Hydration status stable: Yes  Mental status recovered; patient participates in evaluation and/or is at baseline: Yes  Pain control satisfactory: Yes  Nausea and vomiting control satisfactory: YesProcedure was labor & delivery no  PostOP disposition PACU  Anesthesia Observation no significant observation    MIPS#404 Anesthesiology Smoking Abstinence:     The patient is a current smoker (e.g. cigarette, cigar, pipe, e-cigarette/vaping/marijuana) (Z6109): Yes   The patient underwent an elective surgery or procedure requiring anesthesia (U0454) : Yes   The patient received preop smoking cessation instructions prior to the day of surgery or procedure by MD, APC or RN proxy staff [phone]): No     The patient smoked the day of the procedure (B1478): No  FOR CODING USE ONLY: IF BLANK G9562    MIPS#477 Multimodal Pain Management:  Emergent - Exclusion case: No  Patient was administered multimodal pain management (two or more drugs and/or interventions excluding systemic opioids) in the perioperative period; occurring at some time between 6 hours prior to anesthesia start time until discharged from PACU (Z3086): Yes   FOR CODING USE ONLY: REASON NOT LISTED V7846    NGEX#528 Perioperative Temperature Management:  Anesthesia start to Anesthesia end time was 60 minutes or longer  (4256F): No   FOR CODING USE ONLY: REASON NOT LISTED U1324    MIPS#430 Adult Prevention of PONV:  Patient received an inhalational anesthetic (MW102): No  FOR CODING USE ONLY: REASON NOT LISTED V2536    MIPS#463 Pediatric Prevention of PONV:  Pediatric patient?: No  FOR CODING USE ONLY: REASON NOT LISTED U4403        e[employer] # [id number]        Last vitals    BP: 122/73 (04/12/2022  7:25 AM)  Temp: 98.1 F (36.7 C) (04/12/2022  7:25 AM)  Pulse: 61 (04/12/2022  7:25 AM)  Resp: 19 (04/12/2022  7:25 AM)  SpO2: 94 % (04/12/2022  7:25 AM)

## 2022-04-12 NOTE — Brief Op Note (Signed)
Brief Procedure or Operative Note    Procedure:  Procedure(s):  RELEASE, CARPAL TUNNEL    Pre-Procedure Diagnosis: Pre-op Diagnosis     * Left carpal tunnel syndrome [G56.02]     Post-Procedure Diagnosis: Post-op Diagnosis     * Left carpal tunnel syndrome [G56.02]    Surgeon: Primary: Floyde Parkins, MD    Assistant (if applicable):     Type of Anesthesia:  * No anesthesia type entered *    Findings:   There was thickening of the transverse carpal ligament.  There was an hourglass deformity of the median nerve.  There was a post release blush.  No masses present within the carpal canal.  Motor branch and superficial palmar arch were intact.  Minor hypertrophy of the flexor tenosynovium.    Estimated Blood Loss: 0 mL    Specimens Removed: No specimens were documented in this log.    Complications:  None    Other (e.g. Implants):  Nothing was implanted during the procedure   Peripheral IV 04/12/22 Right Antecubital fossa (Active)   Site Assessment Clean;Dry;Intact 04/12/22 0900   Line Status Infusing 04/12/22 0900   Dressing Status Clean;Dry;Intact 04/12/22 0900         Floyde Parkins, MD  Pager :                   .

## 2022-04-12 NOTE — Op Note (Signed)
Procedure(s):  RELEASE, CARPAL TUNNEL Operative Note     Date: 04/12/2022  Location: [facility]    Name: Amanda Cervantes, DOB: [birth year], MRN: [id number]    Diagnosis  Pre-op Diagnosis     * Left carpal tunnel syndrome [G56.02] Post-op Diagnosis     * Left carpal tunnel syndrome [G56.02]     Procedure:    RELEASE, CARPAL TUNNEL  CPT(R) Code:  09811 - PR NEUROPLASTY &/TRANSPOS MEDIAN NRV CARPAL TUNNE    Surgeon(s) and Role:     Floyde Parkins, MD - Primary    Procedure Summary  Anesthesia: * No anesthesia type entered *  ASA: ASA 3  Estimated Blood Loss: None  Total IV Fluids: 300 mL  Drains:  Peripheral IV 04/12/22 Right Antecubital fossa (Active)   Site Assessment Clean;Dry;Intact 04/12/22 0900   Line Status Infusing 04/12/22 0900   Dressing Status Clean;Dry;Intact 04/12/22 0900      Specimens: No specimens collected during this procedure.   Implant(s): Nothing was implanted during the procedure  Staff:   Circulator: Felizardo Hoffmann, RN; Inocencio Homes, RN  Surgical Tech: Elyn Peers    Indications: Amanda Cervantes is an 57 year old female who is having surgery for Left carpal tunnel syndrome [G56.02].    Procedure Details:  The patient was brought to the operating room and placed in a comfortable supine position on the operating table.  The left upper extremity was comfortably extended on a well-padded hand table.  An initial surgical safety check was led by the anesthesia team.  MAC anesthesia was now provided.  Using sterile technique local field block anesthetic was given to the volar aspect of the wrist using 1% plain lidocaine.  The arm was now prepped and draped sterilely.  A final surgical timeout was performed confirming proper patient, procedure to be performed, and laterality.  Lastly the arm was exsanguinated with an Esmarch dressing and the tourniquet inflated to 250 mmHg.     A longitudinal incision was made in the left palm beginning at the level of the wrist flexion crease extending in line  with the radial border of the ring finger.  The incision was carried down through the deep dermal layer.  Potential bleeding points were controlled throughout the course of the dissection with bipolar coagulation.  Sharp dissection continued down through the subcutaneous tissues and fibers of the superficial palmar fascia were incised.  The transverse carpal ligament was now identified.  The structure was now divided longitudinally again in line with the radial border of the ring finger and the canal entered.  On entering the canal hemostat was placed underneath the transverse carpal ligament to protect the contents of the canal. Under direct vision using sharp dissection the proximal fibers of the transverse carpal ligament and superficial palmar fascia fibers were divided. A grooved tissue protector was placed underneath the distal fibers of the volar forearm fascia.  The structure was now divided longitudinally under direct vision.  Attention was now turned distally. Again a hemostat was placed underneath the transverse carpal ligament to protect the contents of the canal.  Under direct vision and using sharp dissection the remaining distal fibers of the transverse carpal ligament and superficial palmar fascia fibers were divided. A complete release of the canal was assured based on both inspection and palpation.  The canal was carefully explored with blunt dissection and the findings noted below.     The wound was irrigated with normal saline solution.  Any other potential bleeding points were controlled with bipolar coagulation.  The skin edges were now reapproximated with 4-0 nylon.  Half percent plain Marcaine solution was given along the wound edges for postop pain control.  A bulky hand dressing was then applied and the tourniquet released.  The total tourniquet time was 16 minutes.     Preoperative antibiotics are not indicated.   Venous thrombosis prophylaxis are not indicated.    Findings: There was  thickening of the transverse carpal ligament.  There was an hourglass deformity of the median nerve.  There was a post release blush.  No masses present within the carpal canal.  Motor branch and superficial palmar arch were intact.  Minor hypertrophy of the flexor tenosynovium.    Complications:  None; patient tolerated the procedure well.     Disposition: PACU - hemodynamically stable.  Condition: stable    Floyde Parkins, MD  Phone Number: [phone]

## 2022-04-12 NOTE — Interval H&P Note (Signed)
Patient Assessment Update: (Fill out Prior to procedure or within 24 hours of  admission if H&P done pre-admission.)   Re-evaluation including history review and physical examination has been performed.    Patient's Condition No Change, NPO since before midnight with the exception of sips with meds, CHG x 2    Leanord Hawking, PA-C, 04/12/2022

## 2022-04-13 ENCOUNTER — Other Ambulatory Visit (HOSPITAL_BASED_OUTPATIENT_CLINIC_OR_DEPARTMENT_OTHER): Payer: Self-pay | Admitting: Internal Medicine

## 2022-04-14 NOTE — Telephone Encounter (Signed)
This prescription refill request has passed the Rx Renewal Authorization protocol.  This medication has been approved and sent to the patient's preferred pharmacy.      [facility] Pharmacy, Amanda Cervantes is a 57 year old female has requested a refill of metformin.      Last Office Visit: 02/20/2022 with PCP  Last Physical Exam: 02/22/2020    DM EYE EXAM due on 03/24/2022    Diabetic Med:   HEMOGLOBIN A1C (%)   Date Value   05/30/2020 8.3 (*H)       POC HEMOGLOBIN A1C (%)   Date Value   01/15/2022 6.3 (H)      CREATININE (mg/dL)   Date Value   12/08/2021 0.8       Documented patient preferred pharmacies:    [facility], [place]  Phone: [phone] Fax: [phone]

## 2022-04-19 NOTE — Progress Notes (Signed)
Presents for f/u primary hyperparathyroidism, previously followed by Dr. He  HPI:   Hyperparathyroidism  08/15/2021 - PT and calcium /high- switched from chlorthalidone 25 mg to amlodipine, 2.5 mg due to risk of hypercalcemia with thiazide diuretics.  7/23 endo - 24-hr urine calcium level elevated  8/23 Focal radiotracer uptake activity at 4 hours along the right-sided thyroid region which may represent an underlying parathyroid adenoma.  9/23 [place]: There is a 1.3 cm hypoechoic lesion posterior to the right lobe of the thyroid which could be a parathyroid  9/23 endo: Diagnoses clear, lesion localized to the right side  12/23 BMD T+0.4 spine, T-1.6 left FN, T-1.4 right FN, T +0.1 radius; FRAX 6.8% and 0.6%----> no tx needed  03/23/22 s/p parathyroidectomy    2. Adrenal nodule  10/23 CT: questionable minimal nodular thickening of the left adrenal gland unchanged since 2017: 9/23 hormonal eval negative     3. Multinodular goiter  9/23 [place] isthmus 1.0 cm hypoechoic nodule; right lobe 0.7 cm nodule and 1/3 cm nodule posterior to lobe c/w parathyroid; left lobe 0.5 cm nodule        PMH:   DM2  HTN  HLD  Diverticulitis  Osteopenia  CTS bilateral    MEDS:   Atorvastatin  Calcitriol  Calcium carbonate  D3 1610  Trulicity 1.5 mg/wk  Lisinpril  Metformin XR 1500 mg/d    ALLERGIES:   PCNs  Thiazides    ROS: feels well. No tingling, crampingor numbness of hands or feet. No breathing problems or CP. All other ROS reviewed and confirmed as negative.    PHYSICAL EXAMINATION.  BP 135/86 (Site: LA, Position: Sitting, Cuff Size: Lrg)   Pulse 76   Temp 98 F (36.7 C) (Temporal)   Wt 83.4 kg (183 lb 12.8 oz)   LMP 06/27/2019 (Approximate)   SpO2 98%   BMI 33.61 kg/m   Pain Score: Data Unavailable  Body mass index is 33.61 kg/m.  No acute distress. Well developed, well-nourished.   Eyes: Full extraocular movements. .   Neck: Negative for goiter. No cervical lymphadenopathy appreciated.   Cardiovascular: Regular rate and rhythm, normal  S1 and S2. No murmur/gallop/rub heard.  Respiratory: Clear to auscultation and percussion. Negative for respiratory distress/rales.   Musculoskeletal: Without clubbing, cyanosis or edema.   Skin: Cool and dry to touch without obvious lesions.   Neurological: Intact grossly. Cvostek's negative  Psychiatric: Normal affect and responsiveness to questions.           PATH:   -477 mg hypercellular parathyroid.  See note   Note: Hypercellular parathyroid with normal rim ; features most consistent   with parathyroid adenoma (655 mg, total weight) in the appropriate clinical   context.  Clinical correlation is required.       Component      Latest Ref Rng 03/23/2022  12:52 PM   INTRAOPERATIVE INTACT PTH      15 - 65 pg/mL 22        PTH INTACT (pg/mL)   Date Value   03/23/2022 55   10/03/2021 38   08/07/2021 38     CALCIUM   Date Value   12/08/2021 11.2 mg/dL (H)   10/03/2021 11.2 mg/dL (H)   10/20/2020 11.5 mg/dl (H)     CREATININE (mg/dL)   Date Value   12/08/2021 0.8   10/03/2021 1.0   10/20/2020 1.0     VITAMIN D,25 HYDROXY (ng/mL)   Date Value   01/29/2022 45   08/07/2021 53  12/10/2019 47     Component      Latest Ref Rng 12/08/2021  11:07 AM 12/11/2021  9:01 AM   ALDOSTERONE      0.0 - 30.0 ng/dL 4.4     RENIN PLASMA ACTIVITY      0.167 - 5.380 ng/mL/hr 0.699     ALDOSTERONE/RENIN RATIO      0.0 - 30.0  6.3     NORMETANEPHRINE, PL      0.0 - 244.0 pg/mL 128.7     METANEPHRINE,PL      0.0 - 88.0 pg/mL 23.6     DEXAMETHASONE      . ng/dL  498    CORTISOL AM POST DEXAMETHASONE      0.00 - 5.00 ug/dL  1.62        (E21.0) Primary hyperparathyroidism (HCC) s/p surgery  (primary encounter diagnosis)  Comment: resolved  Plan: PTH INTACT WITH CALCIUM            (E27.8) 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([facility])  Comment: work up negative in 9/23, imaging stable 10/23  Plan: consider repeat imaging in 3-5 years    (E04.2) Multinodular goiter  Comment: smalll nodules; however the isthmic nodule may require FNA if still 1.0 cm or larger on next  [place]  Plan: US THYROID in 8/24, f/u after    I spent a total of 35 minutes on this visit on the date of service (total time includes all activities performed on the date of service)

## 2022-04-20 ENCOUNTER — Ambulatory Visit: Payer: No Typology Code available for payment source | Attending: Endocrinology | Admitting: Endocrinology

## 2022-04-20 ENCOUNTER — Other Ambulatory Visit (HOSPITAL_BASED_OUTPATIENT_CLINIC_OR_DEPARTMENT_OTHER): Payer: Self-pay | Admitting: Endocrinology

## 2022-04-20 ENCOUNTER — Encounter (HOSPITAL_BASED_OUTPATIENT_CLINIC_OR_DEPARTMENT_OTHER): Payer: Self-pay | Admitting: Endocrinology

## 2022-04-20 ENCOUNTER — Ambulatory Visit
Admission: RE | Admit: 2022-04-20 | Discharge: 2022-04-20 | Disposition: A | Discharge status: Home / Self Care | Payer: No Typology Code available for payment source | Source: Ambulatory Visit | Attending: Endocrinology

## 2022-04-20 ENCOUNTER — Other Ambulatory Visit: Payer: Self-pay

## 2022-04-20 VITALS — BP 135/86 | HR 76 | Temp 98.0°F | Wt 183.8 lb

## 2022-04-20 DIAGNOSIS — E042 Nontoxic multinodular goiter: Secondary | ICD-10-CM | POA: Diagnosis not present

## 2022-04-20 DIAGNOSIS — E210 Primary hyperparathyroidism: Secondary | ICD-10-CM | POA: Insufficient documentation

## 2022-04-20 DIAGNOSIS — E278 Other specified disorders of adrenal gland: Secondary | ICD-10-CM | POA: Diagnosis not present

## 2022-04-20 LAB — PTH INTACT WITH CALCIUM
PTH INTACT CALCIUM: 10.9 mg/dL — ABNORMAL HIGH (ref 8.5–10.5)
PTH INTACT: 12 pg/mL — ABNORMAL LOW (ref 15–65)

## 2022-04-20 MED ORDER — CALCIUM CARBONATE 1250 (500 CA) MG PO TABS
1.00 | ORAL_TABLET | Freq: Every day | ORAL | 3 refills | Status: AC
Start: 2022-04-20 — End: 2023-04-20

## 2022-04-20 NOTE — Progress Notes (Signed)
PT safe at home DJ

## 2022-04-22 ENCOUNTER — Other Ambulatory Visit (HOSPITAL_BASED_OUTPATIENT_CLINIC_OR_DEPARTMENT_OTHER): Payer: Self-pay | Admitting: Internal Medicine

## 2022-04-24 ENCOUNTER — Telehealth (HOSPITAL_BASED_OUTPATIENT_CLINIC_OR_DEPARTMENT_OTHER): Payer: Self-pay | Admitting: General Practice

## 2022-04-24 NOTE — Telephone Encounter (Signed)
Called patient via telephone interpreter ([place]). Results and plan of care discussed as noted below. Lab scheduling information provided. Patient confirmed understanding and had no further questions.     Hanley Ben, MD  [facility]  Call pt:  calcium is a bit high, but I  think this is from taking the calcium supplements 4x/d and the calcitriol . I'd like her to repeat the blood tests in 1 month -order placed

## 2022-04-24 NOTE — Telephone Encounter (Signed)
-----   Message from Hanley Ben, MD sent at 04/20/2022  6:38 PM EST -----  Call pt:   calcium is a bit high, but I  think this is from taking the calcium supplements 4x/d and the calcitriol . I'd like her to repeat the blood tests in 1 month -order placed

## 2022-04-25 ENCOUNTER — Other Ambulatory Visit: Payer: Self-pay

## 2022-04-25 ENCOUNTER — Ambulatory Visit: Payer: No Typology Code available for payment source | Attending: Specialist | Admitting: Specialist

## 2022-04-25 DIAGNOSIS — Z9889 Other specified postprocedural states: Secondary | ICD-10-CM

## 2022-04-25 DIAGNOSIS — G5601 Carpal tunnel syndrome, right upper limb: Secondary | ICD-10-CM | POA: Diagnosis present

## 2022-04-25 DIAGNOSIS — G5602 Carpal tunnel syndrome, left upper limb: Secondary | ICD-10-CM | POA: Insufficient documentation

## 2022-04-25 NOTE — Patient Instructions (Signed)
On the right side you can start massaging the scar with some hand cream 2 or 3 times a day.  You can use your right hand in a normal manner.  On the left side please use the gel on the very end of the wound twice a day.  You should keep the wound covered with a Band-Aid for the next week or so.  The wound will close up over the next week.  Once it is closed you can start some scar massage with some cream on the left 2.  Also you can return to your normal activity with the left hand.    If you have any problems down the line please contact the office to be rechecked.

## 2022-04-25 NOTE — Progress Notes (Signed)
This patient returns regarding her recent carpal tunnel releases.  She is about 6 weeks status post carpal tunnel release right and 2 weeks status post carpal tunnel release left.    She has had resolution of numbness and tingling in the hands.  No nocturnal symptoms.  She is gaining a little confidence with motion of the hand and wrist.    She has had some concern about the wound on the left side.  She notes that the wound is a little bit open.  No drainage.    On examination of the left hand palm wound is healing well although there is slight gapping of the superficial dermis at the distal extent.  No surrounding erythema.  No drainage.  No tenderness.  She make a good fist and extend the digits well.  Thenar contour and strength normal.  Light touch sensibility normal.    On the right she has a well-healed palm scar overlying the carpal canal.  Moderate scar thickening.  No untoward sensitivity.  Full mobility of all digits.  Light touch sensibility normal.    1.  Progressing well after carpal tunnel release left.  She has minor gapping of the wound distally.  This will be addressed with use of hydrogel and a Band-Aid.  She should close up within a week or so.  Instructed as to its use.  2.  Scar hypertrophy after carpal tunnel release right.  No findings of distinct pillar pain.  She will start a scar manipulation program for the right.  Also, once the wound is healed up she will do so on the left.    Resolution of symptoms early on especially nocturnal symptoms is good prognostic sign and I have communicated that to the patient.    Follow-up as needed.

## 2022-05-21 ENCOUNTER — Ambulatory Visit
Admission: RE | Admit: 2022-05-21 | Discharge: 2022-05-21 | Disposition: A | Discharge status: Home / Self Care | Payer: No Typology Code available for payment source | Attending: Endocrinology | Admitting: Endocrinology

## 2022-05-21 ENCOUNTER — Other Ambulatory Visit: Payer: Self-pay

## 2022-05-21 DIAGNOSIS — E210 Primary hyperparathyroidism: Secondary | ICD-10-CM | POA: Insufficient documentation

## 2022-05-21 LAB — PTH INTACT WITH CALCIUM
PTH INTACT CALCIUM: 10 mg/dL (ref 8.5–10.5)
PTH INTACT: 16 pg/mL (ref 15–65)

## 2022-05-21 LAB — CALCIUM: CALCIUM: 10 mg/dL (ref 8.5–10.5)

## 2022-05-24 ENCOUNTER — Telehealth (HOSPITAL_BASED_OUTPATIENT_CLINIC_OR_DEPARTMENT_OTHER): Payer: Self-pay

## 2022-05-24 NOTE — Telephone Encounter (Signed)
Called patient as noted below. Translation provided by this RN. Patient confirmed understanding and denies further questions at this time.           Amanda Ben, MD  [facility]  Please call pt  Her calcium and parathyroid levels are both normal, so the parathyroid surgery was successful at curing the high calcium

## 2022-06-07 ENCOUNTER — Encounter (HOSPITAL_BASED_OUTPATIENT_CLINIC_OR_DEPARTMENT_OTHER): Payer: Self-pay

## 2022-06-08 ENCOUNTER — Telehealth (HOSPITAL_BASED_OUTPATIENT_CLINIC_OR_DEPARTMENT_OTHER): Payer: Self-pay

## 2022-06-08 NOTE — Telephone Encounter (Signed)
DOS 03/23/22  Spoke to pt with interp.  Very thankful for the call and the surgery, doing so much better. No complications  Larey Seat, 06/08/2022

## 2022-06-16 ENCOUNTER — Other Ambulatory Visit (HOSPITAL_BASED_OUTPATIENT_CLINIC_OR_DEPARTMENT_OTHER): Payer: Self-pay | Admitting: Internal Medicine

## 2022-06-16 NOTE — Telephone Encounter (Signed)
[facility] Pharmacy, Amanda Cervantes is a 57 year old female has requested a refill of test strips.      Last Office Visit: 02/20/2022 with PCP  Last Physical Exam: 02/22/2020    DM EYE EXAM due on 03/24/2022  A1C due on 07/17/2022    Other Med Adult:  Most Recent BP Reading(s)  04/20/22 : 135/86        Cholesterol (mg/dL)   Date Value   01/15/2022 116     LOW DENSITY LIPOPROTEIN DIRECT (mg/dL)   Date Value   01/15/2022 69     HIGH DENSITY LIPOPROTEIN (mg/dL)   Date Value   01/15/2022 43     TRIGLYCERIDES (mg/dL)   Date Value   01/15/2022 109         THYROID SCREEN TSH REFLEX FT4 (uIU/mL)   Date Value   08/10/2021 1.950         No results found for: "TSH"    HEMOGLOBIN A1C (%)   Date Value   05/30/2020 8.3 (*H)       POC HEMOGLOBIN A1C (%)   Date Value   01/15/2022 6.3 (H)         No results found for: "INR"    SODIUM (mmol/L)   Date Value   12/08/2021 140       POTASSIUM (mmol/L)   Date Value   12/08/2021 4.2           CREATININE (mg/dL)   Date Value   12/08/2021 0.8       Documented patient preferred pharmacies:    [facility], [place] [address]  Phone: [phone] Fax: [phone]

## 2022-07-16 ENCOUNTER — Other Ambulatory Visit: Payer: Self-pay

## 2022-07-16 ENCOUNTER — Ambulatory Visit: Payer: No Typology Code available for payment source | Attending: Internal Medicine | Admitting: Pharmacist

## 2022-07-16 VITALS — BP 130/88 | HR 76

## 2022-07-16 DIAGNOSIS — I10 Essential (primary) hypertension: Secondary | ICD-10-CM | POA: Diagnosis present

## 2022-07-16 DIAGNOSIS — E119 Type 2 diabetes mellitus without complications: Secondary | ICD-10-CM | POA: Diagnosis present

## 2022-07-16 DIAGNOSIS — E1169 Type 2 diabetes mellitus with other specified complication: Secondary | ICD-10-CM

## 2022-07-16 DIAGNOSIS — E785 Hyperlipidemia, unspecified: Secondary | ICD-10-CM | POA: Diagnosis present

## 2022-07-16 LAB — POC A1C: POC HEMOGLOBIN A1C: 6.6 % — ABNORMAL HIGH (ref 4–5.6)

## 2022-07-16 MED ORDER — TRULICITY 1.5 MG/0.5ML SC SOPN
1.50 mg | PEN_INJECTOR | SUBCUTANEOUS | 11 refills | Status: AC
Start: 2022-07-16 — End: 2023-07-16

## 2022-07-16 NOTE — Progress Notes (Signed)
Pharmacotherapy Diabetes Management Visit      Translator used for this visit: No, Patient declined.    Amanda Cervantes is a 57 year old female with type 2 diabetes who presents to pharmacotherapy for follow-up diabetes management.       Medication reconciliation was completed at the beginning of the visit verbally.  Patient reports (+) adherence to all medications including:    Diabetes Regimen:  Metformin XR 1500 mg daily, however she stopped it 2 months ago d/t GI upset  Trulicity 1.5 mg weekly       Hypertension Regimen:  Amlodipine 10 mg daily  Lisinopril 40 mg daily        Past HTN medications tried (obtained from chart review):   Chlorthalidone - d/c's d/t hypercalcemia       Lipid Regimen:  Atorvastatin 10 mg daily        S/sx of hypoglycemia: denies    S/sx of hyperglycemia: denies    Other symptoms/complaints: none        SMBG: Patient tests occasionally fasting.  Patient reports BG readings around 99.      BP Self-monitoring: No, per the patient's preference.       Diet: Did not discuss due to time constraints.        Exercise: Did not discuss due to time constraints.        Blood Sugar  Lab Results   Component Value Date    HGBA1C 8.3 (*H) 05/30/2020    POCA1C 6.6 (H) 07/16/2022    GLUCOSER 89 12/08/2021    FINGERSTICKR 124 04/12/2022     Cholesterol  Lab Results   Component Value Date    CHOLESTEROL 116 01/15/2022    TG 109 01/15/2022    HDL 43 01/15/2022    LDL 69 01/15/2022           Kidney Function  Lab Results   Component Value Date    BUN 15 12/08/2021    CREAT 0.8 12/08/2021    GFR > 60 12/08/2021    ALBCREATR TNP 01/15/2022     Liver Function  Lab Results   Component Value Date    AST 17 07/11/2015    ALT 30 07/11/2015           Electrolytes  Lab Results   Component Value Date    NA 140 12/08/2021    K 4.2 12/08/2021    CL 107 12/08/2021    CA 10.0 05/21/2022     Blood Pressure    Most Recent BP Reading(s)  07/16/22 : 130/88  04/20/22 : 135/86  04/12/22 : 131/86     Heart Rate  Most Recent Pulse  Reading(s)  07/16/22 : 76  04/20/22 : 76  04/12/22 : 62                   Assessment/Recommendations:  Diabetes  Today's A1c was at goal (< 8%) at 6.6 %. Next due: 3 months   Last urine albumin/SCr ratio: negative for albuminuria.    Interventions: d/c metformin and continue Trulicity 1.5mg  weekly  SMBG: Encouraged the patient to check BG two to three times a week  Advised patient to call clinic prior to next appt for med adjustments should they experience readings <70 or >300      Hypertension  Blood pressure is near goal (<130/80 per 2023 ADA guidelines)  Interventions:   Continue amlodipine 10 mg daily  Continue lisinopril 40 mg daily  Continue metoprolol XL 50  mg daily    Lipids  LDL goal per 2023 ADA guidelines is <70 mg/dl for patients aged 16-10 years at an increased cardiovascular risk  Interventions: continue atorvastatin 10 mg daily          Education:  Importance of medication and visit compliance  A1c/SMBG goals  Blood pressure goals  Diet and exercise as a means of controlling BG, BP, and lipids          Planned follow-up date: 10/24/22.   Patient reports she is traveling to [place] in about a month and plans to return in 2 months.       Instructed patient to call clinic or go to the ER if they are experiencing unusual symptoms.    Patient verbalized understanding of everything discussed and agrees with plan. Patient was given the opportunity to ask questions/voice concerns.    Majority of this visit was spent counseling the patient regarding medication management and chronic disease education.    Pharmacotherapy tracking for: Diabetes, Hyperlipidemia, and Hypertension/Elevated BP

## 2022-10-24 ENCOUNTER — Institutional Professional Consult (permissible substitution) (HOSPITAL_BASED_OUTPATIENT_CLINIC_OR_DEPARTMENT_OTHER): Payer: No Typology Code available for payment source | Admitting: Pharmacist

## 2022-11-01 ENCOUNTER — Telehealth (HOSPITAL_BASED_OUTPATIENT_CLINIC_OR_DEPARTMENT_OTHER): Payer: Self-pay | Admitting: Pharmacist

## 2022-11-01 NOTE — Telephone Encounter (Signed)
Outreach    Date of missed visit: 10/24/22    Attempt: 1st    For in person visit     Outcome:   Left message to schedule via clinic    Should pt return my call:  Please schedule with [facility] pharmacotherapy scheduling subgroup, rx consult 30

## 2022-11-15 ENCOUNTER — Telehealth (HOSPITAL_BASED_OUTPATIENT_CLINIC_OR_DEPARTMENT_OTHER): Payer: Self-pay | Admitting: Pharmacist

## 2022-11-15 ENCOUNTER — Encounter (HOSPITAL_BASED_OUTPATIENT_CLINIC_OR_DEPARTMENT_OTHER): Payer: Self-pay

## 2022-11-15 NOTE — Telephone Encounter (Signed)
Diabetes and Outreach    Date of missed visit: 10/24/22    Attempt: 2nd    For in person visit     Outcome:   Left message to schedule via clinic and Letter sent     Should pt return my call:  Please schedule with [facility] pharmacotherapy scheduling subgroup, rx consult 30

## 2022-11-20 ENCOUNTER — Inpatient Hospital Stay (HOSPITAL_BASED_OUTPATIENT_CLINIC_OR_DEPARTMENT_OTHER): Admission: RE | Admit: 2022-11-20 | Payer: No Typology Code available for payment source | Source: Ambulatory Visit

## 2022-11-26 ENCOUNTER — Ambulatory Visit (HOSPITAL_BASED_OUTPATIENT_CLINIC_OR_DEPARTMENT_OTHER): Payer: No Typology Code available for payment source | Admitting: Endocrinology

## 2023-01-14 ENCOUNTER — Encounter (HOSPITAL_BASED_OUTPATIENT_CLINIC_OR_DEPARTMENT_OTHER): Payer: Self-pay | Admitting: Internal Medicine

## 2023-01-23 IMAGING — MR JOELHO ESQUERDO
4 of 7 series · 19 of 40 positions shown · non-contrast
Comparison: none

[Series 4: T2 fat-sat · sagittal · 3.5mm · 0.33mm/px · 6 of 24 slices shown (1 of 3)]
[im 1/24]
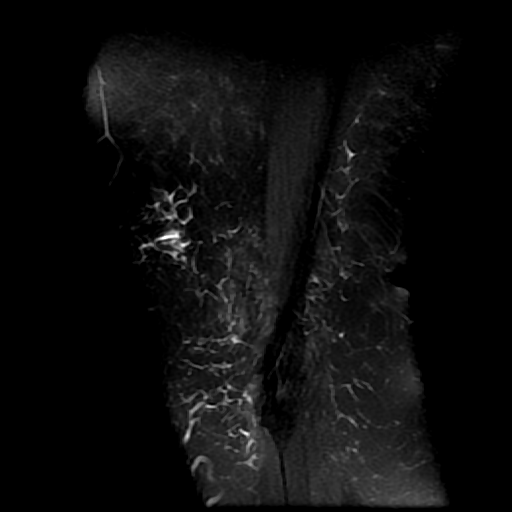
[im 5/24]
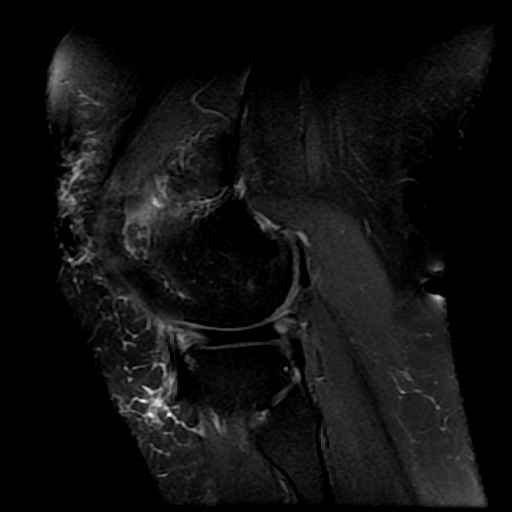
[im 10/24]
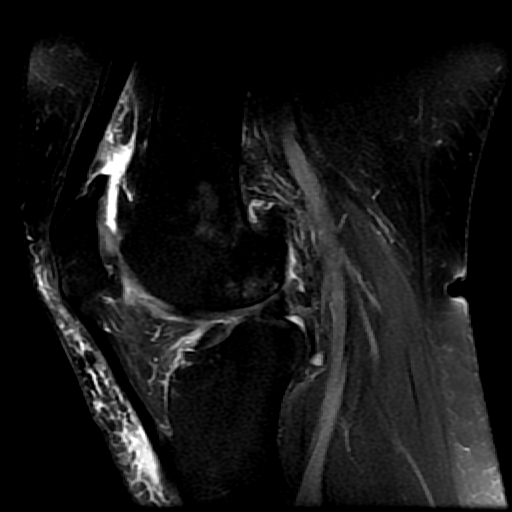
[im 14/24]
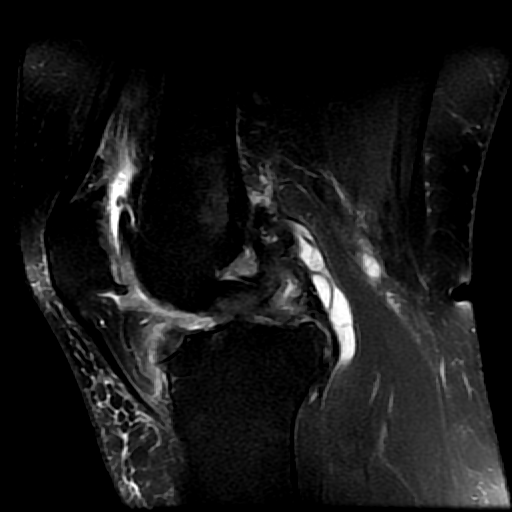
[im 19/24]
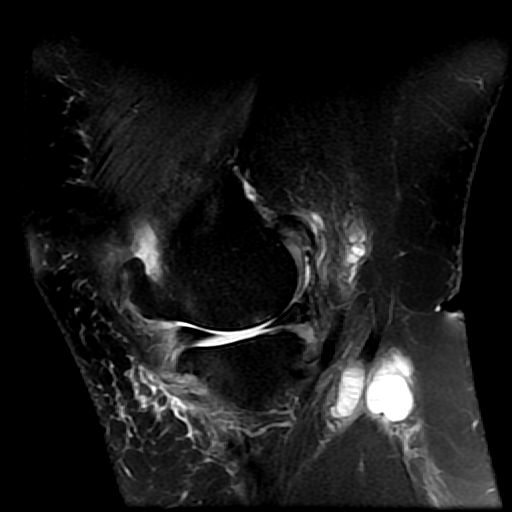
[im 24/24]
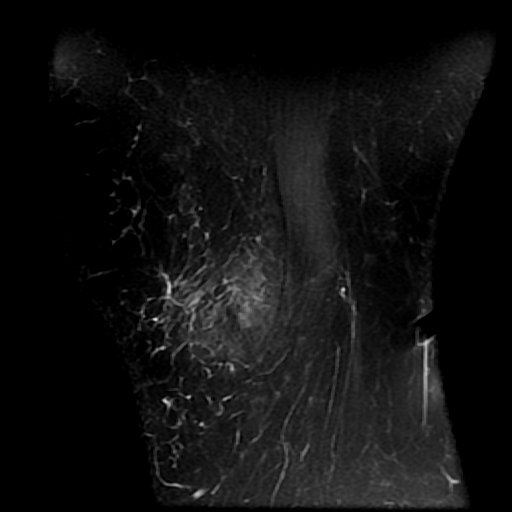

[Series 5: T2 fat-sat · coronal · 3.5mm · 0.33mm/px · 7 of 30 slices shown (2 of 3)]
[im 1/30]
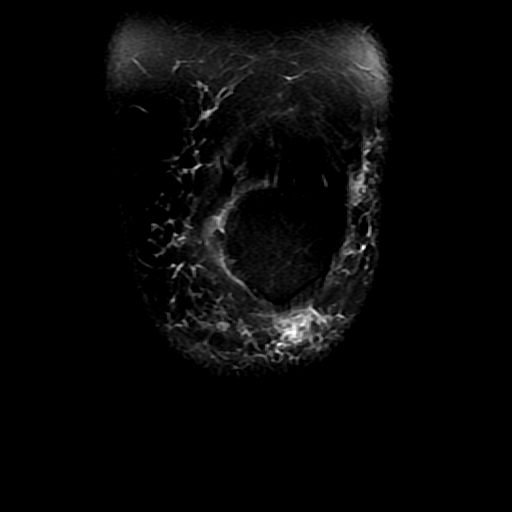
[im 5/30]
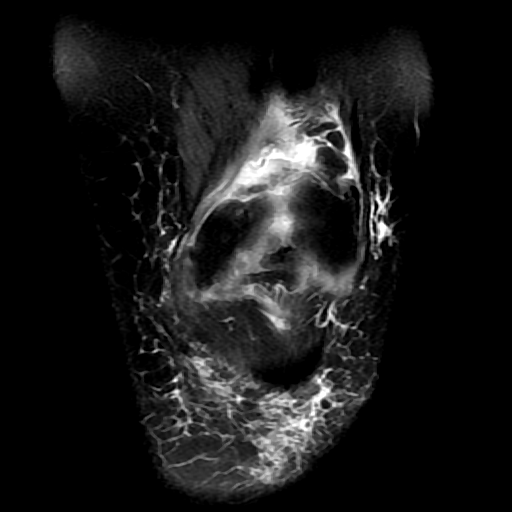
[im 10/30]
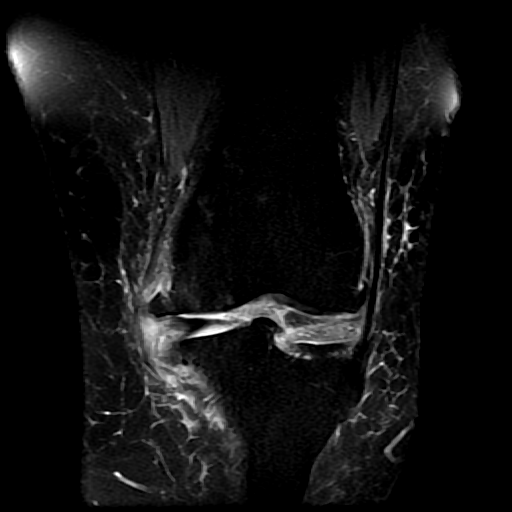
[im 15/30]
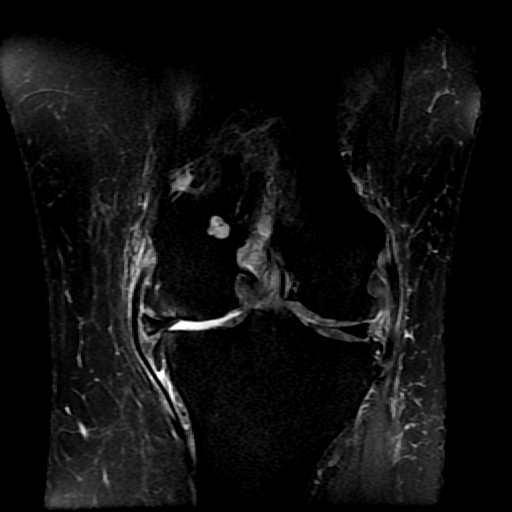
[im 20/30]
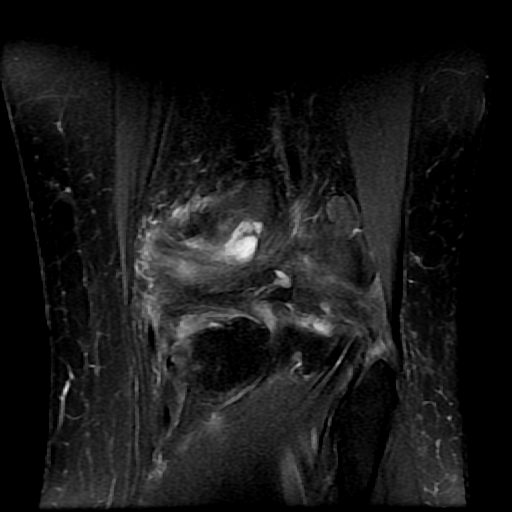
[im 25/30]
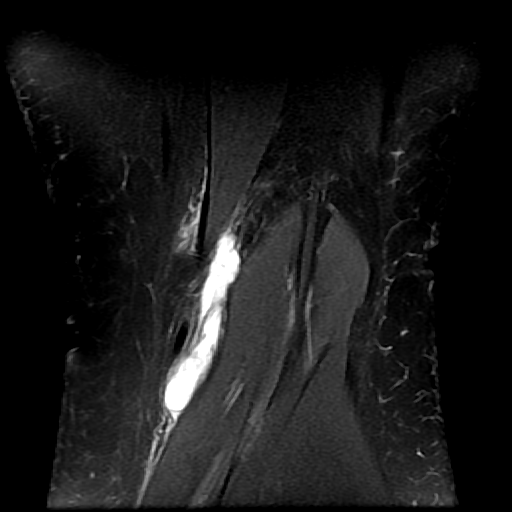
[im 30/30]
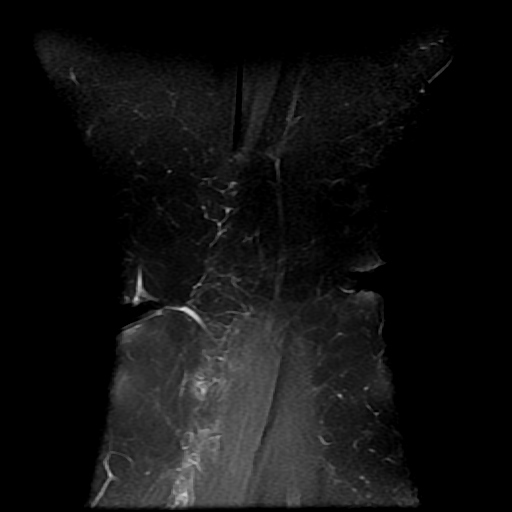

[Series 6: T2 fat-sat · axial · 3.5mm · 0.33mm/px · z∈[-45,+35]mm · 3 of 30 slices shown (3 of 3)]
[im 5/30]
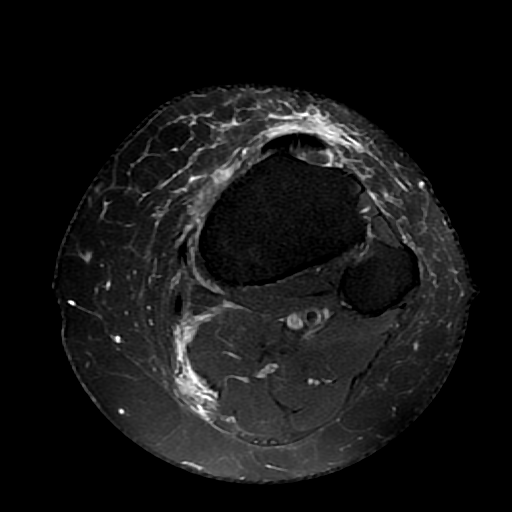
[im 15/30]
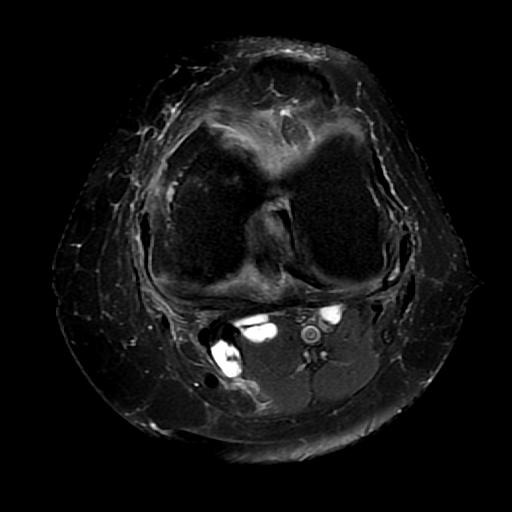
[im 25/30]
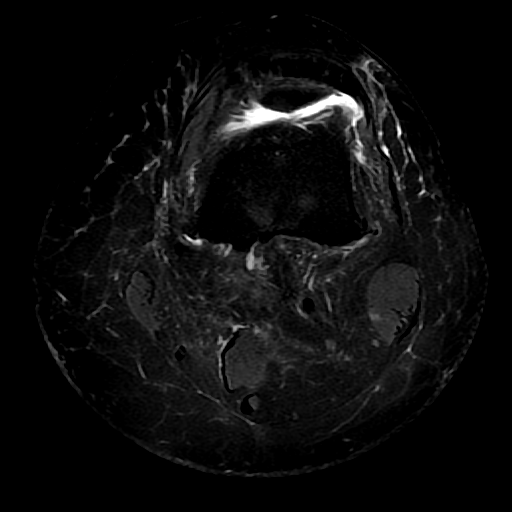

[Series 9: T1 · coronal · 3.5mm · 0.33mm/px · 3 of 30 slices shown]
[im 5/30]
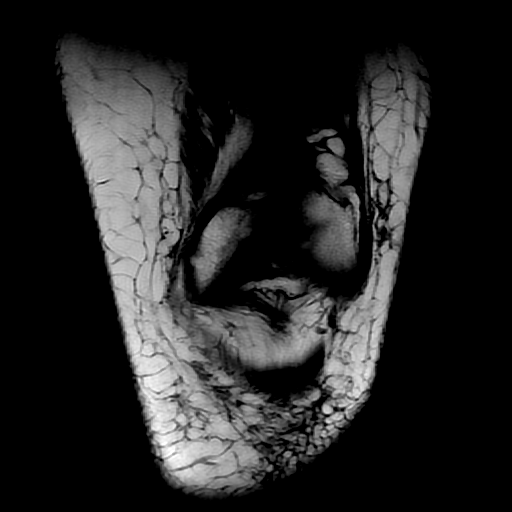
[im 15/30]
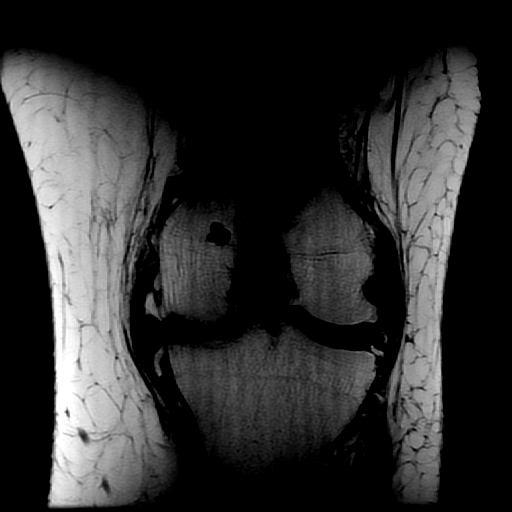
[im 25/30]
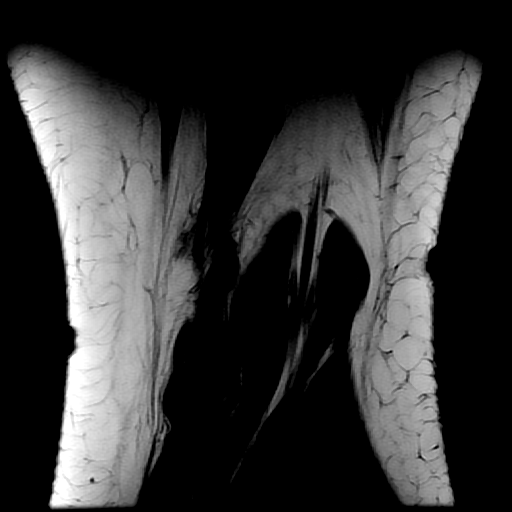

[19 of 40 positions shown; findings below may reference images not displayed]

RESSONÂNCIA MAGNÉTICA DO JOELHO ESQUERDO

TÉCNICA:

Exame realizado em equipamento de ressonância magnética com sequências, ponderações e planos específicos para o segmento de interesse, sem a administração endovenosa do meio de contraste.

RESULTADO:
Artropatia degenerativa tricompartimental do joelho caracterizada por afilamento com irregularidade condral além de osteófitos marginais, proeminente no compartimento femorotibial medial onde há exposição da cortical óssea e edema ósseo subcondral.
Menisco lateral apresentando sinais degenerativos intrassubstanciais discretos.
Rotura irregular extensa da raiz posterior do menisco medial com degeneração do corpo que se apresenta em extrusão articular além de rotura oblíqua na transição entre o corpo e o corno anterior.
Ligamentos cruzados e colaterais íntegros, com sinais degenerativos.
Tendão quadríceps e ligamento patelar sem alterações.
Pequena quantidade de líquido articular no joelho.
Cisto de Baker com septos de permeio e discreta distensão líquida, sem evidência de roturas, medindo 5,3 x 1,1 cm.
Edema do subcutâneo do joelho anterior.

CONCLUSÃO:
Artropatia degenerativa tricompartimental do joelho com líquido articular, proeminente no compartimento femorotibial medial.

Menisco lateral apresentando sinais degenerativos intrassubstanciais discretos.
Rotura irregular extensa da raiz posterior do menisco medial com degeneração do corpo que se apresenta em extrusão articular além de rotura oblíqua na transição entre o corpo e o corno anterior.
Cisto de Baker com septos de permeio e discreta distensão líquida, sem evidência de roturas.

## 2023-02-27 ENCOUNTER — Other Ambulatory Visit: Payer: Self-pay | Admitting: Internal Medicine

## 2023-06-20 ENCOUNTER — Other Ambulatory Visit: Payer: Self-pay | Admitting: Internal Medicine

## 2023-06-21 DIAGNOSIS — Z91199 Patient's noncompliance with other medical treatment and regimen due to unspecified reason: Secondary | ICD-10-CM | POA: Insufficient documentation
